# Patient Record
Sex: Female | Born: 1950 | Race: White | Hispanic: No | Smoking: Current every day smoker
Health system: Southern US, Community
[De-identification: ages and names within clinical notes are randomized; demographics above are authoritative.]

## PROBLEM LIST (undated history)

## (undated) DIAGNOSIS — D126 Benign neoplasm of colon, unspecified: Secondary | ICD-10-CM

## (undated) DIAGNOSIS — G8929 Other chronic pain: Secondary | ICD-10-CM

## (undated) DIAGNOSIS — F329 Major depressive disorder, single episode, unspecified: Secondary | ICD-10-CM

## (undated) DIAGNOSIS — R0600 Dyspnea, unspecified: Secondary | ICD-10-CM

## (undated) DIAGNOSIS — J449 Chronic obstructive pulmonary disease, unspecified: Secondary | ICD-10-CM

## (undated) DIAGNOSIS — F32A Depression, unspecified: Secondary | ICD-10-CM

## (undated) DIAGNOSIS — R51 Headache: Secondary | ICD-10-CM

## (undated) DIAGNOSIS — K222 Esophageal obstruction: Secondary | ICD-10-CM

## (undated) DIAGNOSIS — F419 Anxiety disorder, unspecified: Secondary | ICD-10-CM

## (undated) DIAGNOSIS — A809 Acute poliomyelitis, unspecified: Secondary | ICD-10-CM

## (undated) DIAGNOSIS — G14 Postpolio syndrome: Secondary | ICD-10-CM

## (undated) DIAGNOSIS — M1990 Unspecified osteoarthritis, unspecified site: Secondary | ICD-10-CM

## (undated) DIAGNOSIS — R011 Cardiac murmur, unspecified: Secondary | ICD-10-CM

## (undated) DIAGNOSIS — M797 Fibromyalgia: Secondary | ICD-10-CM

## (undated) DIAGNOSIS — I10 Essential (primary) hypertension: Secondary | ICD-10-CM

## (undated) DIAGNOSIS — K219 Gastro-esophageal reflux disease without esophagitis: Secondary | ICD-10-CM

## (undated) HISTORY — PX: ABDOMINAL HYSTERECTOMY: SHX81

## (undated) HISTORY — DX: Headache: R51

## (undated) HISTORY — DX: Chronic obstructive pulmonary disease, unspecified: J44.9

## (undated) HISTORY — DX: Acute poliomyelitis, unspecified: A80.9

## (undated) HISTORY — DX: Esophageal obstruction: K22.2

## (undated) HISTORY — PX: PARTIAL HYSTERECTOMY: SHX80

## (undated) HISTORY — DX: Anxiety disorder, unspecified: F41.9

## (undated) HISTORY — DX: Benign neoplasm of colon, unspecified: D12.6

## (undated) HISTORY — DX: Other chronic pain: G89.29

---

## 2001-09-25 ENCOUNTER — Emergency Department (HOSPITAL_COMMUNITY): Admission: EM | Admit: 2001-09-25 | Discharge: 2001-09-26 | Payer: Self-pay | Admitting: Emergency Medicine

## 2001-09-26 ENCOUNTER — Encounter: Payer: Self-pay | Admitting: Emergency Medicine

## 2002-11-26 ENCOUNTER — Encounter (HOSPITAL_COMMUNITY): Admission: RE | Admit: 2002-11-26 | Discharge: 2002-12-26 | Payer: Self-pay | Admitting: Family Medicine

## 2002-12-02 ENCOUNTER — Encounter: Payer: Self-pay | Admitting: Family Medicine

## 2002-12-12 ENCOUNTER — Encounter: Payer: Self-pay | Admitting: *Deleted

## 2002-12-12 ENCOUNTER — Ambulatory Visit (HOSPITAL_COMMUNITY): Admission: RE | Admit: 2002-12-12 | Discharge: 2002-12-12 | Payer: Self-pay | Admitting: *Deleted

## 2002-12-18 ENCOUNTER — Ambulatory Visit (HOSPITAL_COMMUNITY): Admission: RE | Admit: 2002-12-18 | Discharge: 2002-12-18 | Payer: Self-pay | Admitting: *Deleted

## 2003-11-17 ENCOUNTER — Ambulatory Visit (HOSPITAL_COMMUNITY): Admission: RE | Admit: 2003-11-17 | Discharge: 2003-11-17 | Payer: Self-pay | Admitting: Family Medicine

## 2004-07-03 ENCOUNTER — Emergency Department (HOSPITAL_COMMUNITY): Admission: EM | Admit: 2004-07-03 | Discharge: 2004-07-03 | Payer: Self-pay | Admitting: Emergency Medicine

## 2004-10-23 ENCOUNTER — Emergency Department (HOSPITAL_COMMUNITY): Admission: EM | Admit: 2004-10-23 | Discharge: 2004-10-23 | Payer: Self-pay | Admitting: Emergency Medicine

## 2005-02-15 ENCOUNTER — Ambulatory Visit (HOSPITAL_COMMUNITY): Admission: RE | Admit: 2005-02-15 | Discharge: 2005-02-15 | Payer: Self-pay | Admitting: Family Medicine

## 2005-02-17 ENCOUNTER — Ambulatory Visit (HOSPITAL_COMMUNITY): Admission: RE | Admit: 2005-02-17 | Discharge: 2005-02-17 | Payer: Self-pay | Admitting: Family Medicine

## 2005-10-02 DIAGNOSIS — D126 Benign neoplasm of colon, unspecified: Secondary | ICD-10-CM

## 2005-10-02 HISTORY — PX: COLONOSCOPY: SHX174

## 2005-10-02 HISTORY — DX: Benign neoplasm of colon, unspecified: D12.6

## 2006-05-29 ENCOUNTER — Ambulatory Visit (HOSPITAL_COMMUNITY): Admission: RE | Admit: 2006-05-29 | Discharge: 2006-05-29 | Payer: Self-pay | Admitting: Family Medicine

## 2006-08-03 ENCOUNTER — Encounter (INDEPENDENT_AMBULATORY_CARE_PROVIDER_SITE_OTHER): Payer: Self-pay | Admitting: Specialist

## 2006-08-03 ENCOUNTER — Ambulatory Visit (HOSPITAL_COMMUNITY): Admission: RE | Admit: 2006-08-03 | Discharge: 2006-08-03 | Payer: Self-pay | Admitting: Internal Medicine

## 2006-08-03 ENCOUNTER — Ambulatory Visit: Payer: Self-pay | Admitting: Internal Medicine

## 2009-07-02 DIAGNOSIS — K222 Esophageal obstruction: Secondary | ICD-10-CM

## 2009-07-02 HISTORY — DX: Esophageal obstruction: K22.2

## 2009-07-19 ENCOUNTER — Ambulatory Visit (HOSPITAL_COMMUNITY): Admission: RE | Admit: 2009-07-19 | Discharge: 2009-07-19 | Payer: Self-pay | Admitting: Family Medicine

## 2009-07-21 ENCOUNTER — Encounter: Payer: Self-pay | Admitting: Gastroenterology

## 2009-07-23 ENCOUNTER — Ambulatory Visit: Payer: Self-pay | Admitting: Gastroenterology

## 2009-07-23 ENCOUNTER — Encounter: Payer: Self-pay | Admitting: Gastroenterology

## 2009-07-23 ENCOUNTER — Ambulatory Visit (HOSPITAL_COMMUNITY): Admission: RE | Admit: 2009-07-23 | Discharge: 2009-07-23 | Payer: Self-pay | Admitting: Gastroenterology

## 2009-07-23 HISTORY — PX: ESOPHAGOGASTRODUODENOSCOPY: SHX1529

## 2009-07-26 ENCOUNTER — Ambulatory Visit (HOSPITAL_COMMUNITY): Admission: RE | Admit: 2009-07-26 | Discharge: 2009-07-26 | Payer: Self-pay | Admitting: Family Medicine

## 2009-07-27 ENCOUNTER — Encounter (INDEPENDENT_AMBULATORY_CARE_PROVIDER_SITE_OTHER): Payer: Self-pay

## 2009-07-28 ENCOUNTER — Ambulatory Visit (HOSPITAL_COMMUNITY): Admission: RE | Admit: 2009-07-28 | Discharge: 2009-07-28 | Payer: Self-pay | Admitting: Family Medicine

## 2009-08-09 ENCOUNTER — Ambulatory Visit (HOSPITAL_COMMUNITY): Admission: RE | Admit: 2009-08-09 | Discharge: 2009-08-09 | Payer: Self-pay | Admitting: Family Medicine

## 2009-08-11 ENCOUNTER — Ambulatory Visit (HOSPITAL_COMMUNITY): Admission: RE | Admit: 2009-08-11 | Discharge: 2009-08-11 | Payer: Self-pay | Admitting: Family Medicine

## 2009-08-11 ENCOUNTER — Encounter (INDEPENDENT_AMBULATORY_CARE_PROVIDER_SITE_OTHER): Payer: Self-pay | Admitting: Diagnostic Radiology

## 2009-08-18 DIAGNOSIS — J441 Chronic obstructive pulmonary disease with (acute) exacerbation: Secondary | ICD-10-CM | POA: Insufficient documentation

## 2009-08-18 DIAGNOSIS — F17200 Nicotine dependence, unspecified, uncomplicated: Secondary | ICD-10-CM

## 2009-08-18 DIAGNOSIS — R634 Abnormal weight loss: Secondary | ICD-10-CM

## 2009-08-18 DIAGNOSIS — R079 Chest pain, unspecified: Secondary | ICD-10-CM | POA: Insufficient documentation

## 2009-08-24 ENCOUNTER — Ambulatory Visit: Payer: Self-pay | Admitting: Gastroenterology

## 2009-08-24 DIAGNOSIS — Z860100 Personal history of colon polyps, unspecified: Secondary | ICD-10-CM | POA: Insufficient documentation

## 2009-08-24 DIAGNOSIS — K222 Esophageal obstruction: Secondary | ICD-10-CM

## 2010-03-18 ENCOUNTER — Encounter: Payer: Self-pay | Admitting: Gastroenterology

## 2010-04-12 ENCOUNTER — Emergency Department (HOSPITAL_COMMUNITY): Admission: EM | Admit: 2010-04-12 | Discharge: 2010-04-12 | Payer: Self-pay | Admitting: Emergency Medicine

## 2010-10-23 ENCOUNTER — Encounter: Payer: Self-pay | Admitting: Family Medicine

## 2010-11-01 NOTE — Medication Information (Signed)
Summary: RX Folder  RX Folder   Imported By: Peggyann Shoals 03/18/2010 11:49:37  _____________________________________________________________________  External Attachment:    Type:   Image     Comment:   External Document  Appended Document: RX Folder-omeprazole    Prescriptions: OMEPRAZOLE 20 MG CPDR (OMEPRAZOLE) one by mouth 30 mins before breakfast daily  #30 x 11   Entered and Authorized by:   Leanna Battles. Dixon Boos   Signed by:   Leanna Battles Arilyn Brierley PA-C on 03/18/2010   Method used:   Printed then faxed to ...       [facility] [address] (retail)       [address]       [place], [place]  [address]       Ph: [phone]       Fax:    RxID:   [id number]    NEEDS TO BE FAXED TO [facility], [place]  Appended Document: RX Folder Faxed Rx to [facility].

## 2011-02-17 NOTE — Procedures (Signed)
   Amy Rubio, Amy Rubio                           ACCOUNT NO.:  [id number]   MEDICAL RECORD NO.:  [id number]                   PATIENT TYPE:  OUT   LOCATION:  RAD                                  FACILITY:  [facility]   PHYSICIAN:  Vida Roller, M.D.                DATE OF BIRTH:  04-23-1951   DATE OF PROCEDURE:  12/12/2002  DATE OF DISCHARGE:                                  ECHOCARDIOGRAM   TAPE:  [id number], tape count [phone].   CARDIOLOGIST:  Vida Roller, M.D.   HISTORY:  This is a 60 year old woman born [birth year], with a murmur and a  history of hypertension who was evaluated.  The technical quality of the  study was good.   M-MODE MEASUREMENTS:  1. The aortic root measures 27 mm.  2. The left atrium measures 34 mm.  3. The septum is 11 mm.  4. The posterior wall is 11 mm.  5. The left ventricular diastolic dimension is 49 mm.  6. The left ventricular systolic dimension is 34 mm.   2D AND DOPPLER IMAGING:  1. The left ventricle is normal size with normal systolic function.  There     are no wall motion abnormalities seen.  Diastolic function was not     assessed.  2. The right ventricle is normal size with normal systolic function.  3. Both atria are normal size.  4. The aortic valve is mildly sclerotic with evidence of mild aortic     insufficiency.  No stenosis is seen.  5. The mitral valve is morphologically unremarkable with no stenosis or     regurgitation.  6. The tricuspid valve is morphologically unremarkable with trace tricuspid     regurgitation.  No stenosis is seen.  7. The pulmonic valve was not well seen.  8. The ascending aorta looks enlarged on two views, full assessment of the     aorta was not performed.  Further evaluation with CT scan or magnetic     resonance imaging is recommended.  9. The pericardial structures appear normal.                                               Vida Roller, M.D.    JH/MEDQ  D:  12/12/2002  T:  12/13/2002  Job:   [id number]

## 2011-02-17 NOTE — Cardiovascular Report (Signed)
NAMECRICKET, Amy Rubio NO.:  [id number]   MEDICAL RECORD NO.:  [id number]                   PATIENT TYPE:  OIB   LOCATION:                                       FACILITY:  [facility]   PHYSICIAN:  Vida Roller, M.D.                DATE OF BIRTH:  [birth year]   DATE OF PROCEDURE:  DATE OF DISCHARGE:                              CARDIAC CATHETERIZATION   REFERRING PHYSICIAN:  Dr. Lilyan Punt.   I am her primary cardiologist.   HISTORY OF PRESENT ILLNESS:  Amy Rubio is a 59 year old white female with a  significant tobacco history who had atypical chest discomfort, was evaluated  by her primary care physician, underwent a perfusion imaging study which  showed an anterior defect with normal LV function, questionable for ischemia  and she was referred for elective heart catheterization.   DESCRIPTION OF PROCEDURE:  After obtaining informed consent the patient was  brought to the cardiac catheterization laboratory in the fasting state.  There she was prepped and draped in the usual sterile manner and the right  groin appeared to have an ingrown hair with a small area of induration.  So  the decision was made to use the left groin which was also prepped and  draped in the usual sterile manner and anesthetized with 1% lidocaine  without epinephrine.  The left femoral artery was cannulated using the  modified Seldinger technique with a 6 French 10 cm sheath and left heart  catheterization was performed using a 6 French Judkins left #4, a 6 French  Judkins right #4 and a 6 French angled pigtail catheter which was advanced  under fluoroscopic guidance in the ascending aorta.  The __________ was  prolapsed across the aortic valve connected to a pressure tracing system  where pressure monitoring was performed.  It was then reconnected to the  power objector where a left ventriculogram was imaged in the 30 degree RAO  view.  Power ejection was performed at 12 mL a  second for a total of 36 mL  of dye at a 1/2 second rate of rise 600 PSI.  At the conclusion of the left  ventriculogram the catheter was removed.  The patient was moved to the  cardiac holding area where the left femoral artery sheath was removed.  Hemostasis was obtained using direct manual pressure.  At the conclusion of  the hold there was no evidence of ecchymosis or hematoma formation.  Her  distal pulses were intact.  Total fluoroscopic time was 2.7 minutes, total  iodinized contrast was 100 mL of material.   RESULTS:  The aortic pressure was measured at 151/61 with a mean arterial  pressure of 92 mm Hg.   The left ventricular pressure was measured at 152/5 with an end diastolic  pressure of 15 mm Hg.   Selective coronary angiography:  The left main  coronary artery is a normal  sized artery with minimal luminal irregularities.  The left anterior  descending coronary artery is a moderate caliber vessel with significant  calcium in its proximal portion.  There are two diagonals.  The first  diagonal is a moderate size vessel which bifurcates and the second diagonal  was small.  There is only minimal luminal irregularities along the course of  the left anterior descending.  The circumflex coronary artery is a moderate  caliber vessel which gives off one small obtuse marginal high and then  another small obtuse marginal in the mid portion and finally a moderate  caliber obtuse marginal at the distal portion of the circumflex.  There is  luminal irregularity in the circumflex distribution.   The right coronary artery is a dominant vessel.  It is a small caliber  vessel with a small caliber posterior descending coronary artery and several  branching posterior lateral branches.  There are luminal irregularities  along the entire course of the right coronary artery.   Left ventriculography reveals preserved left ventricular ejection fraction  at 55% with no significant wall motion  abnormalities.  There is no mitral  regurgitation seen.   ASSESSMENT:  This is a woman with nonobstructive coronary artery disease and  a false positive perfusion imaging study.  She does have enough  nonobstructive coronary artery disease to be concerned regarding secondary  prophylaxis.                                               Vida Roller, M.D.    JH/MEDQ  D:  12/18/2002  T:  12/19/2002  Job:  [id number]

## 2011-02-17 NOTE — Procedures (Signed)
NAMEDAYLANI, DEBLOIS                 ACCOUNT NO.:  [id number]   MEDICAL RECORD NO.:  [id number]          PATIENT TYPE:  OUT   LOCATION:  RESP                          FACILITY:  [facility]   PHYSICIAN:  Edward L. Juanetta Gosling, M.D.DATE OF BIRTH:  Dec 03, 1950   DATE OF PROCEDURE:  02/17/2005  DATE OF DISCHARGE:  02/17/2005                              PULMONARY FUNCTION TEST   1. Spirometry shows a mild ventilatory defect but with fairly significant      airflow obstruction.  2. Lung volumes showed normal total lung capacity but increased residual      volume suggesting air trapping on the basis of obstructive disease.  3. DLCO is normal.  4. Arterial blood gases were normal. With DLCO being normal at 80% which      is the lower limit of normal, this would be more suggestive at least of      a reversible airflow obstruction such as asthma.        ELH/MEDQ  D:  02/22/2005  T:  02/22/2005  Job:  [id number]   cc:   Lorin Picket A. Gerda Diss, MD  [address]  [place]  [place] [address]  Fax: [phone]

## 2011-02-17 NOTE — Op Note (Signed)
[name], Amy Rubio                 ACCOUNT NO.:  [id number]   MEDICAL RECORD NO.:  [id number]          PATIENT TYPE:  AMB   LOCATION:  DAY                           FACILITY:  [facility]   PHYSICIAN:  R. Roetta Sessions, M.D. DATE OF BIRTH:  [birth year]   DATE OF PROCEDURE:  08/03/2006  DATE OF DISCHARGE:                                 OPERATIVE REPORT   PROCEDURE:  Colonoscopy, ablation and snare polypectomy.   INDICATIONS FOR PROCEDURE:  The patient is a 60 year old lady sent over for  colorectal cancer screening at the courtesy of Dr. Lilyan Punt.  She has  hematochezia from time to time.  She is devoid of any lower GI tract  symptoms. Family history is most significant in that her father was  diagnosed colorectal cancer in his 34s.  Colonoscopy is now being done.  This approach has been discussed with the patient at length.  The potential  risks, benefits and alternatives have been reviewed, questions answered, and  she is agreeable.  Please see documentation in the medical record.   PROCEDURE:  O2 saturation, blood pressure, pulse rate were monitored  throughout the entire procedure.  Conscious sedation with IV Versed and  Demerol incrementally.   INSTRUMENT USED:  Olympus video chip pediatric colonoscope.   FINDINGS:  Digital rectal exam revealed no abnormalities.  The prep was  adequate.   Rectum:  Examination of the rectal mucosa via the retroflex view of the anal  verge revealed a 1.25-cm angry pedunculated polyp at 10 cm from the anal  verge. There were a couple of diminutive polyps just inside the anal verge,  as well.   Colon:  The colonic mucosa was surveyed from the rectosigmoid junction  through the left transverse right colon to the appendiceal orifice,  ileocecal valve, and cecum.  These structures were well seen and  photographed for the record.  From this level, the scope was withdrawn.  All  previous mucosal surfaces were again seen. The patient's colon was  elongated  and tortuous requiring a number of maneuvers including changing of the  patient's position and abdominal pressure to reach the cecum.  The patient  has scattered left-sided diverticula.  The remainder of the colonic mucosa  appeared normal.  The pedunculated polyp in the rectum was removed with hot  snare cautery and recovered through the scope.  The diminutive polyps were  ablated with the tip of the snare cautery unit.  The patient tolerated the  procedure and was reacted in endoscopy.   IMPRESSION:  1. Pedunculated rectal polyp removed as described above.  Multiple      diminutive polyps ablated with the tip of the snare cautery unit,      otherwise, normal rectum.  2. Scattered left sided diverticula, elongated and redundant but otherwise      normal appearing colon.   RECOMMENDATIONS:  1. No aspirin for the next ten days.  2. Follow-up on path.  3. Further recommendations to follow.      Jonathon Bellows, M.D.  Electronically Signed     RMR/MEDQ  D:  08/03/2006  T:  08/03/2006  Job:  [id number]

## 2011-02-17 NOTE — Procedures (Signed)
   Amy Rubio, BAUER NO.:  [id number]   MEDICAL RECORD NO.:  [id number]                  PATIENT TYPE:   LOCATION:                                       FACILITY:   PHYSICIAN:  Donna Bernard, M.D.             DATE OF BIRTH:   DATE OF PROCEDURE:  12/02/2002  DATE OF DISCHARGE:                                    STRESS TEST   INDICATION FOR TEST:  The patient is a 3-ish-year-old white female with  history of chronic smoking and hypertension with atypical chest pain.  Stress test was done of the purpose of risk stratification.  The patient is  on Ziac chronically for her blood pressure.  Of note, she does have some  bradycardia, at rest, with heart rate in the mid-50s having been off the  Ziac, now, for 4-5 days.   INTERPRETATION:  Resting EKG did reveal some atypical changes with  flattening of the ST segment with approximately 0.5 mm depression.  The  patient tolerated the first stage relatively well with minimal  electrocardiographic changes in her stress test.  During the course of the  second stage she developed some progressive fatigue and shortness of breath.  She had a normal hypertensive response to exercise as expected.  Into the  third stage there was a significant drop of her ST segment both in the  anterior leads and in the lateral chest leads with her fulfilling  electrocardiographic criteria for a potential ischemia.   IMPRESSION:  Positive adequate stress test.  Of note, with her baseline ST  changes she is more prone to have a false positive electrocardiographic  result.   PLAN:  Await Cardiolite test for delineation of true risk.                                               Donna Bernard, M.D.    Karie Chimera  D:  12/02/2002  T:  12/02/2002  Job:  [id number]

## 2011-06-29 ENCOUNTER — Encounter: Payer: Self-pay | Admitting: Internal Medicine

## 2011-08-15 ENCOUNTER — Other Ambulatory Visit: Payer: Self-pay | Admitting: Family Medicine

## 2011-08-15 ENCOUNTER — Ambulatory Visit (HOSPITAL_COMMUNITY)
Admission: RE | Admit: 2011-08-15 | Discharge: 2011-08-15 | Disposition: A | Discharge status: Home / Self Care | Payer: Medicare Other | Source: Ambulatory Visit | Attending: Family Medicine | Admitting: Family Medicine

## 2011-08-15 DIAGNOSIS — J449 Chronic obstructive pulmonary disease, unspecified: Secondary | ICD-10-CM

## 2011-08-15 DIAGNOSIS — R05 Cough: Secondary | ICD-10-CM | POA: Insufficient documentation

## 2011-08-15 DIAGNOSIS — R059 Cough, unspecified: Secondary | ICD-10-CM | POA: Insufficient documentation

## 2011-08-15 DIAGNOSIS — J4489 Other specified chronic obstructive pulmonary disease: Secondary | ICD-10-CM | POA: Insufficient documentation

## 2011-08-15 DIAGNOSIS — F17200 Nicotine dependence, unspecified, uncomplicated: Secondary | ICD-10-CM | POA: Insufficient documentation

## 2011-08-17 ENCOUNTER — Ambulatory Visit (HOSPITAL_COMMUNITY)
Admission: RE | Admit: 2011-08-17 | Discharge: 2011-08-17 | Disposition: A | Discharge status: Home / Self Care | Payer: Medicare Other | Source: Ambulatory Visit | Attending: Family Medicine | Admitting: Family Medicine

## 2011-08-17 ENCOUNTER — Other Ambulatory Visit: Payer: Self-pay | Admitting: Family Medicine

## 2011-08-17 DIAGNOSIS — J449 Chronic obstructive pulmonary disease, unspecified: Secondary | ICD-10-CM | POA: Insufficient documentation

## 2011-08-17 DIAGNOSIS — J984 Other disorders of lung: Secondary | ICD-10-CM | POA: Insufficient documentation

## 2011-08-17 DIAGNOSIS — F17200 Nicotine dependence, unspecified, uncomplicated: Secondary | ICD-10-CM | POA: Insufficient documentation

## 2011-08-17 DIAGNOSIS — J4489 Other specified chronic obstructive pulmonary disease: Secondary | ICD-10-CM | POA: Insufficient documentation

## 2011-09-11 ENCOUNTER — Other Ambulatory Visit: Payer: Self-pay | Admitting: Family Medicine

## 2011-09-11 DIAGNOSIS — R911 Solitary pulmonary nodule: Secondary | ICD-10-CM

## 2011-09-14 ENCOUNTER — Encounter (HOSPITAL_COMMUNITY): Payer: Self-pay

## 2011-09-14 ENCOUNTER — Ambulatory Visit (HOSPITAL_COMMUNITY)
Admission: RE | Admit: 2011-09-14 | Discharge: 2011-09-14 | Disposition: A | Discharge status: Home / Self Care | Payer: Medicare Other | Source: Ambulatory Visit | Attending: Family Medicine | Admitting: Family Medicine

## 2011-09-14 DIAGNOSIS — R911 Solitary pulmonary nodule: Secondary | ICD-10-CM

## 2011-09-14 DIAGNOSIS — R0602 Shortness of breath: Secondary | ICD-10-CM | POA: Insufficient documentation

## 2011-09-14 DIAGNOSIS — R933 Abnormal findings on diagnostic imaging of other parts of digestive tract: Secondary | ICD-10-CM | POA: Insufficient documentation

## 2011-09-14 DIAGNOSIS — J984 Other disorders of lung: Secondary | ICD-10-CM | POA: Insufficient documentation

## 2011-09-14 HISTORY — DX: Essential (primary) hypertension: I10

## 2011-09-21 ENCOUNTER — Telehealth: Payer: Self-pay | Admitting: Internal Medicine

## 2011-09-21 NOTE — Telephone Encounter (Signed)
Tried to reach pt [facility]

## 2011-09-21 NOTE — Telephone Encounter (Signed)
Dr. Lilyan Punt called me about this patient. Serial chest CT demonstrated worsening of abnormal  thickened distal esophagus. Prior EGD demonstrated esophagitis with stricture requiring dilation. This abnormality on CT has gotten progressively worse. No new GI symptoms per Dr. Gerda Diss; Radiologist recommended further evaluation. I have reviewed the chest CT images .The best way to get it this is to perform another  EGD. Dr. Gerda Diss asked me to see this patient.  Patient to be done in January 2013

## 2011-09-21 NOTE — Telephone Encounter (Signed)
Per RMR pt needs ov. Please schedule. Thanks.

## 2011-09-27 NOTE — Telephone Encounter (Signed)
Spoke to Patients daughter and told her to tell Amy Rubio to please call the office

## 2011-10-03 HISTORY — PX: ESOPHAGOGASTRODUODENOSCOPY: SHX1529

## 2011-10-06 ENCOUNTER — Encounter: Payer: Self-pay | Admitting: Internal Medicine

## 2011-10-06 NOTE — Telephone Encounter (Signed)
Pt is aware of OV on 1/14 at 0800 with [facility] and appt card was mailed

## 2011-10-16 ENCOUNTER — Ambulatory Visit: Payer: Medicare Other | Admitting: Gastroenterology

## 2011-10-26 ENCOUNTER — Encounter: Payer: Self-pay | Admitting: Gastroenterology

## 2011-10-30 ENCOUNTER — Encounter: Payer: Self-pay | Admitting: Gastroenterology

## 2011-10-30 ENCOUNTER — Ambulatory Visit (INDEPENDENT_AMBULATORY_CARE_PROVIDER_SITE_OTHER): Payer: Medicare Other | Admitting: Gastroenterology

## 2011-10-30 VITALS — BP 162/64 | HR 59 | Temp 98.1°F | Ht 65.0 in | Wt 129.4 lb

## 2011-10-30 DIAGNOSIS — Z86010 Personal history of colonic polyps: Secondary | ICD-10-CM | POA: Diagnosis not present

## 2011-10-30 DIAGNOSIS — K222 Esophageal obstruction: Secondary | ICD-10-CM

## 2011-10-30 DIAGNOSIS — Z800 Family history of malignant neoplasm of digestive organs: Secondary | ICD-10-CM

## 2011-10-30 DIAGNOSIS — R1314 Dysphagia, pharyngoesophageal phase: Secondary | ICD-10-CM

## 2011-10-30 DIAGNOSIS — R1310 Dysphagia, unspecified: Secondary | ICD-10-CM | POA: Insufficient documentation

## 2011-10-30 DIAGNOSIS — R933 Abnormal findings on diagnostic imaging of other parts of digestive tract: Secondary | ICD-10-CM

## 2011-10-30 NOTE — Progress Notes (Signed)
Faxed to PCP
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
## 2011-10-30 NOTE — Assessment & Plan Note (Signed)
Family history of colon cancer, father, and personal history of adenomatous colon polyps. Overdue for surveillance colonoscopy. She is not ready at this time to schedule colonoscopy. She will call as when she is ready but we will send another reminder letter in 6 months. Given personal and her family history I have encouraged her not to postpone colonoscopy much longer. She voiced understanding.

## 2011-10-30 NOTE — Assessment & Plan Note (Signed)
Abnormal esophagus on recent chest CT, worse from 2010 CT. Esophageal wall prominence with proximal dilatation. She had EGD with dilation in 2010. Patient describes esophageal dysphagia with intermittent food impactions. No typical heartburn. Recently started omeprazole 20 mg daily. Given CT abnormalities and symptoms, would recommend EGD with esophageal dilation in the near future. Will augment conscious sedation with Phenergan 12.5 mg IV 30 minutes prior to procedure.  I have discussed the risks, alternatives, benefits with regards to but not limited to the risk of reaction to medication, bleeding, infection, perforation and the patient is agreeable to proceed. Written consent to be obtained.

## 2011-10-30 NOTE — Patient Instructions (Signed)
Continue omeprazole daily. We have scheduled you for an upper endoscopy with Dr. Jena Gauss. See separate instructions.  Please call when you are ready for your colonoscopy. You were due last year.

## 2011-11-07 ENCOUNTER — Encounter (HOSPITAL_COMMUNITY): Payer: Self-pay | Admitting: Pharmacy Technician

## 2011-11-20 MED ORDER — SODIUM CHLORIDE 0.45 % IV SOLN
Freq: Once | INTRAVENOUS | Status: AC
  Administered 2011-11-21: 12:00:00 via INTRAVENOUS

## 2011-11-21 ENCOUNTER — Encounter (HOSPITAL_COMMUNITY)
Admission: RE | Disposition: A | Discharge status: Home Health | Payer: Self-pay | Source: Ambulatory Visit | Attending: Internal Medicine

## 2011-11-21 ENCOUNTER — Other Ambulatory Visit: Payer: Self-pay | Admitting: Internal Medicine

## 2011-11-21 ENCOUNTER — Encounter (HOSPITAL_COMMUNITY): Payer: Self-pay | Admitting: *Deleted

## 2011-11-21 ENCOUNTER — Ambulatory Visit (HOSPITAL_COMMUNITY)
Admission: RE | Admit: 2011-11-21 | Discharge: 2011-11-21 | Disposition: A | Discharge status: Home Health | Payer: Medicare Other | Source: Ambulatory Visit | Attending: Internal Medicine | Admitting: Internal Medicine

## 2011-11-21 DIAGNOSIS — R1310 Dysphagia, unspecified: Secondary | ICD-10-CM

## 2011-11-21 DIAGNOSIS — K219 Gastro-esophageal reflux disease without esophagitis: Secondary | ICD-10-CM | POA: Diagnosis not present

## 2011-11-21 DIAGNOSIS — K449 Diaphragmatic hernia without obstruction or gangrene: Secondary | ICD-10-CM | POA: Diagnosis not present

## 2011-11-21 DIAGNOSIS — R933 Abnormal findings on diagnostic imaging of other parts of digestive tract: Secondary | ICD-10-CM | POA: Diagnosis not present

## 2011-11-21 DIAGNOSIS — Q391 Atresia of esophagus with tracheo-esophageal fistula: Secondary | ICD-10-CM | POA: Insufficient documentation

## 2011-11-21 DIAGNOSIS — Q393 Congenital stenosis and stricture of esophagus: Secondary | ICD-10-CM | POA: Insufficient documentation

## 2011-11-21 DIAGNOSIS — K222 Esophageal obstruction: Secondary | ICD-10-CM

## 2011-11-21 SURGERY — ESOPHAGOGASTRODUODENOSCOPY (EGD) WITH ESOPHAGEAL DILATION
Anesthesia: Moderate Sedation

## 2011-11-21 MED ORDER — MIDAZOLAM HCL 5 MG/5ML IJ SOLN
INTRAMUSCULAR | Status: DC | PRN
  Administered 2011-11-21 (×2): 2 mg via INTRAVENOUS

## 2011-11-21 MED ORDER — MEPERIDINE HCL 100 MG/ML IJ SOLN
INTRAMUSCULAR | Status: AC
  Filled 2011-11-21: qty 2

## 2011-11-21 MED ORDER — PROMETHAZINE HCL 25 MG/ML IJ SOLN
12.5000 mg | Freq: Once | INTRAMUSCULAR | Status: AC
  Administered 2011-11-21: 12.5 mg via INTRAVENOUS

## 2011-11-21 MED ORDER — BUTAMBEN-TETRACAINE-BENZOCAINE 2-2-14 % EX AERO
INHALATION_SPRAY | CUTANEOUS | Status: DC | PRN
  Administered 2011-11-21: 2 via TOPICAL

## 2011-11-21 MED ORDER — SODIUM CHLORIDE 0.9 % IJ SOLN
INTRAMUSCULAR | Status: AC
  Filled 2011-11-21: qty 10

## 2011-11-21 MED ORDER — PROMETHAZINE HCL 25 MG/ML IJ SOLN
INTRAMUSCULAR | Status: AC
  Filled 2011-11-21: qty 1

## 2011-11-21 MED ORDER — MEPERIDINE HCL 100 MG/ML IJ SOLN
INTRAMUSCULAR | Status: DC | PRN
  Administered 2011-11-21: 50 mg via INTRAVENOUS
  Administered 2011-11-21: 25 mg via INTRAVENOUS

## 2011-11-21 MED ORDER — STERILE WATER FOR IRRIGATION IR SOLN
Status: DC | PRN
  Administered 2011-11-21: 14:00:00

## 2011-11-21 MED ORDER — MIDAZOLAM HCL 5 MG/5ML IJ SOLN
INTRAMUSCULAR | Status: AC
  Filled 2011-11-21: qty 10
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
## 2011-11-21 NOTE — Op Note (Signed)
[facility] [address] [place], [place]  [address]  ENDOSCOPY PROCEDURE REPORT  PATIENT:  Amy Rubio, Amy Rubio  MR#:  [id number] BIRTHDATE:  [birth year], 60 yrs. old  GENDER:  female  ENDOSCOPIST:  R. Roetta Sessions, MD Caleen Essex Referred by:  Lilyan Punt, M.D.  PROCEDURE DATE:  11/21/2011 PROCEDURE:  EGD with [name] dilation, esophageal biopsy  INDICATIONS:  esophageal dysphagia -- recurrent. History of a thickened esophagus seen on chest CT  INFORMED CONSENT:   The risks, benefits, limitations, alternatives and imponderables have been discussed.  The potential for biopsy, esophogeal dilation, etc. have also been reviewed.  Questions have been answered.  All parties agreeable.  Please see the history and physical in the medical record for more information.  MEDICATIONS:     Phenergan 12.5 mg IV, Demerol 75 mg IV and Versed 4 mg IV in divided doses. Cetacaine spray.  DESCRIPTION OF PROCEDURE:   The EG-[id number] ([id number]) endoscope was introduced through the mouth and advanced to the second portion of the duodenum without difficulty or limitations.  The mucosal surfaces were surveyed very carefully during advancement of the scope and upon withdrawal.  Retroflexion view of the proximal stomach and esophagogastric junction was performed.  <<PROCEDUREIMAGES>>  FINDINGS:   grossly normal-appearing tubular esophagus. Normal appearing esophageal mucosa EG junction easily traversed; stomach empty. Small hiatal hernia; otherwise, gastric mucosa appeared normal. Patent pylorus. Normal first and second portion of the duodenum.  THERAPEUTIC / DIAGNOSTIC MANEUVERS PERFORMED:   A 56 French Maloney dilator was passed fully with ease.  A look  back revealed a small inlet patch and a disruption of the mucosa throug the EUS suggestive of an occult cervical web.  Subsequently,  biopsies of the esophagus were taken.  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 Small hilar hernia.  RECOMMENDATIONS:  Continue omeprazole 20 mg daily. Follow up on pathology.  ______________________________ R. Roetta Sessions, MD Caleen Essex  CC:  n. eSIGNED:   R. Roetta Sessions at 11/21/2011 02:03 PM  Maryann Conners, [phone]

## 2011-11-21 NOTE — Discharge Instructions (Signed)
Esophageal Dilatation The esophagus is the long, narrow tube which carries food and liquid from the mouth to the stomach. Esophageal dilatation is the technique used to stretch a blocked or narrowed portion of the esophagus. This procedure is used when a part of the esophagus has become so narrow that it becomes difficult, painful or even impossible to swallow. This is generally an uncomplicated form of treatment. When this is not successful, chest surgery may be required. This is a much more extensive form of treatment with a longer recovery time. CAUSES  Some of the more common causes of blockage or strictures of the esophagus are:  Narrowing from longstanding inflammation (soreness and redness) of the lower esophagus. This comes from the constant exposure of the lower esophagus to the acid which bubbles up from the stomach. Over time this causes scarring and narrowing of the lower esophagus.   Hiatal hernia in which a small part of the stomach bulges (herniates) up through the diaphragm. This can cause a gradual narrowing of the end of the esophagus.   Schatzki's Ring is a narrow ring of benign (non-cancerous) fibrous tissue which constricts the lower esophagus. The reason for this is not known.   Scleroderma is a connective tissue disorder that affects the esophagus and makes swallowing difficult.   Achalasia is an absence of nerves to the lower esophagus and to the esophageal sphincter. This is the circular muscle between the stomach and esophagus that relaxes to allow food into the stomach. After swallowing, it contracts to keep food in the stomach. This absence of nerves may be congenital (present since birth). This can cause irregular spasms of the lower esophageal muscle. This spasm does not open up to allow food and fluid through. The result is a persistent blockage with subsequent slow trickling of the esophageal contents into the stomach.   Strictures may develop from swallowing materials  which damage the esophagus. Some examples are strong acids or alkalis such as lye.   Growths such as benign (non-cancerous) and malignant (cancerous) tumors can block the esophagus.   Heredity (present since birth) causes.  DIAGNOSIS  Your caregiver often suspects this problem by taking a medical history. They will also do a physical exam. They can then prove their suspicions using X-rays and endoscopy. Endoscopy is an exam in which a tube like a small flexible telescope is used to look at your esophagus.  TREATMENT There are different stretching (dilating) techniques which can be used. Simple bougie dilatation may be done in the office. This usually takes only a couple minutes. A numbing (anesthetic) spray of the throat is used. Endoscopy, when done, is done in an endoscopy suite, under mild sedation. When fluoroscopy is used, the procedure is performed in X-ray. Other techniques require a little longer time. Recovery is usually quick. There is no waiting time to begin eating and drinking to test success of the treatment. Following are some of the methods used. Narrowing of the esophagus is treated by making it bigger. Commonly this is a mechanical problem which can be treated with stretching. This can be done in different ways. Your caregiver will discuss these with you. Some of the means used are:  A series of graduated (increasing thickness) flexible dilators can be used. These are weighted tubes passed through the esophagus into the stomach. The tubes used become progressively larger until the desired stretched size is reached. Graduated dilators are a simple and quick way of opening the esophagus. No visualization is required.  Another method is the use of endoscopy to place a flexible wire across the stricture. The endoscope is removed and the wire left in place. A dilator with a hole through it from end to end is guided down the esophagus and across the stricture. One or more of these dilators  are passed over the wire. At the end of the exam, the wire is removed. This type of treatment may be performed in the X-ray department under fluoroscopy. An advantage of this procedure is the examiner is visualizing the end opening in the esophagus.   Stretching of the esophagus may be done using balloons. Deflated balloons are placed through the endoscope and across the stricture. This type of balloon dilatation is often done at the time of endoscopy or fluoroscopy. Flexible endoscopy allows the examiner to directly view the stricture. A balloon is inserted in the deflated form into the area of narrowing. It is then inflated with air to a certain pressure that is pre-set for a given circumference. When inflated, it becomes sausage shaped, stretched, and makes the stricture larger.   Achalasia requires a longer larger balloon-type dilator. This is frequently done under X-ray control. In this situation, the spastic muscle fibers in the lower esophagus are stretched.  All of the above procedures make the passage of food and water into the stomach easier. They also make it easier for stomach contents to reflux back into the esophagus. Special medications may be used following the procedure to help prevent further stricturing. Proton-pump inhibitor medications are good at decreasing the amount of acid in the stomach juice. When stomach juice refluxes into the esophagus, the juice is no longer as acidic and is less likely to burn or scar the esophagus. RISKS AND COMPLICATIONS Esophageal dilatation is usually performed effectively and without problems. Some complications that can occur are:  A small amount of bleeding almost always happens where the stretching takes place. If this is too excessive it may require more aggressive treatment.   An uncommon complication is perforation (making a hole) of the esophagus. The esophagus is thin. It is easy to make a hole in it. If this happens, an operation may be  necessary to repair this.   A small, undetected perforation could lead to an infection in the chest. This can be very serious.  HOME CARE INSTRUCTIONS   If you received sedation for your procedure, do not drive, make important decisions, or perform any activities requiring your full coordination. Do not drink alcohol, take sedatives, or use any mind altering chemicals unless instructed by your caregiver.   You may use throat lozenges or warm salt water gargles if you have throat discomfort   You can begin eating and drinking normally on return home unless instructed otherwise. Do not purposely try to force large chunks of food down to test the benefits of your procedure.   Mild discomfort can be eased with sips of ice water.   Medications for discomfort may or may not be needed.  SEEK IMMEDIATE MEDICAL CARE IF:   You begin vomiting up blood.   You develop black tarry stools   You develop chills or an unexplained temperature of over 101 F (38.3 C)   You develop chest or abdominal pain.   You develop shortness of breath or feel lightheaded or faint.   Your swallowing is becoming more painful, difficult, or you are unable to swallow.  MAKE SURE YOU:   Understand these instructions.   Will watch your condition.     Will get help right away if you are not doing well or get worse.  Document Released: 11/09/2005 Document Revised: 05/31/2011 Document Reviewed: 12/27/2005 [facility] Patient Information 2012 [place], [place].Esophagogastroduodenoscopy This is an endoscopic procedure (a procedure that uses a device like a flexible telescope) that allows your caregiver to view the upper stomach and small bowel. This test allows your caregiver to look at the esophagus. The esophagus carries food from your mouth to your stomach. They can also look at your duodenum. This is the first part of the small intestine that attaches to the stomach. This test is used to detect problems in the bowel such as  ulcers and inflammation. PREPARATION FOR TEST Nothing to eat after midnight the day before the test. NORMAL FINDINGS Normal esophagus, stomach, and duodenum. Ranges for normal findings may vary among different laboratories and hospitals. You should always check with your doctor after having lab work or other tests done to discuss the meaning of your test results and whether your values are considered within normal limits. MEANING OF TEST  Your caregiver will go over the test results with you and discuss the importance and meaning of your results, as well as treatment options and the need for additional tests if necessary. OBTAINING THE TEST RESULTS It is your responsibility to obtain your test results. Ask the lab or department performing the test when and how you will get your results. Document Released: 01/19/2005 Document Revised: 05/31/2011 Document Reviewed: 08/28/2008 [facility] Patient Information 2012 [place], [place].

## 2011-11-21 NOTE — Interval H&P Note (Signed)
History and Physical Interval Note:  11/21/2011 1:41 PM  Amy Rubio  has presented today for surgery, with the diagnosis of dysphagia  The various methods of treatment have been discussed with the patient and family. After consideration of risks, benefits and other options for treatment, the patient has consented to  Procedure(s) (LRB): ESOPHAGOGASTRODUODENOSCOPY (EGD) WITH ESOPHAGEAL DILATION (N/A) as a surgical intervention .  The patients' history has been reviewed, patient examined, no change in status, stable for surgery.  I have reviewed the patients' chart and labs.  Questions were answered to the patient's satisfaction.     Eula Listen

## 2011-11-26 ENCOUNTER — Encounter: Payer: Self-pay | Admitting: Internal Medicine

## 2012-02-27 DIAGNOSIS — J449 Chronic obstructive pulmonary disease, unspecified: Secondary | ICD-10-CM | POA: Diagnosis not present

## 2012-03-08 ENCOUNTER — Encounter: Payer: Self-pay | Admitting: Internal Medicine

## 2012-03-27 DIAGNOSIS — J449 Chronic obstructive pulmonary disease, unspecified: Secondary | ICD-10-CM | POA: Diagnosis not present

## 2012-04-17 DIAGNOSIS — M7020 Olecranon bursitis, unspecified elbow: Secondary | ICD-10-CM | POA: Diagnosis not present

## 2012-04-17 DIAGNOSIS — J449 Chronic obstructive pulmonary disease, unspecified: Secondary | ICD-10-CM | POA: Diagnosis not present

## 2012-07-26 DIAGNOSIS — G8929 Other chronic pain: Secondary | ICD-10-CM | POA: Diagnosis not present

## 2012-07-26 DIAGNOSIS — Z23 Encounter for immunization: Secondary | ICD-10-CM | POA: Diagnosis not present

## 2012-07-26 DIAGNOSIS — J449 Chronic obstructive pulmonary disease, unspecified: Secondary | ICD-10-CM | POA: Diagnosis not present

## 2012-10-03 DIAGNOSIS — Z79899 Other long term (current) drug therapy: Secondary | ICD-10-CM | POA: Diagnosis not present

## 2012-10-03 DIAGNOSIS — E782 Mixed hyperlipidemia: Secondary | ICD-10-CM | POA: Diagnosis not present

## 2012-11-19 ENCOUNTER — Other Ambulatory Visit (HOSPITAL_COMMUNITY): Payer: Self-pay | Admitting: Family Medicine

## 2012-11-19 DIAGNOSIS — Z139 Encounter for screening, unspecified: Secondary | ICD-10-CM

## 2012-11-19 DIAGNOSIS — J449 Chronic obstructive pulmonary disease, unspecified: Secondary | ICD-10-CM | POA: Diagnosis not present

## 2012-11-19 DIAGNOSIS — M2550 Pain in unspecified joint: Secondary | ICD-10-CM | POA: Diagnosis not present

## 2012-11-25 ENCOUNTER — Ambulatory Visit (HOSPITAL_COMMUNITY)
Admission: RE | Admit: 2012-11-25 | Discharge: 2012-11-25 | Disposition: A | Discharge status: Home / Self Care | Payer: Medicare Other | Source: Ambulatory Visit | Attending: Family Medicine | Admitting: Family Medicine

## 2012-11-25 DIAGNOSIS — Z139 Encounter for screening, unspecified: Secondary | ICD-10-CM

## 2012-11-25 DIAGNOSIS — Z1231 Encounter for screening mammogram for malignant neoplasm of breast: Secondary | ICD-10-CM | POA: Insufficient documentation

## 2013-01-13 ENCOUNTER — Telehealth: Payer: Self-pay | Admitting: Family Medicine

## 2013-01-13 NOTE — Telephone Encounter (Signed)
Needs refill on Perocet prescription.  Call patient when the script is ready.

## 2013-01-13 NOTE — Telephone Encounter (Signed)
Last visit [phone]

## 2013-01-13 NOTE — Telephone Encounter (Signed)
Rx signed by Dr. Lorin Picket and pt notified RX ready for pickup and needs ov before next script is due.

## 2013-01-13 NOTE — Telephone Encounter (Signed)
May refill Percocet. 10 mg/325 mg. #180 tablets. One every 4 hours as needed for pain. Followup office visit in 4 weeks before next prescription is needed.

## 2013-02-10 ENCOUNTER — Other Ambulatory Visit: Payer: Self-pay | Admitting: *Deleted

## 2013-02-10 ENCOUNTER — Telehealth: Payer: Self-pay | Admitting: Family Medicine

## 2013-02-10 MED ORDER — OXYCODONE-ACETAMINOPHEN 10-325 MG PO TABS: 1.0000 | ORAL_TABLET | ORAL | Status: DC | PRN

## 2013-02-10 NOTE — Telephone Encounter (Signed)
Patient needs a refill of her Percocet

## 2013-02-10 NOTE — Telephone Encounter (Signed)
Patient may have refill on Percocet. 10 mg/325 mg. 180 tablets. No refill. Mandatory office visit within the next few weeks.

## 2013-02-10 NOTE — Telephone Encounter (Signed)
Script for percocet 10/325mg  #180 one q 4 hours prn pain ready to be picked up pt notified

## 2013-02-10 NOTE — Telephone Encounter (Signed)
Patient needs a refill of her Percocet °

## 2013-02-12 ENCOUNTER — Encounter: Payer: Self-pay | Admitting: *Deleted

## 2013-02-13 ENCOUNTER — Encounter: Payer: Self-pay | Admitting: Family Medicine

## 2013-02-13 ENCOUNTER — Ambulatory Visit (INDEPENDENT_AMBULATORY_CARE_PROVIDER_SITE_OTHER): Payer: Medicare Other | Admitting: Family Medicine

## 2013-02-13 VITALS — BP 124/64 | Wt 128.8 lb

## 2013-02-13 DIAGNOSIS — J4489 Other specified chronic obstructive pulmonary disease: Secondary | ICD-10-CM

## 2013-02-13 DIAGNOSIS — J449 Chronic obstructive pulmonary disease, unspecified: Secondary | ICD-10-CM

## 2013-02-13 MED ORDER — OXYCODONE-ACETAMINOPHEN 10-325 MG PO TABS: 1.0000 | ORAL_TABLET | ORAL | Status: DC | PRN

## 2013-02-13 NOTE — Progress Notes (Signed)
  Subjective:    Patient ID: Amy Rubio, female    DOB: [birth year], 62 y.o.   MRN: [id number]  Shortness of Breath This is a chronic problem. The current episode started more than 1 year ago. The problem occurs constantly. The problem has been unchanged. Pertinent negatives include no chest pain, fever, headaches, hemoptysis, leg swelling, orthopnea or PND. The symptoms are aggravated by exercise and weather changes. Risk factors include smoking. She has tried beta agonist inhalers and cool air for the symptoms. The treatment provided mild relief. Her past medical history is significant for chronic lung disease and COPD. There is no history of asthma, DVT, PE or pneumonia.   Patient also has chronic pain she uses Percocet for this no more than 6 per day and she states this is done well controlling her pain and discomfort. She denies abusing it she states it does help her function.   Review of Systems  Constitutional: Negative for fever.  Respiratory: Positive for shortness of breath. Negative for hemoptysis.   Cardiovascular: Negative for chest pain, orthopnea, leg swelling and PND.  Neurological: Negative for headaches.       Objective:   Physical Exam  Lungs are clear hearts regular pulse normal abdomen soft extremities no edema skin warm dry      Assessment & Plan:  Chronic joint pain arthritis back pain may continue to use Percocet no greater than 6 per day additional prescriptions given enough to last her until early August office visit within. COPD she was given another prescription for Chantix encouraged to use it she was also encouraged to followup in approximately 3 months.

## 2013-02-25 ENCOUNTER — Other Ambulatory Visit: Payer: Self-pay | Admitting: Family Medicine

## 2013-03-06 ENCOUNTER — Other Ambulatory Visit: Payer: Self-pay | Admitting: Family Medicine

## 2013-03-06 NOTE — Telephone Encounter (Signed)
Refill times 2 

## 2013-04-24 ENCOUNTER — Encounter: Payer: Self-pay | Admitting: Family Medicine

## 2013-04-24 ENCOUNTER — Ambulatory Visit (INDEPENDENT_AMBULATORY_CARE_PROVIDER_SITE_OTHER): Payer: Medicare Other | Admitting: Family Medicine

## 2013-04-24 VITALS — BP 130/86 | Temp 98.0°F | Wt 126.2 lb

## 2013-04-24 DIAGNOSIS — J209 Acute bronchitis, unspecified: Secondary | ICD-10-CM

## 2013-04-24 DIAGNOSIS — G894 Chronic pain syndrome: Secondary | ICD-10-CM | POA: Diagnosis not present

## 2013-04-24 MED ORDER — AZITHROMYCIN 250 MG PO TABS: ORAL_TABLET | ORAL | Status: DC

## 2013-04-24 MED ORDER — OXYCODONE-ACETAMINOPHEN 10-325 MG PO TABS: 1.0000 | ORAL_TABLET | ORAL | Status: DC | PRN
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
## 2013-05-01 ENCOUNTER — Other Ambulatory Visit: Payer: Self-pay | Admitting: Family Medicine

## 2013-05-02 NOTE — Telephone Encounter (Signed)
Ok times 4 on Xanax, ok times 6 on Lisinopril

## 2013-05-07 ENCOUNTER — Encounter: Payer: Self-pay | Admitting: Family Medicine

## 2013-05-07 ENCOUNTER — Ambulatory Visit (INDEPENDENT_AMBULATORY_CARE_PROVIDER_SITE_OTHER): Payer: Medicare Other | Admitting: Family Medicine

## 2013-05-07 VITALS — BP 112/68 | Ht 65.0 in | Wt 125.1 lb

## 2013-05-07 DIAGNOSIS — J449 Chronic obstructive pulmonary disease, unspecified: Secondary | ICD-10-CM | POA: Diagnosis not present

## 2013-05-07 DIAGNOSIS — G894 Chronic pain syndrome: Secondary | ICD-10-CM | POA: Diagnosis not present

## 2013-05-07 DIAGNOSIS — J4489 Other specified chronic obstructive pulmonary disease: Secondary | ICD-10-CM

## 2013-05-07 MED ORDER — OXYCODONE-ACETAMINOPHEN 10-325 MG PO TABS: 1.0000 | ORAL_TABLET | ORAL | Status: DC | PRN
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
## 2013-05-23 ENCOUNTER — Other Ambulatory Visit: Payer: Self-pay | Admitting: Family Medicine

## 2013-06-03 ENCOUNTER — Telehealth: Payer: Self-pay | Admitting: Family Medicine

## 2013-06-03 MED ORDER — OXYCODONE-ACETAMINOPHEN 10-325 MG PO TABS: 1.0000 | ORAL_TABLET | ORAL | Status: DC | PRN

## 2013-06-03 NOTE — Telephone Encounter (Signed)
Rx printed and left up front for patient pick up. Patient notified. 

## 2013-06-03 NOTE — Telephone Encounter (Signed)
May give refill of medication, #180. 1 every 4 hours as needed.

## 2013-06-03 NOTE — Telephone Encounter (Signed)
oxyCODONE-acetaminophen (PERCOCET) 10-325 MG per tablet Pt needs refill please

## 2013-06-03 NOTE — Telephone Encounter (Signed)
Last got #180 percocet on Aug 6th

## 2013-06-27 ENCOUNTER — Telehealth: Payer: Self-pay | Admitting: Family Medicine

## 2013-06-27 NOTE — Telephone Encounter (Signed)
Next script due on Oct 4th . Patient stated she would call next week to get the rx.

## 2013-06-27 NOTE — Telephone Encounter (Signed)
Patient needs Rx for oxycodone °

## 2013-06-30 ENCOUNTER — Telehealth: Payer: Self-pay | Admitting: Family Medicine

## 2013-06-30 MED ORDER — OXYCODONE-ACETAMINOPHEN 10-325 MG PO TABS: 1.0000 | ORAL_TABLET | ORAL | Status: DC | PRN

## 2013-06-30 NOTE — Telephone Encounter (Signed)
RX due October 4th

## 2013-06-30 NOTE — Telephone Encounter (Signed)
Each prescription is to last 30 days. She may have the prescription prepared with the date written on it when she can get it filled.(Not sure when she got it filled at the pharmacy.)

## 2013-06-30 NOTE — Telephone Encounter (Signed)
Rx printed and left up front for patient pick up. May fill rx 07/04/13. Patient notified.

## 2013-06-30 NOTE — Telephone Encounter (Signed)
oxyCODONE-acetaminophen (PERCOCET) 10-325 MG per tablet   Refill please

## 2013-07-29 ENCOUNTER — Telehealth: Payer: Self-pay | Admitting: Family Medicine

## 2013-07-29 NOTE — Telephone Encounter (Signed)
Last seen 05/07/13

## 2013-07-29 NOTE — Telephone Encounter (Signed)
May give 2 week then pt NTBS

## 2013-07-29 NOTE — Telephone Encounter (Signed)
Rx due 08/03/13-Needs office visit for refills. Patient scheduled office visit.

## 2013-07-29 NOTE — Telephone Encounter (Signed)
Patient needs Rx for percocet-patient is hoping to pick this up on 07/31/2013

## 2013-07-31 ENCOUNTER — Ambulatory Visit (INDEPENDENT_AMBULATORY_CARE_PROVIDER_SITE_OTHER): Payer: Medicare Other | Admitting: Family Medicine

## 2013-07-31 ENCOUNTER — Encounter: Payer: Self-pay | Admitting: Family Medicine

## 2013-07-31 ENCOUNTER — Ambulatory Visit: Payer: Medicare Other

## 2013-07-31 VITALS — BP 128/70 | Ht 65.0 in | Wt 124.0 lb

## 2013-07-31 DIAGNOSIS — J449 Chronic obstructive pulmonary disease, unspecified: Secondary | ICD-10-CM

## 2013-07-31 DIAGNOSIS — M1990 Unspecified osteoarthritis, unspecified site: Secondary | ICD-10-CM

## 2013-07-31 DIAGNOSIS — Z23 Encounter for immunization: Secondary | ICD-10-CM

## 2013-07-31 DIAGNOSIS — J4489 Other specified chronic obstructive pulmonary disease: Secondary | ICD-10-CM

## 2013-07-31 DIAGNOSIS — G894 Chronic pain syndrome: Secondary | ICD-10-CM

## 2013-07-31 DIAGNOSIS — I10 Essential (primary) hypertension: Secondary | ICD-10-CM

## 2013-07-31 DIAGNOSIS — M810 Age-related osteoporosis without current pathological fracture: Secondary | ICD-10-CM | POA: Diagnosis not present

## 2013-07-31 MED ORDER — OXYCODONE-ACETAMINOPHEN 10-325 MG PO TABS: 1.0000 | ORAL_TABLET | ORAL | Status: DC | PRN

## 2013-07-31 MED ORDER — OMEPRAZOLE 20 MG PO CPDR: 20.0000 mg | DELAYED_RELEASE_CAPSULE | Freq: Every day | ORAL | Status: DC

## 2013-07-31 NOTE — Progress Notes (Signed)
  Subjective:    Patient ID: Amy Rubio, female    DOB: 28-May-1951, 62 y.o.   MRN: [id number]  Hypertension This is a chronic problem. The current episode started more than 1 year ago. The problem has been gradually improving since onset. The problem is controlled. There are no associated agents to hypertension. There are no known risk factors for coronary artery disease. Treatments tried: lisinopril. The current treatment provides significant improvement. There are no compliance problems.   Patient states she has no concerns at this time.  This patient was seen today for chronic pain  The medication list was reviewed and updated.  Discussion was held with the patient regarding compliance with pain medication. The patient was advised the importance of maintaining medication and not using illegal substances with these. The patient was educated that we can provide 3 monthly scripts for their medication, it is their responsibility to follow the instructions. Discussion was held with the patient to make sure they're not having significant side effects. Patient is aware that pain medications are meant to minimize the severity of the pain to allow their pain levels to improve to allow for better function. They are aware of that pain medications cannot totally remove their pain.   Past medical history family history reviewed patient still smokes she was encouraged to quit in [place] to quit  Review of Systems Patient denies any chest tightness shortness breath rectal bleeding vomiting diarrhea sweats chills.    Objective:   Physical Exam  Vitals reviewed. Constitutional: She appears well-developed and well-nourished.  HENT:  Head: Normocephalic.  Right Ear: External ear normal.  Left Ear: External ear normal.  Neck: Normal range of motion. Neck supple.  Cardiovascular: Normal rate, regular rhythm and normal heart sounds.   No murmur heard. Pulmonary/Chest: Effort normal and breath sounds normal.  No respiratory distress. She has no wheezes.  Abdominal: Soft. Bowel sounds are normal.  Lymphadenopathy:    She has no cervical adenopathy.          Assessment & Plan:  #1 chronic pain-refills on medications given importance of process and taking medicines was discussed #2 COPD continue current medication encourage patient to avoid all smoking Osteoporosis discussed in detail calcium vitamin D weight-bearing exercise she was also encouraged to take her Boniva once a month. Anxiety issues Xanax. Followup 3 months.

## 2013-08-03 ENCOUNTER — Other Ambulatory Visit: Payer: Self-pay | Admitting: Family Medicine

## 2013-10-06 ENCOUNTER — Other Ambulatory Visit: Payer: Self-pay | Admitting: Family Medicine

## 2013-10-06 NOTE — Telephone Encounter (Signed)
Last seen 10/14.

## 2013-10-06 NOTE — Telephone Encounter (Signed)
OK time 2

## 2013-10-30 ENCOUNTER — Encounter: Payer: Self-pay | Admitting: Family Medicine

## 2013-10-30 ENCOUNTER — Ambulatory Visit (INDEPENDENT_AMBULATORY_CARE_PROVIDER_SITE_OTHER): Payer: Medicare Other | Admitting: Family Medicine

## 2013-10-30 VITALS — BP 140/80 | Ht 65.0 in | Wt 124.8 lb

## 2013-10-30 DIAGNOSIS — E785 Hyperlipidemia, unspecified: Secondary | ICD-10-CM | POA: Insufficient documentation

## 2013-10-30 DIAGNOSIS — F17200 Nicotine dependence, unspecified, uncomplicated: Secondary | ICD-10-CM

## 2013-10-30 DIAGNOSIS — J449 Chronic obstructive pulmonary disease, unspecified: Secondary | ICD-10-CM | POA: Diagnosis not present

## 2013-10-30 DIAGNOSIS — I10 Essential (primary) hypertension: Secondary | ICD-10-CM

## 2013-10-30 DIAGNOSIS — G894 Chronic pain syndrome: Secondary | ICD-10-CM | POA: Diagnosis not present

## 2013-10-30 MED ORDER — LISINOPRIL-HYDROCHLOROTHIAZIDE 20-25 MG PO TABS: ORAL_TABLET | ORAL | Status: DC

## 2013-10-30 MED ORDER — IBANDRONATE SODIUM 150 MG PO TABS: 150.0000 mg | ORAL_TABLET | ORAL | Status: DC

## 2013-10-30 MED ORDER — OXYCODONE-ACETAMINOPHEN 10-325 MG PO TABS: 1.0000 | ORAL_TABLET | ORAL | Status: DC | PRN

## 2013-10-30 MED ORDER — FLUOXETINE HCL 20 MG PO CAPS: 20.0000 mg | ORAL_CAPSULE | Freq: Every day | ORAL | Status: DC

## 2013-10-30 MED ORDER — TRAZODONE HCL 50 MG PO TABS: ORAL_TABLET | ORAL | Status: DC

## 2013-10-30 MED ORDER — OMEPRAZOLE 20 MG PO CPDR: 20.0000 mg | DELAYED_RELEASE_CAPSULE | Freq: Every day | ORAL | Status: DC

## 2013-10-30 MED ORDER — TIOTROPIUM BROMIDE MONOHYDRATE 18 MCG IN CAPS: ORAL_CAPSULE | RESPIRATORY_TRACT | Status: DC

## 2013-10-30 NOTE — Progress Notes (Signed)
   Subjective:    Patient ID: Amy Rubio, female    DOB: 08-07-1951, 63 y.o.   MRN: [id number]  HPI Patient arrives for a follow up on chronic pain management. This patient was seen today for chronic pain This patient has some multitude of history including smoking abuse COPD hypertension osteoporosis reflux chronic pain The medication list was reviewed and updated.  Discussion was held with the patient regarding compliance with pain medication. The patient was advised the importance of maintaining medication and not using illegal substances with these. The patient was educated that we can provide 3 monthly scripts for their medication, it is their responsibility to follow the instructions. Discussion was held with the patient to make sure they're not having significant side effects. Patient is aware that pain medications are meant to minimize the severity of the pain to allow their pain levels to improve to allow for better function. They are aware of that pain medications cannot totally remove their pain.     Review of Systems  Constitutional: Negative for activity change, appetite change and fatigue.  HENT: Negative for congestion, ear discharge and rhinorrhea.   Eyes: Negative for discharge.  Respiratory: Positive for cough (occasional) and shortness of breath (with activity). Negative for chest tightness and wheezing.   Cardiovascular: Negative for chest pain.  Gastrointestinal: Negative for vomiting and abdominal pain.  Genitourinary: Negative for frequency and difficulty urinating.  Musculoskeletal: Positive for arthralgias, back pain and myalgias. Negative for neck pain.  Allergic/Immunologic: Negative for environmental allergies and food allergies.  Neurological: Negative for weakness and headaches.  Psychiatric/Behavioral: Negative for behavioral problems and agitation.       Objective:   Physical Exam  Constitutional: She is oriented to person, place, and time. She  appears well-developed and well-nourished.  HENT:  Head: Normocephalic.  Neck: No thyromegaly present.  Cardiovascular: Normal rate, regular rhythm, normal heart sounds and intact distal pulses.   No murmur heard. Pulmonary/Chest: Effort normal and breath sounds normal. No respiratory distress. She has no wheezes.  Abdominal: Soft. She exhibits no distension and no mass. There is no tenderness.  Musculoskeletal: Normal range of motion. She exhibits no edema and no tenderness.  Lymphadenopathy:    She has no cervical adenopathy.  Neurological: She is alert and oriented to person, place, and time. She exhibits normal muscle tone.  Skin: Skin is warm and dry.  Psychiatric: She has a normal mood and affect. Her behavior is normal.          Assessment & Plan:  Chronic pain syndrome  CHRONIC OBSTRUCTIVE PULMONARY DISEASE, SEVERE  Hyperlipidemia - Plan: Lipid panel  Essential hypertension, benign - Plan: Basic metabolic panel  CIGARETTE SMO[employer]ER  Patient was counseled to quit smoking. She states she might try Chantix. She has some at home. She has history hyperlipidemia she tries to eat right she states she will check a lipid profile She does have some insomnia trazodone 50 mg helps continue this Osteoporosis does not need a bone scan currently but she will continue her Boniva as directed Hypertension under good control continue current measures Reflux intermittent continue omeprazole Anxiety issues may use Xanax do not abuse. Depression under decent control with Prozac Chronic pain and discomfort under good control functions well under current measures. Stick with current measures. Followup 3 months.

## 2013-12-24 ENCOUNTER — Other Ambulatory Visit: Payer: Self-pay | Admitting: Family Medicine

## 2013-12-25 NOTE — Telephone Encounter (Signed)
May refill this +4 additional refills 

## 2014-01-28 ENCOUNTER — Encounter: Payer: Self-pay | Admitting: Family Medicine

## 2014-01-28 ENCOUNTER — Ambulatory Visit (INDEPENDENT_AMBULATORY_CARE_PROVIDER_SITE_OTHER): Payer: Medicare Other | Admitting: Family Medicine

## 2014-01-28 VITALS — BP 138/68 | Ht 65.0 in | Wt 119.2 lb

## 2014-01-28 DIAGNOSIS — R634 Abnormal weight loss: Secondary | ICD-10-CM | POA: Diagnosis not present

## 2014-01-28 DIAGNOSIS — J4489 Other specified chronic obstructive pulmonary disease: Secondary | ICD-10-CM

## 2014-01-28 DIAGNOSIS — J449 Chronic obstructive pulmonary disease, unspecified: Secondary | ICD-10-CM

## 2014-01-28 DIAGNOSIS — E785 Hyperlipidemia, unspecified: Secondary | ICD-10-CM

## 2014-01-28 DIAGNOSIS — I10 Essential (primary) hypertension: Secondary | ICD-10-CM

## 2014-01-28 DIAGNOSIS — G894 Chronic pain syndrome: Secondary | ICD-10-CM

## 2014-01-28 DIAGNOSIS — M810 Age-related osteoporosis without current pathological fracture: Secondary | ICD-10-CM

## 2014-01-28 MED ORDER — OXYCODONE-ACETAMINOPHEN 10-325 MG PO TABS: 1.0000 | ORAL_TABLET | ORAL | Status: DC | PRN

## 2014-01-28 MED ORDER — FLUOXETINE HCL 20 MG PO CAPS: ORAL_CAPSULE | ORAL | Status: DC

## 2014-01-28 NOTE — Progress Notes (Signed)
   Subjective:    Patient ID: Amy Rubio, female    DOB: September 28, 1951, 63 y.o.   MRN: [id number]  HPIMed check up.   Stress.   Discuss boniva. Haven't taken it in at least 3 months or more because of the cost. $40 for one pill.   Was taking prozac 20mg  2 a day for years. Last refill was only for #30. This was corrected today This patient was seen today for chronic pain  The medication list was reviewed and updated.  Discussion was held with the patient regarding compliance with pain medication. The patient was advised the importance of maintaining medication and not using illegal substances with these. The patient was educated that we can provide 3 monthly scripts for their medication, it is their responsibility to follow the instructions. Discussion was held with the patient to make sure they're not having significant side effects. Patient is aware that pain medications are meant to minimize the severity of the pain to allow their pain levels to improve to allow for better function. They are aware of that pain medications cannot totally remove their pain.      Review of Systems Patient denies hemoptysis she does relate some coughing and some shortness breath denies chest tightness pressure pain relates a lot of nervousness states she's been stressed not eating well.    Objective:   Physical Exam  Her lungs have bilateral expiratory wheezes moves air fair heart is regular pulses normal patient is thin abdomen soft extremities no edema skin warm dry neurologic grossly normal      Assessment & Plan:  1. Essential hypertension, benign Blood pressure overall doing well check metabolic 7 continue current medications - Basic metabolic panel  2. CHRONIC OBSTRUCTIVE PULMONARY DISEASE, SEVERE Patient needs to quit smoking she was counseled strongly to do so continue current measures. Long-term outlook is not good if she doesn't quit smoking  3. Osteoporosis She does have osteoporosis  switch back to Fosamax use once weekly. In place of the knee but. Check bone density as well - DG Bone Density  4. WEIGHT LOSS, RECENT She's been stressed lately not eating well we need to check CBC TSH I doubt cancer I think it's stress related - CBC with Differential - TSH  5. Chronic pain syndrome She has chronic pain in her back hips legs medication she uses on regular basis she denies abusing it 3 prescriptions were given  6. Hyperlipidemia Has history hyperlipidemia check lipid profile - Lipid panel  Followup 3 months  Greater than 25 minutes spent with discussing the patient all these issues greater than half in discussion and questions

## 2014-02-10 ENCOUNTER — Telehealth: Payer: Self-pay | Admitting: Family Medicine

## 2014-02-10 NOTE — Telephone Encounter (Signed)
Rx for #60 was sent to pharmacy 01/28/14-confirmed with pharmacy-Patient notified.

## 2014-02-10 NOTE — Telephone Encounter (Signed)
Patient says that pharmacy says that the Prozac med she is on has not been fixed to where she gets 60 pills, only 30. She would like [place] to send in a refill of the 60 quantity.   [employer]

## 2014-02-13 ENCOUNTER — Ambulatory Visit (HOSPITAL_COMMUNITY)
Admission: RE | Admit: 2014-02-13 | Discharge: 2014-02-13 | Disposition: A | Discharge status: Home / Self Care | Payer: Medicare Other | Source: Ambulatory Visit | Attending: Family Medicine | Admitting: Family Medicine

## 2014-02-13 DIAGNOSIS — Z780 Asymptomatic menopausal state: Secondary | ICD-10-CM | POA: Diagnosis not present

## 2014-02-13 DIAGNOSIS — M810 Age-related osteoporosis without current pathological fracture: Secondary | ICD-10-CM | POA: Diagnosis not present

## 2014-02-19 MED ORDER — ALENDRONATE SODIUM 70 MG PO TABS
70.0000 mg | ORAL_TABLET | ORAL | Status: DC
Start: 2014-02-19 — End: 2015-02-18

## 2014-02-19 NOTE — Addendum Note (Signed)
Addended by: Carmelina Noun on: 02/19/2014 01:19 PM   Modules accepted: Orders, Medications

## 2014-04-10 ENCOUNTER — Telehealth: Payer: Self-pay | Admitting: *Deleted

## 2014-04-10 NOTE — Telephone Encounter (Signed)
Amy Rubio from front stated someone called and stated her mother had taken a brunch of oxycodone then hung up. Amy Rubio traced the number back to [employer]. I called the number back and asked for [name]. I asked Tania about the phone call. She stated she was ok and she only takes the oxycodone as prescribed that her daughter was lying and she had already talked with Dr. Nicki Reaper about her and that she would probably call and do this. She stated her daughter was threatening her and I asked her if she wanted me to call police and she said no that she would call herself if she felt she needed to.

## 2014-04-23 ENCOUNTER — Telehealth: Payer: Self-pay | Admitting: Family Medicine

## 2014-04-23 NOTE — Telephone Encounter (Signed)
Patient needs refill on percocet 10/325mg  has appointment 05/04/14 for medcheck. The first available appointment we had.

## 2014-04-27 ENCOUNTER — Telehealth: Payer: Self-pay | Admitting: Family Medicine

## 2014-04-27 MED ORDER — OXYCODONE-ACETAMINOPHEN 10-325 MG PO TABS: 1.0000 | ORAL_TABLET | ORAL | Status: DC | PRN

## 2014-04-27 NOTE — Telephone Encounter (Signed)
Patient notified

## 2014-04-27 NOTE — Telephone Encounter (Signed)
Patient needs refill on percocet 10/325 message was taken on 7/23 but wasn't routed . Patient needs this as soon as possible please.

## 2014-04-27 NOTE — Addendum Note (Signed)
Addended byCharolotte Capuchin D on: 04/27/2014 11:59 AM   Modules accepted: Orders

## 2014-04-27 NOTE — Telephone Encounter (Signed)
Dr. Bary Leriche pt. Seen 01/28/14

## 2014-04-27 NOTE — Telephone Encounter (Signed)
Ok times one mo, f u as sched

## 2014-05-04 ENCOUNTER — Ambulatory Visit (INDEPENDENT_AMBULATORY_CARE_PROVIDER_SITE_OTHER): Payer: Medicare Other | Admitting: Family Medicine

## 2014-05-04 ENCOUNTER — Encounter: Payer: Self-pay | Admitting: Family Medicine

## 2014-05-04 VITALS — BP 144/72 | Ht 65.0 in | Wt 116.0 lb

## 2014-05-04 DIAGNOSIS — E785 Hyperlipidemia, unspecified: Secondary | ICD-10-CM | POA: Diagnosis not present

## 2014-05-04 DIAGNOSIS — J449 Chronic obstructive pulmonary disease, unspecified: Secondary | ICD-10-CM

## 2014-05-04 DIAGNOSIS — I10 Essential (primary) hypertension: Secondary | ICD-10-CM | POA: Diagnosis not present

## 2014-05-04 DIAGNOSIS — R634 Abnormal weight loss: Secondary | ICD-10-CM

## 2014-05-04 MED ORDER — OXYCODONE-ACETAMINOPHEN 10-325 MG PO TABS: 1.0000 | ORAL_TABLET | ORAL | Status: DC | PRN

## 2014-05-04 MED ORDER — ALPRAZOLAM 1 MG PO TABS: ORAL_TABLET | ORAL | Status: DC

## 2014-05-04 NOTE — Progress Notes (Signed)
   Subjective:    Patient ID: Amy Rubio, female    DOB: [birth year], 63 y.o.   MRN: [id number]  HPI Patient is here today for a 3 month check up.  She needs a refill on her Xanax.  No new concerns/questions.  This patient was seen today for chronic pain  The medication list was reviewed and updated.   -Compliance with pain medication: good  The patient was advised the importance of maintaining medication and not using illegal substances with these.  Refills needed: yes  The patient was educated that we can provide 3 monthly scripts for their medication, it is their responsibility to follow the instructions.  Side effects or complications from medications: none  Patient is aware that pain medications are meant to minimize the severity of the pain to allow their pain levels to improve to allow for better function. They are aware of that pain medications cannot totally remove their pain.  Due for UDT ( at least once per year) : this fall  She suffers with severe COPD she still smokes she knows she needs to quit she has cut back she denies coughing up blood.  She relates she's been very stressed recently and because of that she's found herself to be very anxious her rundown nervous has difficult time focusing and sleeping. She has a daughter who has addiction problems who has been staying with her this has stressed her out as well. She denies being depressed. She denies abusing her own medicines. She states because of stress she has not been eating well.      Review of Systems  Constitutional: Positive for appetite change (decreased/stressed). Negative for activity change and fatigue.  HENT: Negative for congestion.   Respiratory: Positive for cough and shortness of breath (has COPD, stable,smoker).   Cardiovascular: Negative for chest pain, palpitations and leg swelling.  Gastrointestinal: Negative for abdominal pain.  Endocrine: Negative for polydipsia and polyphagia.    Genitourinary: Negative for frequency.  Musculoskeletal: Positive for arthralgias and back pain.  Neurological: Negative for weakness.  Psychiatric/Behavioral: Negative for confusion.       Objective:   Physical Exam  Vitals reviewed. Constitutional: She appears well-nourished. No distress.  Cardiovascular: Normal rate, regular rhythm and normal heart sounds.   No murmur heard. Pulmonary/Chest: Effort normal and breath sounds normal. No respiratory distress.  Musculoskeletal: She exhibits no edema.  Lymphadenopathy:    She has no cervical adenopathy.  Neurological: She is alert. She exhibits normal muscle tone.  Psychiatric: Her behavior is normal.          Assessment & Plan:  Weight loss-I. feel this probably related to stress and he the recent weather if she continues to lose weight we will need to do additional testing She has lab work which is been ordered in the past that she never got completed I encouraged her to go ahead and get that completed. COPD stable encourage patient to quit smoking Chronic pain refills on medicines given 3 scripts given followup 3 months Anxiety issues patient to use anxiety medicines only when necessary cautioned drowsiness Patient has been counseled in the past not to combine multiple medicines at one time to lessen the risk of accidental overdose

## 2014-05-04 NOTE — Patient Instructions (Addendum)
As part of your visit today we have covered chronic pain. You have been given prescription for pain medicines. Certain pain medications such as hydrocodone  allow for refills. Other pain medications such as oxycodone require separate prescriptions. Nonetheless, your expected to come in for a office visit before further pain medications are issued.   We will not refill medications or early nor will we give an extended month supply at the end of these prescriptions.It is your responsibility to keep up with medications. They will not be replaced.  It is your responsibility to schedule an office visit in 3-4 months to be seen before you are out of your medication. Do not call our office to request early refills or additional refills.   We are required by law to adhere to regulations. Failure on our part to follow these regulations could jeopardize our prescription license which in turn would cause [place] not to be able to care for you.   DASH Eating Plan DASH stands for "Dietary Approaches to Stop Hypertension." The DASH eating plan is a healthy eating plan that has been shown to reduce high blood pressure (hypertension). Additional health benefits may include reducing the risk of type 2 diabetes mellitus, heart disease, and stroke. The DASH eating plan may also help with weight loss. WHAT DO I NEED TO KNOW ABOUT THE DASH EATING PLAN? For the DASH eating plan, you will follow these general guidelines:  Choose foods with a percent daily value for sodium of less than 5% (as listed on the food label).  Use salt-free seasonings or herbs instead of table salt or sea salt.  Check with your health care provider or pharmacist before using salt substitutes.  Eat lower-sodium products, often labeled as "lower sodium" or "no salt added."  Eat fresh foods.  Eat more vegetables, fruits, and low-fat dairy products.  Choose whole grains. Look for the word "whole" as the first word in the ingredient list.  Choose  fish and skinless chicken or [place] more often than red meat. Limit fish, poultry, and meat to 6 oz (170 g) each day.  Limit sweets, desserts, sugars, and sugary drinks.  Choose heart-healthy fats.  Limit cheese to 1 oz (28 g) per day.  Eat more home-cooked food and less restaurant, buffet, and fast food.  Limit fried foods.  Cook foods using methods other than frying.  Limit canned vegetables. If you do use them, rinse them well to decrease the sodium.  When eating at a restaurant, ask that your food be prepared with less salt, or no salt if possible. WHAT FOODS CAN I EAT? Seek help from a dietitian for individual calorie needs. Grains Whole grain or whole wheat bread. Brown rice. Whole grain or whole wheat pasta. Quinoa, bulgur, and whole grain cereals. Low-sodium cereals. Corn or whole wheat flour tortillas. Whole grain cornbread. Whole grain crackers. Low-sodium crackers. Vegetables Fresh or frozen vegetables (raw, steamed, roasted, or grilled). Low-sodium or reduced-sodium tomato and vegetable juices. Low-sodium or reduced-sodium tomato sauce and paste. Low-sodium or reduced-sodium canned vegetables.  Fruits All fresh, canned (in natural juice), or frozen fruits. Meat and Other Protein Products Ground beef (85% or leaner), grass-fed beef, or beef trimmed of fat. Skinless chicken or [place]. Ground chicken or [place]. Pork trimmed of fat. All fish and seafood. Eggs. Dried beans, peas, or lentils. Unsalted nuts and seeds. Unsalted canned beans. Dairy Low-fat dairy products, such as skim or 1% milk, 2% or reduced-fat cheeses, low-fat ricotta or cottage cheese, or plain low-fat  yogurt. Low-sodium or reduced-sodium cheeses. Fats and Oils Tub margarines without trans fats. Light or reduced-fat mayonnaise and salad dressings (reduced sodium). Avocado. Safflower, olive, or canola oils. Natural peanut or almond butter. Other Unsalted popcorn and pretzels. The items listed above may not be  a complete list of recommended foods or beverages. Contact your dietitian for more options. WHAT FOODS ARE NOT RECOMMENDED? Grains White bread. White pasta. White rice. Refined cornbread. Bagels and croissants. Crackers that contain trans fat. Vegetables Creamed or fried vegetables. Vegetables in a cheese sauce. Regular canned vegetables. Regular canned tomato sauce and paste. Regular tomato and vegetable juices. Fruits Dried fruits. Canned fruit in light or heavy syrup. Fruit juice. Meat and Other Protein Products Fatty cuts of meat. Ribs, chicken wings, bacon, sausage, bologna, salami, chitterlings, fatback, hot dogs, bratwurst, and packaged luncheon meats. Salted nuts and seeds. Canned beans with salt. Dairy Whole or 2% milk, cream, half-and-half, and cream cheese. Whole-fat or sweetened yogurt. Full-fat cheeses or blue cheese. Nondairy creamers and whipped toppings. Processed cheese, cheese spreads, or cheese curds. Condiments Onion and garlic salt, seasoned salt, table salt, and sea salt. Canned and packaged gravies. Worcestershire sauce. Tartar sauce. Barbecue sauce. Teriyaki sauce. Soy sauce, including reduced sodium. Steak sauce. Fish sauce. Oyster sauce. Cocktail sauce. Horseradish. Ketchup and mustard. Meat flavorings and tenderizers. Bouillon cubes. Hot sauce. Tabasco sauce. Marinades. Taco seasonings. Relishes. Fats and Oils Butter, stick margarine, lard, shortening, ghee, and bacon fat. Coconut, palm kernel, or palm oils. Regular salad dressings. Other Pickles and olives. Salted popcorn and pretzels. The items listed above may not be a complete list of foods and beverages to avoid. Contact your dietitian for more information. WHERE CAN I FIND MORE INFORMATION? National Heart, Lung, and Blood Institute: travelstabloid.com Document Released: 09/07/2011 Document Revised: 02/02/2014 Document Reviewed: 07/23/2013 [facility] Patient Information 2015  [place], [place]. This information is not intended to replace advice given to you by your health care provider. Make sure you discuss any questions you have with your health care provider.

## 2014-07-01 ENCOUNTER — Other Ambulatory Visit: Payer: Self-pay | Admitting: Family Medicine

## 2014-07-01 NOTE — Telephone Encounter (Signed)
Last seen 05/04/14

## 2014-07-09 DIAGNOSIS — R634 Abnormal weight loss: Secondary | ICD-10-CM | POA: Diagnosis not present

## 2014-07-09 DIAGNOSIS — I10 Essential (primary) hypertension: Secondary | ICD-10-CM | POA: Diagnosis not present

## 2014-07-09 DIAGNOSIS — E785 Hyperlipidemia, unspecified: Secondary | ICD-10-CM | POA: Diagnosis not present

## 2014-07-09 LAB — CBC WITH DIFFERENTIAL/PLATELET
BASOS PCT: 1 % (ref 0–1)
Basophils Absolute: 0 10*3/uL (ref 0.0–0.1)
EOS PCT: 4 % (ref 0–5)
Eosinophils Absolute: 0.2 10*3/uL (ref 0.0–0.7)
HEMATOCRIT: 36.9 % (ref 36.0–46.0)
Hemoglobin: 12.3 g/dL (ref 12.0–15.0)
LYMPHS PCT: 40 % (ref 12–46)
Lymphs Abs: 1.9 10*3/uL (ref 0.7–4.0)
MCH: 32.3 pg (ref 26.0–34.0)
MCHC: 33.3 g/dL (ref 30.0–36.0)
MCV: 96.9 fL (ref 78.0–100.0)
Monocytes Absolute: 0.4 10*3/uL (ref 0.1–1.0)
Monocytes Relative: 8 % (ref 3–12)
Neutro Abs: 2.3 10*3/uL (ref 1.7–7.7)
Neutrophils Relative %: 47 % (ref 43–77)
Platelets: 384 10*3/uL (ref 150–400)
RBC: 3.81 MIL/uL — ABNORMAL LOW (ref 3.87–5.11)
RDW: 15.2 % (ref 11.5–15.5)
WBC: 4.8 10*3/uL (ref 4.0–10.5)

## 2014-07-10 LAB — BASIC METABOLIC PANEL
BUN: 8 mg/dL (ref 6–23)
CALCIUM: 9 mg/dL (ref 8.4–10.5)
CO2: 29 meq/L (ref 19–32)
Chloride: 97 mEq/L (ref 96–112)
Creat: 0.77 mg/dL (ref 0.50–1.10)
Glucose, Bld: 87 mg/dL (ref 70–99)
Potassium: 4.2 mEq/L (ref 3.5–5.3)
Sodium: 132 mEq/L — ABNORMAL LOW (ref 135–145)

## 2014-07-10 LAB — LIPID PANEL
Cholesterol: 146 mg/dL (ref 0–200)
HDL: 35 mg/dL — AB (ref >39)
LDL CALC: 81 mg/dL (ref 0–99)
Total CHOL/HDL Ratio: 4.2 Ratio
Triglycerides: 152 mg/dL — ABNORMAL HIGH (ref <150)
VLDL: 30 mg/dL (ref 0–40)

## 2014-07-10 LAB — TSH: TSH: 2.043 u[IU]/mL (ref 0.350–4.500)

## 2014-07-27 ENCOUNTER — Telehealth: Payer: Self-pay | Admitting: Family Medicine

## 2014-07-27 NOTE — Telephone Encounter (Signed)
Pts daughter calling to say that you need to be aware of the medicine intake she is  Actually doing that apparently she says the patient isn't telling you.  She states that mom is taking 180 all of her pills within a few weeks, then she is getting other pills from  Friends an her daddy to get her through till she gets her refill from you at her 30 days. She is stating she is  Also taking a methadone in this mix as well. She recently has told her daughter that she is getting from a  Friend some adderall or some sort of a add med that is making her feel energized an on top of the world. States she is  Going to ask you for some when she sees you next time.   Daughter says this is the last time she is going to call. She does not live with her momma anymore, she just wants her to be  Safe and wants you to be aware of her condition.

## 2014-07-27 NOTE — Telephone Encounter (Signed)
All of this note is noted. There is interesting dynamics between the daughter and the mom.

## 2014-07-29 ENCOUNTER — Ambulatory Visit (INDEPENDENT_AMBULATORY_CARE_PROVIDER_SITE_OTHER): Payer: Medicare Other | Admitting: Family Medicine

## 2014-07-29 ENCOUNTER — Encounter: Payer: Self-pay | Admitting: Family Medicine

## 2014-07-29 VITALS — BP 122/70 | Temp 97.9°F | Ht 65.0 in | Wt 119.0 lb

## 2014-07-29 DIAGNOSIS — G894 Chronic pain syndrome: Secondary | ICD-10-CM | POA: Diagnosis not present

## 2014-07-29 DIAGNOSIS — E785 Hyperlipidemia, unspecified: Secondary | ICD-10-CM | POA: Diagnosis not present

## 2014-07-29 DIAGNOSIS — M150 Primary generalized (osteo)arthritis: Secondary | ICD-10-CM

## 2014-07-29 DIAGNOSIS — Z23 Encounter for immunization: Secondary | ICD-10-CM

## 2014-07-29 DIAGNOSIS — R5383 Other fatigue: Secondary | ICD-10-CM

## 2014-07-29 DIAGNOSIS — I10 Essential (primary) hypertension: Secondary | ICD-10-CM

## 2014-07-29 DIAGNOSIS — M159 Polyosteoarthritis, unspecified: Secondary | ICD-10-CM

## 2014-07-29 MED ORDER — OXYCODONE-ACETAMINOPHEN 10-325 MG PO TABS: 1.0000 | ORAL_TABLET | ORAL | Status: DC | PRN

## 2014-07-29 NOTE — Progress Notes (Signed)
   Subjective:    Patient ID: Amy Rubio, female    DOB: Mar 06, 1951, 63 y.o.   MRN: [id number]  Hypertension This is a chronic problem. The current episode started more than 1 year ago. The problem has been gradually improving since onset. The problem is controlled. Associated symptoms include shortness of breath. Pertinent negatives include no chest pain. There are no associated agents to hypertension. There are no known risk factors for coronary artery disease. Treatments tried: lisinorpil-HCTZ. The current treatment provides significant improvement. There are no compliance problems.   This patient was seen today for chronic pain  The medication list was reviewed and updated.   -Compliance with pain medication: good  The patient was advised the importance of maintaining medication and not using illegal substances with these.  Refills needed: yes  The patient was educated that we can provide 3 monthly scripts for their medication, it is their responsibility to follow the instructions.  Side effects or complications from medications: none  Patient is aware that pain medications are meant to minimize the severity of the pain to allow their pain levels to improve to allow for better function. They are aware of that pain medications cannot totally remove their pain.  Due for UDT ( at least once per year) : next visit       Patient wants to discuss a new medication with the doctor.  The patient was given a tablet of Provigil by a friend and she states it really helped her energy and helped her stay more focused and she states she would like to have some of this prescribed to her because she constantly feels fatigued and tired and worn down. Review of Systems  Constitutional: Negative for fever, activity change, appetite change and fatigue.  HENT: Negative for congestion.   Respiratory: Positive for cough and shortness of breath. Negative for chest tightness.   Cardiovascular: Negative  for chest pain and leg swelling.  Endocrine: Negative for polydipsia and polyphagia.  Genitourinary: Negative for frequency.  Neurological: Negative for weakness.  Psychiatric/Behavioral: Negative for confusion.       Objective:   Physical Exam  Lungs are clear heart regular pulse normal extremities no edema skin warm dry neurologic grossly normal Subjective discomfort in her lower back.     Assessment & Plan:  #1 chronic pain 3 prescriptions of her pain medicine were given. We will initiate urine drug testing on next visit. Patient was instructed to follow-up in approximately 3 months  #2 anxiety stable not to exceed III nerve pills per day  #3 chronic fatigue and tiredness I believe this is related to her COPD as well as anxiety depression issues as well as stress at home and chronic pain. I do not feel that this person should be on Provigil. She pressed very hard to be placed on Provigil. Patient would like to see a specialist to see if they would be willing to prescribe this. I told her we could have her see psychiatry to see if there is anything they can offer for her fatigue . Possibly Abilify. I don't recommend Provigil but possibly a specialist is comfortable with that.     25 minutes spent with patient

## 2014-08-03 ENCOUNTER — Telehealth: Payer: Self-pay | Admitting: Family Medicine

## 2014-08-03 NOTE — Telephone Encounter (Signed)
Calling to check on referral to psychology.

## 2014-08-06 NOTE — Telephone Encounter (Signed)
LMOM to notify pt that referral has been sent & [facility] [phone]) will contact her to schedule appt

## 2014-08-12 ENCOUNTER — Other Ambulatory Visit: Payer: Self-pay | Admitting: Family Medicine

## 2014-08-20 ENCOUNTER — Other Ambulatory Visit: Payer: Self-pay | Admitting: *Deleted

## 2014-08-20 MED ORDER — ALBUTEROL SULFATE HFA 108 (90 BASE) MCG/ACT IN AERS: 2.0000 | INHALATION_SPRAY | RESPIRATORY_TRACT | Status: DC | PRN

## 2014-08-21 ENCOUNTER — Telehealth: Payer: Self-pay | Admitting: Family Medicine

## 2014-08-21 ENCOUNTER — Ambulatory Visit (HOSPITAL_COMMUNITY)
Admission: RE | Admit: 2014-08-21 | Discharge: 2014-08-21 | Disposition: A | Discharge status: Home / Self Care | Payer: Medicare Other | Source: Ambulatory Visit | Attending: Family Medicine | Admitting: Family Medicine

## 2014-08-21 DIAGNOSIS — J449 Chronic obstructive pulmonary disease, unspecified: Secondary | ICD-10-CM | POA: Insufficient documentation

## 2014-08-21 DIAGNOSIS — R0602 Shortness of breath: Secondary | ICD-10-CM

## 2014-08-21 DIAGNOSIS — J439 Emphysema, unspecified: Secondary | ICD-10-CM | POA: Diagnosis not present

## 2014-08-21 MED ORDER — DOXYCYCLINE HYCLATE 100 MG PO TABS: 100.0000 mg | ORAL_TABLET | Freq: Two times a day (BID) | ORAL | Status: DC

## 2014-08-21 NOTE — Telephone Encounter (Signed)
Seen 10/28 for chronic visit

## 2014-08-21 NOTE — Telephone Encounter (Signed)
Patient said that her emphysema may be worse.  Her chest has been hurting a different kind of hurt the past 2 weeks and wants to know if we can order a chest xray?

## 2014-08-21 NOTE — Telephone Encounter (Signed)
Patient states that she is not having true pain but her chest has been been aching for 2 weeks-no cough but is having SOB and wheezing but patient states that is normal for her COPD(patient smoker)

## 2014-08-21 NOTE — Telephone Encounter (Signed)
Left message to return call 

## 2014-08-21 NOTE — Telephone Encounter (Signed)
Notified patient medication has been sent to pharmacy. Patient was notified to go to hospital to have chest xray done.

## 2014-08-21 NOTE — Telephone Encounter (Signed)
This age plus htn plus copd call and get careful description of pt's "different type of chest pain"

## 2014-08-21 NOTE — Telephone Encounter (Signed)
Doxy 100 bid for ten d for exacerb of copd plus chest xray

## 2014-09-18 ENCOUNTER — Other Ambulatory Visit: Payer: Self-pay | Admitting: Family Medicine

## 2014-09-18 NOTE — Telephone Encounter (Signed)
May refill this +3 additional refills 

## 2014-10-08 ENCOUNTER — Ambulatory Visit (HOSPITAL_COMMUNITY): Payer: Medicare Other | Admitting: Psychiatry

## 2014-10-22 ENCOUNTER — Telehealth: Payer: Self-pay | Admitting: Family Medicine

## 2014-10-22 NOTE — Telephone Encounter (Signed)
Pt is requesting that the dr call and give [facility] the ok To fill her percocet today instead of sat. Pt is afraid she wont be able to  Get there on sat due to weather.

## 2014-10-22 NOTE — Telephone Encounter (Signed)
Ok per Dr. Nicki Reaper but still has to last 30 days from Saturday. Pt notified on voicemail and [facility] notified.

## 2014-11-19 ENCOUNTER — Ambulatory Visit (INDEPENDENT_AMBULATORY_CARE_PROVIDER_SITE_OTHER): Payer: Medicare Other | Admitting: Family Medicine

## 2014-11-19 ENCOUNTER — Encounter: Payer: Self-pay | Admitting: Family Medicine

## 2014-11-19 VITALS — BP 140/88 | Ht 65.0 in | Wt 117.1 lb

## 2014-11-19 DIAGNOSIS — I10 Essential (primary) hypertension: Secondary | ICD-10-CM

## 2014-11-19 DIAGNOSIS — J441 Chronic obstructive pulmonary disease with (acute) exacerbation: Secondary | ICD-10-CM | POA: Diagnosis not present

## 2014-11-19 DIAGNOSIS — M810 Age-related osteoporosis without current pathological fracture: Secondary | ICD-10-CM | POA: Diagnosis not present

## 2014-11-19 DIAGNOSIS — G894 Chronic pain syndrome: Secondary | ICD-10-CM

## 2014-11-19 MED ORDER — OXYCODONE-ACETAMINOPHEN 10-325 MG PO TABS: 1.0000 | ORAL_TABLET | ORAL | Status: DC | PRN

## 2014-11-19 MED ORDER — AMOXICILLIN 500 MG PO TABS: 500.0000 mg | ORAL_TABLET | Freq: Three times a day (TID) | ORAL | Status: DC

## 2014-11-19 NOTE — Progress Notes (Signed)
   Subjective:    Patient ID: Amy Rubio, female    DOB: [birth year], 64 y.o.   MRN: [id number]  HPI This patient was seen today for chronic pain  The medication list was reviewed and updated.   -Compliance with pain medication:yes  The patient was advised the importance of maintaining medication and not using illegal substances with these.  Refills needed: yes  The patient was educated that we can provide 3 monthly scripts for their medication, it is their responsibility to follow the instructions.  Side effects or complications from medications: none  Patient is aware that pain medications are meant to minimize the severity of the pain to allow their pain levels to improve to allow for better function. They are aware of that pain medications cannot totally remove their pain.  Due for UDT ( at least once per year) : next visit  Patient states that she is having issues with depression and she hurts all over all the time.  Registry checked    Review of Systems Patient denies being suicidal she relates a lot of joint pains back pain she also relates tooth pain she does state coughing she still smokes she does get a little short of breath if she pushes herself but otherwise does okay    Objective:   Physical Exam Subjective discomfort in her back mild kyphosis lungs are clear hearts regular extremities no edema skin warm dry infected tooth is noted in the upper gum      25-30 minutes spent with patient Assessment & Plan:  Chronic pain prescriptions given she was allow to get it refilled now but I told her explicitly that in the future it must last 30 days. 3 prescriptions were given.  Dental infection this is why she's been taking the pain medicine more often she needs to stick with 6 per day amoxicillin 10 days as directed  COPD under good control but she needs to quit smoking I talked with her at length about the this.  Osteoporosis she has not been taking a medicine  because she says she forgot I told her she needs to start taking it on a regular basis once a week properly  Patient under a lot of social stress at home because of having to care for family members who have your responsibility and drug habits hopefully things will get better she denies being suicidal she feels things will get better  HTN under decent control. Continue current measures

## 2014-11-19 NOTE — Patient Instructions (Signed)

## 2014-12-15 ENCOUNTER — Telehealth: Payer: Self-pay | Admitting: Family Medicine

## 2014-12-15 NOTE — Telephone Encounter (Signed)
Pt called stating that her arm has been aching for going on 5 days. Pt wants to know If she should go to the hospital for an xray or if she should be seen in the office tomorrow.

## 2014-12-15 NOTE — Telephone Encounter (Signed)
Discussed with pt that she will need ov. Transferred her to front to schedule for tomorrow.

## 2014-12-16 ENCOUNTER — Ambulatory Visit (INDEPENDENT_AMBULATORY_CARE_PROVIDER_SITE_OTHER): Payer: Medicare Other | Admitting: Family Medicine

## 2014-12-16 ENCOUNTER — Ambulatory Visit (HOSPITAL_COMMUNITY)
Admission: RE | Admit: 2014-12-16 | Discharge: 2014-12-16 | Disposition: A | Discharge status: Home / Self Care | Payer: Medicare Other | Source: Ambulatory Visit | Attending: Family Medicine | Admitting: Family Medicine

## 2014-12-16 ENCOUNTER — Encounter: Payer: Self-pay | Admitting: Family Medicine

## 2014-12-16 VITALS — BP 148/86 | Ht 65.0 in | Wt 117.4 lb

## 2014-12-16 DIAGNOSIS — M542 Cervicalgia: Secondary | ICD-10-CM | POA: Diagnosis not present

## 2014-12-16 DIAGNOSIS — M5412 Radiculopathy, cervical region: Secondary | ICD-10-CM | POA: Diagnosis not present

## 2014-12-16 DIAGNOSIS — M25512 Pain in left shoulder: Secondary | ICD-10-CM | POA: Insufficient documentation

## 2014-12-16 DIAGNOSIS — M19012 Primary osteoarthritis, left shoulder: Secondary | ICD-10-CM | POA: Diagnosis not present

## 2014-12-16 DIAGNOSIS — M47812 Spondylosis without myelopathy or radiculopathy, cervical region: Secondary | ICD-10-CM | POA: Diagnosis not present

## 2014-12-16 MED ORDER — GABAPENTIN 100 MG PO CAPS: 100.0000 mg | ORAL_CAPSULE | Freq: Three times a day (TID) | ORAL | Status: DC

## 2014-12-16 MED ORDER — PREDNISONE 20 MG PO TABS: ORAL_TABLET | ORAL | Status: DC

## 2014-12-16 NOTE — Progress Notes (Signed)
   Subjective:    Patient ID: Amy Rubio, female    DOB: 1950-11-10, 64 y.o.   MRN: [id number]  HPI  Patient arrives with left arm pain for 6 days. Pain is from shoulder to wrist. This been going on for about 6 days started off as a ache in the shoulder then started coming down the arm up into the neck hurts with movement worse if she tries to do anything denies any chest pressure tightness no change in her COPD. No fevers recently. Review of Systems    see above. Objective:   Physical Exam  Although her range of motion is fair a causes intense pain to lift her arm she describes the majority of the pain in the shoulder region but she also states pain radiates from her neck to her shoulder all the way to the elbow and the hand there is no weakness in the hand difficult time judging strength of the upper arm because of the pain Lungs heart normal      Assessment & Plan:  Cervical impingement probable Possible shoulder tendinitis Prednisone taper X-rays ordered May need MRI of the neck if ongoing trouble Neurontin gradually titrate up to 100 mg 3 times a day to help with the discomfort Await the results of x-ray

## 2014-12-23 IMAGING — CR DG CHEST 2V
2 series · 2 of 2 positions shown · non-contrast
Comparison: 08/17/2011 and chest CT dated 09/14/2011

CLINICAL DATA: Shortness of breath.  COPD.

EXAM:
CHEST  2 VIEW

[view not recorded (1 of 2)]
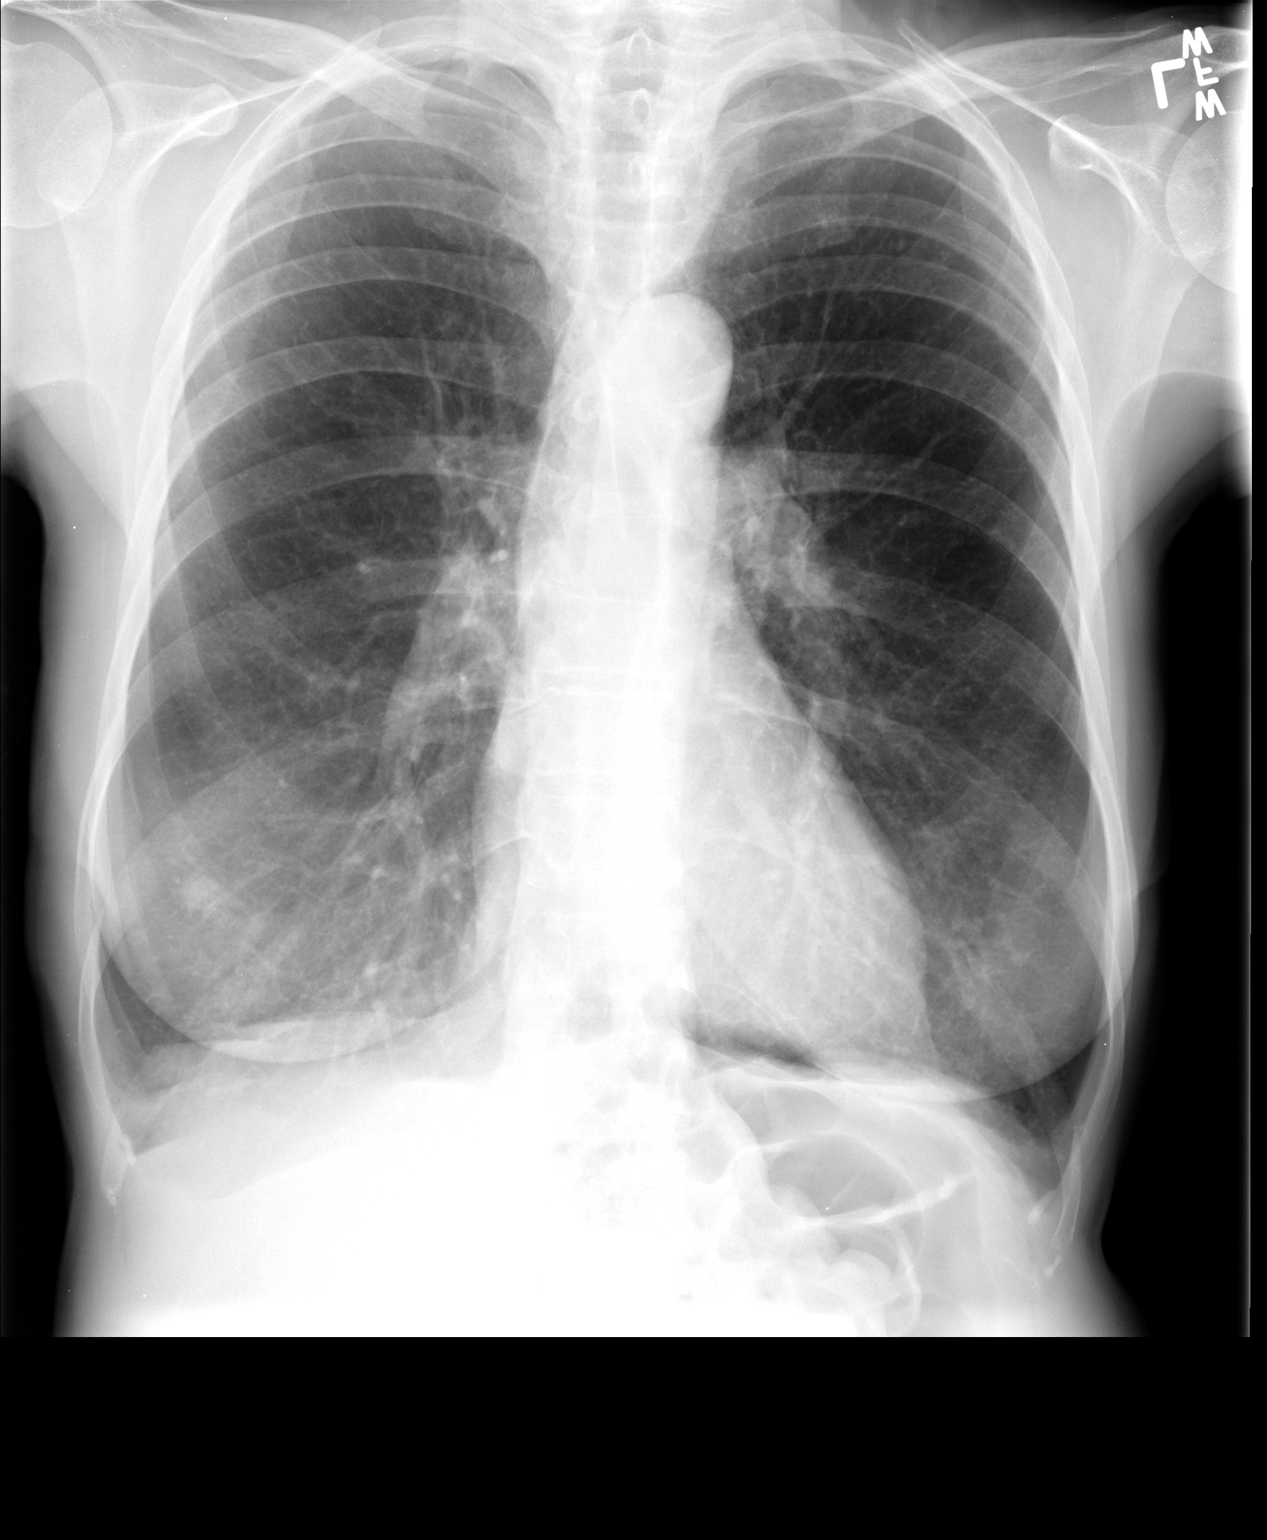

[view not recorded (2 of 2)]
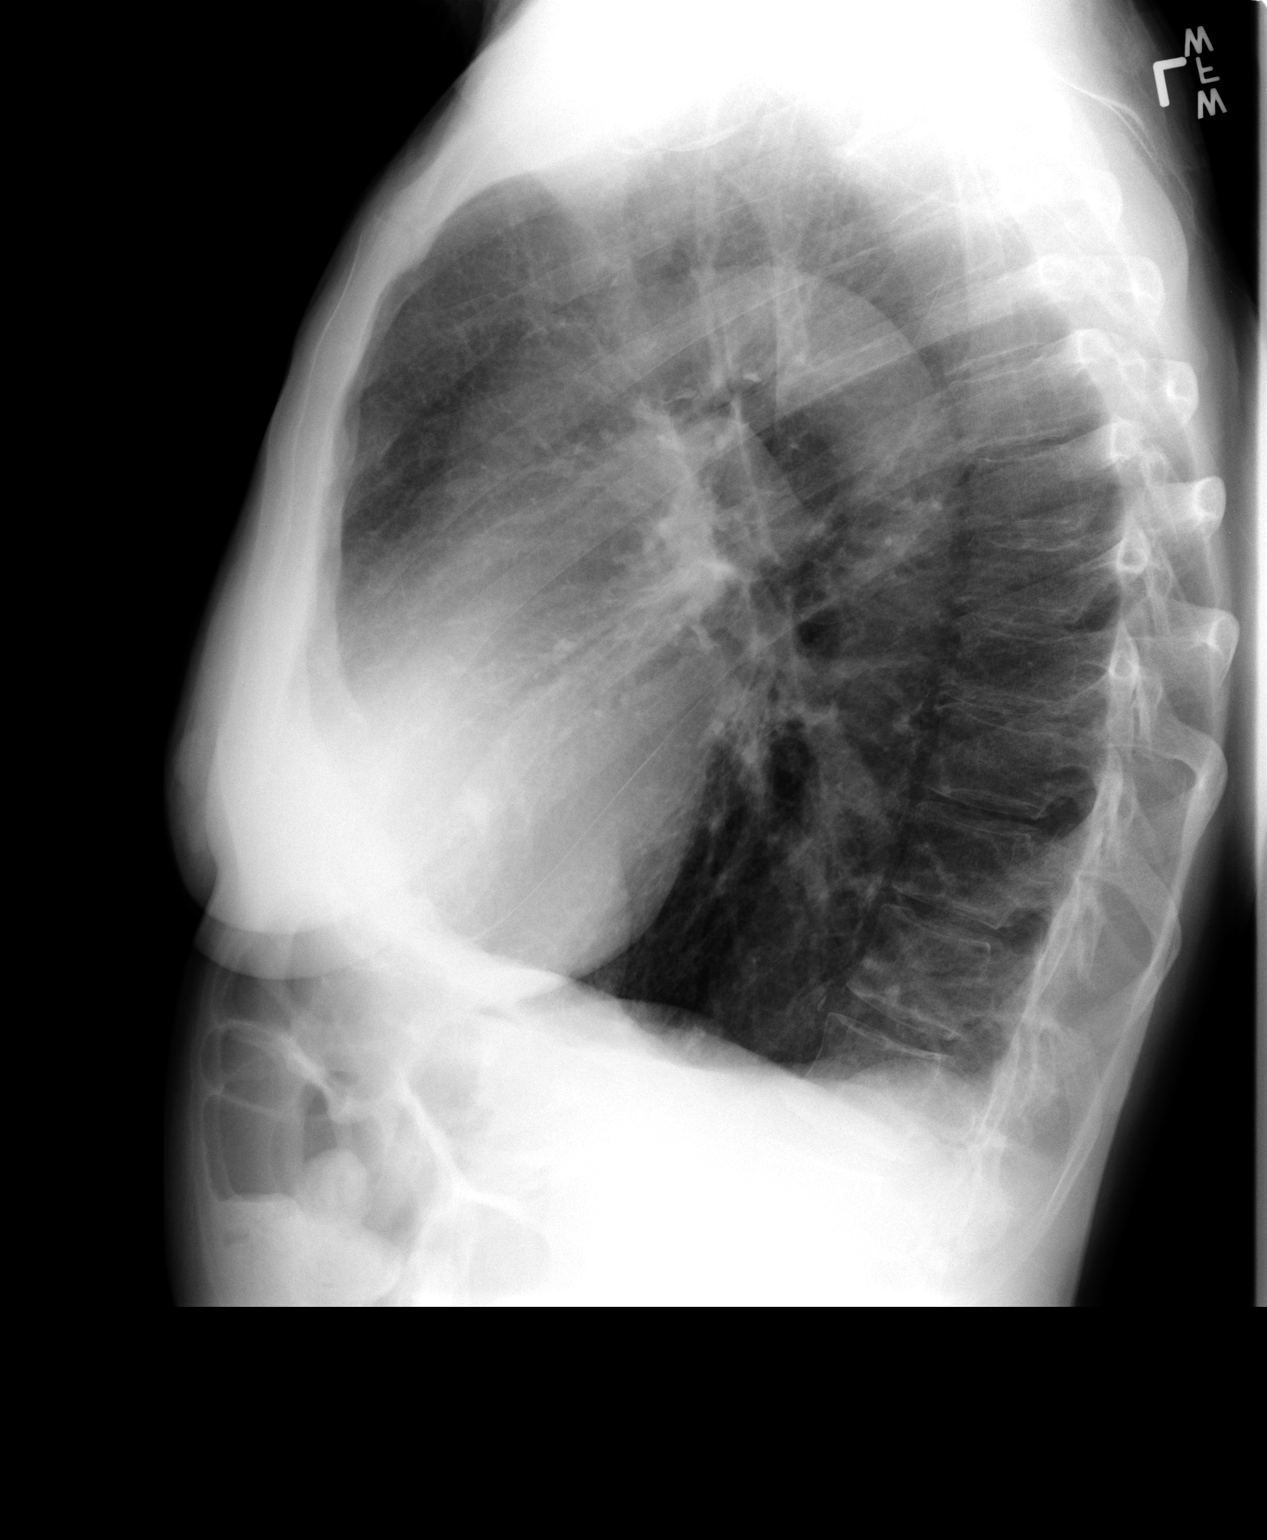

[2 of 2 positions shown; findings below may reference images not displayed]

FINDINGS: Heart size and pulmonary vascularity are normal. The lungs are
hyperinflated with flattening of the diaphragm consistent with
emphysema. No infiltrates or effusions. Dense calcification in
costal cartilage of the anterior aspect of the right seventh rib
simulates a nodule.

No acute osseous abnormality.
IMPRESSION: Severe emphysema.

## 2014-12-26 ENCOUNTER — Other Ambulatory Visit: Payer: Self-pay | Admitting: Family Medicine

## 2015-01-05 DIAGNOSIS — H2512 Age-related nuclear cataract, left eye: Secondary | ICD-10-CM | POA: Diagnosis not present

## 2015-01-05 DIAGNOSIS — H35363 Drusen (degenerative) of macula, bilateral: Secondary | ICD-10-CM | POA: Diagnosis not present

## 2015-01-05 DIAGNOSIS — H2513 Age-related nuclear cataract, bilateral: Secondary | ICD-10-CM | POA: Diagnosis not present

## 2015-01-06 ENCOUNTER — Telehealth: Payer: Self-pay | Admitting: Family Medicine

## 2015-01-06 NOTE — Telephone Encounter (Signed)
Error

## 2015-01-07 ENCOUNTER — Encounter: Payer: Self-pay | Admitting: Family Medicine

## 2015-01-07 ENCOUNTER — Ambulatory Visit (INDEPENDENT_AMBULATORY_CARE_PROVIDER_SITE_OTHER): Payer: Medicare Other | Admitting: Family Medicine

## 2015-01-07 VITALS — BP 130/70 | Temp 98.3°F | Ht 65.0 in | Wt 116.0 lb

## 2015-01-07 DIAGNOSIS — J028 Acute pharyngitis due to other specified organisms: Secondary | ICD-10-CM

## 2015-01-07 MED ORDER — ALPRAZOLAM 1 MG PO TABS: ORAL_TABLET | ORAL | Status: DC

## 2015-01-07 MED ORDER — AMOXICILLIN 500 MG PO TABS: 500.0000 mg | ORAL_TABLET | Freq: Three times a day (TID) | ORAL | Status: DC

## 2015-01-07 NOTE — Patient Instructions (Addendum)
Strep Throat Strep throat is an infection of the throat caused by a bacteria named Streptococcus pyogenes. Your health care provider may call the infection streptococcal "tonsillitis" or "pharyngitis" depending on whether there are signs of inflammation in the tonsils or back of the throat. Strep throat is most common in children aged 65-15 years during the cold months of the year, but it can occur in people of any age during any season. This infection is spread from person to person (contagious) through coughing, sneezing, or other close contact. SIGNS AND SYMPTOMS   Fever or chills.  Painful, swollen, red tonsils or throat.  Pain or difficulty when swallowing.  White or yellow spots on the tonsils or throat.  Swollen, tender lymph nodes or "glands" of the neck or under the jaw.  Red rash all over the body (rare). DIAGNOSIS  Many different infections can cause the same symptoms. A test must be done to confirm the diagnosis so the right treatment can be given. A "rapid strep test" can help your health care provider make the diagnosis in a few minutes. If this test is not available, a light swab of the infected area can be used for a throat culture test. If a throat culture test is done, results are usually available in a day or two. TREATMENT  Strep throat is treated with antibiotic medicine. HOME CARE INSTRUCTIONS   Gargle with 1 tsp of salt in 1 cup of warm water, 3-4 times per day or as needed for comfort.  Family members who also have a sore throat or fever should be tested for strep throat and treated with antibiotics if they have the strep infection.  Make sure everyone in your household washes their hands well.  Do not share food, drinking cups, or personal items that could cause the infection to spread to others.  You may need to eat a soft food diet until your sore throat gets better.  Drink enough water and fluids to keep your urine clear or pale yellow. This will help prevent  dehydration.  Get plenty of rest.  Stay home from school, day care, or work until you have been on antibiotics for 24 hours.  Take medicines only as directed by your health care provider.  Take your antibiotic medicine as directed by your health care provider. Finish it even if you start to feel better. SEEK MEDICAL CARE IF:   The glands in your neck continue to enlarge.  You develop a rash, cough, or earache.  You cough up green, yellow-brown, or bloody sputum.  You have pain or discomfort not controlled by medicines.  Your problems seem to be getting worse rather than better.  You have a fever. SEEK IMMEDIATE MEDICAL CARE IF:   You develop any new symptoms such as vomiting, severe headache, stiff or painful neck, chest pain, shortness of breath, or trouble swallowing.  You develop severe throat pain, drooling, or changes in your voice.  You develop swelling of the neck, or the skin on the neck becomes red and tender.  You develop signs of dehydration, such as fatigue, dry mouth, and decreased urination.  You become increasingly sleepy, or you cannot wake up completely. MAKE SURE YOU:  Understand these instructions.  Will watch your condition.  Will get help right away if you are not doing well or get worse. Document Released: 09/15/2000 Document Revised: 02/02/2014 Document Reviewed: 11/17/2010 [facility] Patient Information 2015 [place], [place]. This information is not intended to replace advice given to you by  your health care provider. Make sure you discuss any questions you have with your health care provider.   OTC Loratadine 10 mg one daily for allergies as well

## 2015-01-07 NOTE — Progress Notes (Signed)
   Subjective:    Patient ID: Amy Rubio, female    DOB: 03-20-1951, 64 y.o.   MRN: [id number]  Cough This is a new problem. Episode onset: 2 weeks ago. Associated symptoms include a fever, headaches, a sore throat and wheezing. Treatments tried: salt water, heating pad, throat drops.  Patient has no other concerns at this time.  PMH benign, COPD, smoker   Review of Systems  Constitutional: Positive for fever.  HENT: Positive for sore throat.   Respiratory: Positive for cough and wheezing.   Neurological: Positive for headaches.       Objective:   Physical Exam Throat erythematous neck supple eardrums normal lungs clear heart regular       Assessment & Plan:  Pharyngitis probable strep antibiotics prescribed History COPD encourage patient to quit smoking Needed refill on her Xanax Follow-up for regular office visit regarding chronic pain.  Warning signs discussed with patient regarding progression of illness and the need to go to ER if worse

## 2015-01-22 ENCOUNTER — Encounter (HOSPITAL_COMMUNITY)
Admission: RE | Admit: 2015-01-22 | Discharge: 2015-01-22 | Disposition: A | Discharge status: Home / Self Care | Payer: Medicare Other | Source: Ambulatory Visit | Attending: Ophthalmology | Admitting: Ophthalmology

## 2015-01-22 ENCOUNTER — Encounter (HOSPITAL_COMMUNITY): Payer: Self-pay

## 2015-01-22 DIAGNOSIS — H2512 Age-related nuclear cataract, left eye: Secondary | ICD-10-CM | POA: Insufficient documentation

## 2015-01-22 DIAGNOSIS — Z01818 Encounter for other preprocedural examination: Secondary | ICD-10-CM | POA: Diagnosis not present

## 2015-01-22 LAB — BASIC METABOLIC PANEL
Anion gap: 7 (ref 5–15)
BUN: 7 mg/dL (ref 6–23)
CO2: 29 mmol/L (ref 19–32)
Calcium: 8.7 mg/dL (ref 8.4–10.5)
Chloride: 98 mmol/L (ref 96–112)
Creatinine, Ser: 0.7 mg/dL (ref 0.50–1.10)
GFR calc Af Amer: >90 mL/min (ref >90)
GFR, EST NON AFRICAN AMERICAN: 90 mL/min — AB (ref >90)
Glucose, Bld: 104 mg/dL — ABNORMAL HIGH (ref 70–99)
Potassium: 3.7 mmol/L (ref 3.5–5.1)
SODIUM: 134 mmol/L — AB (ref 135–145)

## 2015-01-22 LAB — CBC
HCT: 35.5 % — ABNORMAL LOW (ref 36.0–46.0)
Hemoglobin: 12.1 g/dL (ref 12.0–15.0)
MCH: 34.3 pg — AB (ref 26.0–34.0)
MCHC: 34.1 g/dL (ref 30.0–36.0)
MCV: 100.6 fL — AB (ref 78.0–100.0)
Platelets: 303 10*3/uL (ref 150–400)
RBC: 3.53 MIL/uL — ABNORMAL LOW (ref 3.87–5.11)
RDW: 14.4 % (ref 11.5–15.5)
WBC: 4.7 10*3/uL (ref 4.0–10.5)

## 2015-01-22 NOTE — Patient Instructions (Signed)
Amy Rubio  01/22/2015   Your procedure is scheduled on:  01/26/2015  Report to [facility] at 7:45 AM.  Call this number if you have problems the morning of surgery: [phone]   Remember:   Do not eat food or drink liquids after midnight.   Take these medicines the morning of surgery with A SIP OF WATER: ALBUTEROL & SPIRIVA INHALERS (ALSO BRING WITH YOU),  FOSAMAX, PROZAC, GABAPENTIN, LISINOPRIL, PRILOSEC, PREDNISONE, OXYCODONE   Do not wear jewelry, make-up or nail polish.  Do not wear lotions, powders, or perfumes. You may wear deodorant.  Do not shave 48 hours prior to surgery. Men may shave face and neck.  Do not bring valuables to the hospital.  [facility] [place] [facility] is not responsible  for any belongings or valuables.               Contacts, dentures or bridgework may not be worn into surgery.  Leave suitcase in the car. After surgery it may be brought to your room.  For patients admitted to the hospital, discharge time is determined by your treatment team.               Patients discharged the day of surgery will not be allowed to drive home.  Name and phone number of your driver:   Special Instructions: N/A   Please read over the following fact sheets that you were given: Anesthesia Post-op Instructions   PATIENT INSTRUCTIONS POST-ANESTHESIA  IMMEDIATELY FOLLOWING SURGERY:  Do not drive or operate machinery for the first twenty four hours after surgery.  Do not make any important decisions for twenty four hours after surgery or while taking narcotic pain medications or sedatives.  If you develop intractable nausea and vomiting or a severe headache please notify your doctor immediately.  FOLLOW-UP:  Please make an appointment with your surgeon as instructed. You do not need to follow up with anesthesia unless specifically instructed to do so.  WOUND CARE INSTRUCTIONS (if applicable):  Keep a dry clean dressing on the anesthesia/puncture wound site if there is drainage.  Once the  wound has quit draining you may leave it open to air.  Generally you should leave the bandage intact for twenty four hours unless there is drainage.  If the epidural site drains for more than 36-48 hours please call the anesthesia department.  QUESTIONS?:  Please feel free to call your physician or the hospital operator if you have any questions, and they will be happy to assist you.      Cataract Surgery  A cataract is a clouding of the lens of the eye. When a lens becomes cloudy, vision is reduced based on the degree and nature of the clouding. Surgery may be needed to improve vision. Surgery removes the cloudy lens and usually replaces it with a substitute lens (intraocular lens, IOL). LET YOUR EYE DOCTOR KNOW ABOUT:  Allergies to food or medicine.  Medicines taken including herbs, eye drops, over-the-counter medicines, and creams.  Use of steroids (by mouth or creams).  Previous problems with anesthetics or numbing medicine.  History of bleeding problems or blood clots.  Previous surgery.  Other health problems, including diabetes and kidney problems.  Possibility of pregnancy, if this applies. RISKS AND COMPLICATIONS  Infection.  Inflammation of the eyeball (endophthalmitis) that can spread to both eyes (sympathetic ophthalmia).  Poor wound healing.  If an IOL is inserted, it can later fall out of proper position. This is very uncommon.  Clouding of the  part of your eye that holds an IOL in place. This is called an "after-cataract." These are uncommon but easily treated. BEFORE THE PROCEDURE  Do not eat or drink anything except small amounts of water for 8 to 12 before your surgery, or as directed by your caregiver.  Unless you are told otherwise, continue any eye drops you have been prescribed.  Talk to your primary caregiver about all other medicines that you take (both prescription and nonprescription). In some cases, you may need to stop or change medicines near the  time of your surgery. This is most important if you are taking blood-thinning medicine.Do not stop medicines unless you are told to do so.  Arrange for someone to drive you to and from the procedure.  Do not put contact lenses in either eye on the day of your surgery. PROCEDURE There is more than one method for safely removing a cataract. Your doctor can explain the differences and help determine which is best for you. Phacoemulsification surgery is the most common form of cataract surgery.  An injection is given behind the eye or eye drops are given to make this a painless procedure.  A small cut (incision) is made on the edge of the clear, dome-shaped surface that covers the front of the eye (cornea).  A tiny probe is painlessly inserted into the eye. This device gives off ultrasound waves that soften and break up the cloudy center of the lens. This makes it easier for the cloudy lens to be removed by suction.  An IOL may be implanted.  The normal lens of the eye is covered by a clear capsule. Part of that capsule is intentionally left in the eye to support the IOL.  Your surgeon may or may not use stitches to close the incision. There are other forms of cataract surgery that require a larger incision and stitches to close the eye. This approach is taken in cases where the doctor feels that the cataract cannot be easily removed using phacoemulsification. AFTER THE PROCEDURE  When an IOL is implanted, it does not need care. It becomes a permanent part of your eye and cannot be seen or felt.  Your doctor will schedule follow-up exams to check on your progress.  Review your other medicines with your doctor to see which can be resumed after surgery.  Use eye drops or take medicine as prescribed by your doctor. Document Released: 09/07/2011 Document Revised: 02/02/2014 Document Reviewed: 09/07/2011 [facility] Patient Information 2015 [place], [place]. This information is not intended to  replace advice given to you by your health care provider. Make sure you discuss any questions you have with your health care provider.

## 2015-01-25 MED ORDER — KETOROLAC TROMETHAMINE 0.5 % OP SOLN
OPHTHALMIC | Status: AC
  Filled 2015-01-25: qty 5

## 2015-01-25 MED ORDER — PHENYLEPHRINE HCL 2.5 % OP SOLN
OPHTHALMIC | Status: AC
  Filled 2015-01-25: qty 15

## 2015-01-25 MED ORDER — TETRACAINE HCL 0.5 % OP SOLN
OPHTHALMIC | Status: AC
  Filled 2015-01-25: qty 2

## 2015-01-25 MED ORDER — CYCLOPENTOLATE-PHENYLEPHRINE OP SOLN OPTIME - NO CHARGE
OPHTHALMIC | Status: AC
  Filled 2015-01-25: qty 2

## 2015-01-26 ENCOUNTER — Encounter (HOSPITAL_COMMUNITY)
Admission: RE | Disposition: A | Discharge status: Home / Self Care | Payer: Self-pay | Source: Ambulatory Visit | Attending: Ophthalmology

## 2015-01-26 ENCOUNTER — Ambulatory Visit (HOSPITAL_COMMUNITY): Payer: Medicare Other | Admitting: Anesthesiology

## 2015-01-26 ENCOUNTER — Ambulatory Visit (HOSPITAL_COMMUNITY)
Admission: RE | Admit: 2015-01-26 | Discharge: 2015-01-26 | Disposition: A | Discharge status: Home / Self Care | Payer: Medicare Other | Source: Ambulatory Visit | Attending: Ophthalmology | Admitting: Ophthalmology

## 2015-01-26 ENCOUNTER — Encounter (HOSPITAL_COMMUNITY): Payer: Self-pay | Admitting: *Deleted

## 2015-01-26 DIAGNOSIS — Z79899 Other long term (current) drug therapy: Secondary | ICD-10-CM | POA: Diagnosis not present

## 2015-01-26 DIAGNOSIS — I10 Essential (primary) hypertension: Secondary | ICD-10-CM | POA: Insufficient documentation

## 2015-01-26 DIAGNOSIS — J449 Chronic obstructive pulmonary disease, unspecified: Secondary | ICD-10-CM | POA: Diagnosis not present

## 2015-01-26 DIAGNOSIS — M1990 Unspecified osteoarthritis, unspecified site: Secondary | ICD-10-CM | POA: Insufficient documentation

## 2015-01-26 DIAGNOSIS — F17200 Nicotine dependence, unspecified, uncomplicated: Secondary | ICD-10-CM | POA: Insufficient documentation

## 2015-01-26 DIAGNOSIS — H269 Unspecified cataract: Secondary | ICD-10-CM | POA: Diagnosis not present

## 2015-01-26 DIAGNOSIS — H2512 Age-related nuclear cataract, left eye: Secondary | ICD-10-CM | POA: Diagnosis not present

## 2015-01-26 HISTORY — PX: CATARACT EXTRACTION W/PHACO: SHX586

## 2015-01-26 SURGERY — PHACOEMULSIFICATION, CATARACT, WITH IOL INSERTION
Anesthesia: Monitor Anesthesia Care | Site: Eye | Laterality: Left

## 2015-01-26 MED ORDER — TETRACAINE 0.5 % OP SOLN OPTIME - NO CHARGE
OPHTHALMIC | Status: DC | PRN
Start: 2015-01-26 — End: 2015-01-26
  Administered 2015-01-26: 1 [drp] via OPHTHALMIC

## 2015-01-26 MED ORDER — PROVISC 10 MG/ML IO SOLN
INTRAOCULAR | Status: DC | PRN
  Administered 2015-01-26: 0.85 mL via INTRAOCULAR

## 2015-01-26 MED ORDER — ONDANSETRON HCL 4 MG/2ML IJ SOLN: 4.0000 mg | Freq: Once | INTRAMUSCULAR | Status: DC | PRN

## 2015-01-26 MED ORDER — MIDAZOLAM HCL 2 MG/2ML IJ SOLN
INTRAMUSCULAR | Status: AC
  Filled 2015-01-26: qty 2

## 2015-01-26 MED ORDER — BSS IO SOLN
INTRAOCULAR | Status: DC | PRN
  Administered 2015-01-26: 15 mL via INTRAOCULAR

## 2015-01-26 MED ORDER — MIDAZOLAM HCL 2 MG/2ML IJ SOLN
INTRAMUSCULAR | Status: DC | PRN
  Administered 2015-01-26: 2 mg via INTRAVENOUS

## 2015-01-26 MED ORDER — MIDAZOLAM HCL 2 MG/2ML IJ SOLN
1.0000 mg | INTRAMUSCULAR | Status: DC | PRN
  Administered 2015-01-26: 2 mg via INTRAVENOUS

## 2015-01-26 MED ORDER — MEPERIDINE HCL 50 MG/ML IJ SOLN: 6.2500 mg | Freq: Once | INTRAMUSCULAR | Status: DC

## 2015-01-26 MED ORDER — EPINEPHRINE HCL 1 MG/ML IJ SOLN
INTRAMUSCULAR | Status: AC
  Filled 2015-01-26: qty 1

## 2015-01-26 MED ORDER — FENTANYL CITRATE (PF) 100 MCG/2ML IJ SOLN
INTRAMUSCULAR | Status: AC
  Filled 2015-01-26: qty 2

## 2015-01-26 MED ORDER — EPINEPHRINE HCL 1 MG/ML IJ SOLN
INTRAOCULAR | Status: DC | PRN
  Administered 2015-01-26: 09:00:00

## 2015-01-26 MED ORDER — TETRACAINE HCL 0.5 % OP SOLN
1.0000 [drp] | OPHTHALMIC | Status: AC
  Administered 2015-01-26 (×3): 1 [drp] via OPHTHALMIC

## 2015-01-26 MED ORDER — CYCLOPENTOLATE-PHENYLEPHRINE 0.2-1 % OP SOLN
1.0000 [drp] | OPHTHALMIC | Status: AC
  Administered 2015-01-26 (×3): 1 [drp] via OPHTHALMIC

## 2015-01-26 MED ORDER — KETOROLAC TROMETHAMINE 0.5 % OP SOLN
1.0000 [drp] | OPHTHALMIC | Status: AC
  Administered 2015-01-26 (×3): 1 [drp] via OPHTHALMIC

## 2015-01-26 MED ORDER — PHENYLEPHRINE HCL 2.5 % OP SOLN
1.0000 [drp] | OPHTHALMIC | Status: AC
  Administered 2015-01-26 (×3): 1 [drp] via OPHTHALMIC

## 2015-01-26 MED ORDER — LACTATED RINGERS IV SOLN
INTRAVENOUS | Status: DC
  Administered 2015-01-26: 1000 mL via INTRAVENOUS

## 2015-01-26 MED ORDER — FENTANYL CITRATE (PF) 100 MCG/2ML IJ SOLN: 25.0000 ug | INTRAMUSCULAR | Status: DC | PRN

## 2015-01-26 MED ORDER — FENTANYL CITRATE (PF) 100 MCG/2ML IJ SOLN
25.0000 ug | INTRAMUSCULAR | Status: AC
  Administered 2015-01-26 (×2): 25 ug via INTRAVENOUS

## 2015-01-26 SURGICAL SUPPLY — 10 items
CLOTH BEACON ORANGE TIMEOUT ST (SAFETY) ×3 IMPLANT
EYE SHIELD UNIVERSAL CLEAR (GAUZE/BANDAGES/DRESSINGS) ×3 IMPLANT
GLOVE BIO SURGEON STRL SZ 6.5 (GLOVE) ×2 IMPLANT
GLOVE BIO SURGEONS STRL SZ 6.5 (GLOVE) ×1
GLOVE EXAM NITRILE MD LF STRL (GLOVE) ×3 IMPLANT
LENS ALC ACRYL/TECN (Ophthalmic Related) ×3 IMPLANT
PAD ARMBOARD 7.5X6 YLW CONV (MISCELLANEOUS) ×3 IMPLANT
TAPE SURG TRANSPARENT 2IN (GAUZE/BANDAGES/DRESSINGS) ×1 IMPLANT
TAPE TRANSPARENT 2IN (GAUZE/BANDAGES/DRESSINGS) ×2
WATER STERILE IRR 250ML POUR (IV SOLUTION) ×3 IMPLANT

## 2015-01-26 NOTE — Discharge Instructions (Signed)
Amy Rubio  01/26/2015           [facility] Instructions [place] [address]      1. Avoid closing eyes tightly. One often closes the eye tightly when laughing, talking, sneezing, coughing or if they feel irritated. At these times, you should be careful not to close your eyes tightly.  2. Instill eye drops as instructed. To instill drops in your eye, open it, look up and have someone gently pull the lower lid down and instill a couple of drops inside the lower lid.  3. Do not touch upper lid.  4. Take Advil or Tylenol for pain.  5. You may use either eye for near work, such as reading or sewing and you may watch television.  6. You may have your hair done at the beauty parlor at any time.  7. Wear dark glasses with or without your own glasses if you are in bright light.  8. Call our office at [phone] or [phone] if you have sharp pain in your eye or unusual symptoms.  9. Do not be concerned because vision in the operative eye is not good. It will not be good, no matter how successful the operation, until you get a special lens for it. Your old glasses will not be suited to the new eye that was operated on and you will not be ready for a new lens for about a month.  10. Follow up at the [facility] office. Today between 2 and 3:00.    I have received a copy of the above instructions and will follow them.

## 2015-01-26 NOTE — Transfer of Care (Signed)
Immediate Anesthesia Transfer of Care Note  Patient: Amy Rubio  Procedure(s) Performed: Procedure(s) (LRB): CATARACT EXTRACTION PHACO AND INTRAOCULAR LENS PLACEMENT (IOC) (Left)  Patient Location: Shortstay  Anesthesia Type: MAC  Level of Consciousness: awake  Airway & Oxygen Therapy: Patient Spontanous Breathing   Post-op Assessment: Report given to PACU RN, Post -op Vital signs reviewed and stable and Patient moving all extremities  Post vital signs: Reviewed and stable  Complications: No apparent anesthesia complications

## 2015-01-26 NOTE — Anesthesia Procedure Notes (Signed)
Procedure Name: MAC Date/Time: 01/26/2015 8:43 AM Performed by: Vista Deck Pre-anesthesia Checklist: Patient identified, Emergency Drugs available, Suction available, Timeout performed and Patient being monitored Patient Re-evaluated:Patient Re-evaluated prior to inductionOxygen Delivery Method: Nasal Cannula

## 2015-01-26 NOTE — Anesthesia Postprocedure Evaluation (Signed)
  Anesthesia Post-op Note  Patient: Amy Rubio  Procedure(s) Performed: Procedure(s) (LRB): CATARACT EXTRACTION PHACO AND INTRAOCULAR LENS PLACEMENT (IOC) (Left)  Patient Location:  Short Stay  Anesthesia Type: MAC  Level of Consciousness: awake  Airway and Oxygen Therapy: Patient Spontanous Breathing  Post-op Pain: none  Post-op Assessment: Post-op Vital signs reviewed, Patient's Cardiovascular Status Stable, Respiratory Function Stable, Patent Airway, No signs of Nausea or vomiting and Pain level controlled  Post-op Vital Signs: Reviewed and stable  Complications: No apparent anesthesia complications

## 2015-01-26 NOTE — Anesthesia Preprocedure Evaluation (Signed)
Anesthesia Evaluation  Patient identified by MRN, date of birth, ID band Patient awake    Reviewed: Allergy & Precautions, NPO status , Patient's Chart, lab work & pertinent test results  Airway Mallampati: III  TM Distance: >3 FB Neck ROM: Full    Dental  (+) Teeth Intact   Pulmonary COPDCurrent Smoker,  breath sounds clear to auscultation        Cardiovascular hypertension, Pt. on medications Rhythm:Regular Rate:Normal     Neuro/Psych  Headaches, PSYCHIATRIC DISORDERS Anxiety    GI/Hepatic   Endo/Other    Renal/GU      Musculoskeletal  (+) Arthritis -,   Abdominal   Peds  Hematology   Anesthesia Other Findings   Reproductive/Obstetrics                             Anesthesia Physical Anesthesia Plan  ASA: III  Anesthesia Plan: MAC   Post-op Pain Management:    Induction: Intravenous  Airway Management Planned: Nasal Cannula  Additional Equipment:   Intra-op Plan:   Post-operative Plan:   Informed Consent: I have reviewed the patients History and Physical, chart, labs and discussed the procedure including the risks, benefits and alternatives for the proposed anesthesia with the patient or authorized representative who has indicated his/her understanding and acceptance.     Plan Discussed with:   Anesthesia Plan Comments:         Anesthesia Quick Evaluation

## 2015-01-26 NOTE — H&P (Signed)
The patient was re examined and there is no change in the patients condition since the original H and P. 

## 2015-01-26 NOTE — Op Note (Signed)
Patient brought to the operating room and prepped and draped in the usual manner.  Lid speculum inserted in left eye.  Stab incision made at the twelve o'clock position.  Provisc instilled in the anterior chamber.   A 2.4 mm. Stab incision was made temporally.  An anterior capsulotomy was done with a bent 25 gauge needle.  The nucleus was hydrodissected.  The Phaco tip was inserted in the anterior chamber and the nucleus was emulsified.  CDE was 7.72.  The cortical material was then removed with the I and A tip.  Posterior capsule was the polished.  The anterior chamber was deepened with Provisc.  A 19.0 Diopter Alcon SN60WF IOL was then inserted in the capsular bag.  Provisc was then removed with the I and A tip.  The wound was then hydrated.  Patient sent to the Recovery Room in good condition with follow up in my office.  Preoperative Diagnosis:  Nuclear Cataract OS Postoperative Diagnosis:  Same Procedure name: Kelman Phacoemulsification OS with IOL

## 2015-01-27 ENCOUNTER — Encounter (HOSPITAL_COMMUNITY): Payer: Self-pay | Admitting: Ophthalmology

## 2015-02-15 ENCOUNTER — Telehealth: Payer: Self-pay | Admitting: Family Medicine

## 2015-02-15 NOTE — Telephone Encounter (Signed)
oxyCODONE-acetaminophen (PERCOCET) 10-325 MG per tablet  Pt states she has an appt on 5/19 but will be out of meds on the 18th  She wants to know if you will go ahead an fill them on the 18th before  Her appt on the 19th please

## 2015-02-16 DIAGNOSIS — H2511 Age-related nuclear cataract, right eye: Secondary | ICD-10-CM | POA: Diagnosis not present

## 2015-02-16 MED ORDER — OXYCODONE-ACETAMINOPHEN 10-325 MG PO TABS: 1.0000 | ORAL_TABLET | ORAL | Status: DC | PRN

## 2015-02-16 NOTE — Telephone Encounter (Addendum)
Rx up front for patient pick up. Patient notified. 

## 2015-02-16 NOTE — Telephone Encounter (Signed)
May have script as requested,keep ov

## 2015-02-18 ENCOUNTER — Ambulatory Visit (INDEPENDENT_AMBULATORY_CARE_PROVIDER_SITE_OTHER): Payer: Medicare Other | Admitting: Family Medicine

## 2015-02-18 ENCOUNTER — Encounter (HOSPITAL_COMMUNITY)
Admission: RE | Admit: 2015-02-18 | Discharge: 2015-02-18 | Disposition: A | Discharge status: Home / Self Care | Payer: Medicare Other | Source: Ambulatory Visit | Attending: Ophthalmology | Admitting: Ophthalmology

## 2015-02-18 ENCOUNTER — Encounter: Payer: Self-pay | Admitting: Family Medicine

## 2015-02-18 VITALS — BP 122/70 | Ht 65.0 in | Wt 121.5 lb

## 2015-02-18 DIAGNOSIS — M545 Low back pain: Secondary | ICD-10-CM | POA: Diagnosis not present

## 2015-02-18 DIAGNOSIS — Z7189 Other specified counseling: Secondary | ICD-10-CM | POA: Diagnosis not present

## 2015-02-18 DIAGNOSIS — G8929 Other chronic pain: Secondary | ICD-10-CM | POA: Diagnosis not present

## 2015-02-18 MED ORDER — TIOTROPIUM BROMIDE MONOHYDRATE 18 MCG IN CAPS: ORAL_CAPSULE | RESPIRATORY_TRACT | Status: DC

## 2015-02-18 MED ORDER — OXYCODONE-ACETAMINOPHEN 10-325 MG PO TABS: 1.0000 | ORAL_TABLET | ORAL | Status: DC | PRN

## 2015-02-18 MED ORDER — LISINOPRIL-HYDROCHLOROTHIAZIDE 20-25 MG PO TABS: ORAL_TABLET | ORAL | Status: DC

## 2015-02-18 MED ORDER — ALENDRONATE SODIUM 70 MG PO TABS: 70.0000 mg | ORAL_TABLET | ORAL | Status: DC

## 2015-02-18 MED ORDER — OXYCODONE-ACETAMINOPHEN 10-325 MG PO TABS
1.0000 | ORAL_TABLET | ORAL | Status: DC | PRN
Start: 2015-02-18 — End: 2015-02-18

## 2015-02-18 NOTE — Patient Instructions (Signed)

## 2015-02-18 NOTE — Progress Notes (Signed)
   Subjective:    Patient ID: Amy Rubio, female    DOB: 1950/10/18, 64 y.o.   MRN: [id number]  HPI This patient was seen today for chronic pain  The medication list was reviewed and updated  -Compliance with pain medication: yes  The patient was advised the importance of maintaining medication and not using illegal substances with these.  Refills needed: yes  The patient was educated that we can provide 3 monthly scripts for their medication, it is their responsibility to follow the instructions.  Side effects or complications from medications: none  Patient is aware that pain medications are meant to minimize the severity of the pain to allow their pain levels to improve to allow for better function. They are aware of that pain medications cannot totally remove their pain.  Due for UDT ( at least once per year) : today   Patient states that she has no concerns at this time.         Review of Systems Patient relates some shortness of breath denies chest tightness pressure pain relates a lot of pain in her lower back and in her hands    Objective:   Physical Exam  Lungs clear hearts regular arthritic changes noted in the hands of osteoarthritis subjected discomfort in her lower back negative straight leg raise      Assessment & Plan:  COPD patient was encouraged quit smoking to lessen the risk of cancer  Chronic pain UDT completed today await results 3 prescriptions written patient may have these upon was a normal result  To follow-up in approximately 3-4 months

## 2015-02-22 MED ORDER — KETOROLAC TROMETHAMINE 0.5 % OP SOLN
OPHTHALMIC | Status: AC
  Filled 2015-02-22: qty 5

## 2015-02-22 MED ORDER — CYCLOPENTOLATE-PHENYLEPHRINE OP SOLN OPTIME - NO CHARGE
OPHTHALMIC | Status: AC
  Filled 2015-02-22: qty 2

## 2015-02-22 MED ORDER — PHENYLEPHRINE HCL 2.5 % OP SOLN
OPHTHALMIC | Status: AC
  Filled 2015-02-22: qty 15

## 2015-02-22 MED ORDER — TETRACAINE HCL 0.5 % OP SOLN
OPHTHALMIC | Status: AC
  Filled 2015-02-22: qty 2

## 2015-02-23 ENCOUNTER — Ambulatory Visit (HOSPITAL_COMMUNITY): Payer: Medicare Other | Admitting: Anesthesiology

## 2015-02-23 ENCOUNTER — Ambulatory Visit (HOSPITAL_COMMUNITY)
Admission: RE | Admit: 2015-02-23 | Discharge: 2015-02-23 | Disposition: A | Discharge status: Home / Self Care | Payer: Medicare Other | Source: Ambulatory Visit | Attending: Ophthalmology | Admitting: Ophthalmology

## 2015-02-23 ENCOUNTER — Encounter (HOSPITAL_COMMUNITY)
Admission: RE | Disposition: A | Discharge status: Home / Self Care | Payer: Self-pay | Source: Ambulatory Visit | Attending: Ophthalmology

## 2015-02-23 ENCOUNTER — Encounter (HOSPITAL_COMMUNITY): Payer: Self-pay | Admitting: Anesthesiology

## 2015-02-23 DIAGNOSIS — F17200 Nicotine dependence, unspecified, uncomplicated: Secondary | ICD-10-CM | POA: Diagnosis not present

## 2015-02-23 DIAGNOSIS — Z79899 Other long term (current) drug therapy: Secondary | ICD-10-CM | POA: Diagnosis not present

## 2015-02-23 DIAGNOSIS — M1990 Unspecified osteoarthritis, unspecified site: Secondary | ICD-10-CM | POA: Insufficient documentation

## 2015-02-23 DIAGNOSIS — I10 Essential (primary) hypertension: Secondary | ICD-10-CM | POA: Insufficient documentation

## 2015-02-23 DIAGNOSIS — J449 Chronic obstructive pulmonary disease, unspecified: Secondary | ICD-10-CM | POA: Insufficient documentation

## 2015-02-23 DIAGNOSIS — H2511 Age-related nuclear cataract, right eye: Secondary | ICD-10-CM | POA: Diagnosis not present

## 2015-02-23 DIAGNOSIS — H269 Unspecified cataract: Secondary | ICD-10-CM | POA: Diagnosis not present

## 2015-02-23 HISTORY — PX: CATARACT EXTRACTION W/PHACO: SHX586

## 2015-02-23 SURGERY — PHACOEMULSIFICATION, CATARACT, WITH IOL INSERTION
Anesthesia: Monitor Anesthesia Care | Site: Eye | Laterality: Right

## 2015-02-23 MED ORDER — FENTANYL CITRATE (PF) 100 MCG/2ML IJ SOLN
25.0000 ug | INTRAMUSCULAR | Status: AC
  Administered 2015-02-23 (×2): 25 ug via INTRAVENOUS

## 2015-02-23 MED ORDER — FENTANYL CITRATE (PF) 100 MCG/2ML IJ SOLN
INTRAMUSCULAR | Status: AC
  Filled 2015-02-23: qty 2

## 2015-02-23 MED ORDER — MIDAZOLAM HCL 2 MG/2ML IJ SOLN
1.0000 mg | INTRAMUSCULAR | Status: DC | PRN
  Administered 2015-02-23 (×2): 2 mg via INTRAVENOUS
  Filled 2015-02-23: qty 2

## 2015-02-23 MED ORDER — LACTATED RINGERS IV SOLN
INTRAVENOUS | Status: DC
  Administered 2015-02-23: 1000 mL via INTRAVENOUS

## 2015-02-23 MED ORDER — TETRACAINE 0.5 % OP SOLN OPTIME - NO CHARGE
OPHTHALMIC | Status: DC | PRN
  Administered 2015-02-23: 1 [drp] via OPHTHALMIC

## 2015-02-23 MED ORDER — PHENYLEPHRINE HCL 2.5 % OP SOLN
1.0000 [drp] | OPHTHALMIC | Status: AC | PRN
  Administered 2015-02-23 (×3): 1 [drp] via OPHTHALMIC

## 2015-02-23 MED ORDER — PROVISC 10 MG/ML IO SOLN
INTRAOCULAR | Status: DC | PRN
  Administered 2015-02-23: 0.85 mL via INTRAOCULAR

## 2015-02-23 MED ORDER — KETOROLAC TROMETHAMINE 0.5 % OP SOLN
1.0000 [drp] | OPHTHALMIC | Status: AC | PRN
  Administered 2015-02-23 (×3): 1 [drp] via OPHTHALMIC

## 2015-02-23 MED ORDER — EPINEPHRINE HCL 1 MG/ML IJ SOLN
INTRAOCULAR | Status: DC | PRN
  Administered 2015-02-23: 500 mL

## 2015-02-23 MED ORDER — BSS IO SOLN
INTRAOCULAR | Status: DC | PRN
  Administered 2015-02-23: 15 mL

## 2015-02-23 MED ORDER — EPINEPHRINE HCL 1 MG/ML IJ SOLN
INTRAMUSCULAR | Status: AC
  Filled 2015-02-23: qty 1

## 2015-02-23 MED ORDER — TETRACAINE HCL 0.5 % OP SOLN
1.0000 [drp] | OPHTHALMIC | Status: AC | PRN
  Administered 2015-02-23 (×3): 1 [drp] via OPHTHALMIC

## 2015-02-23 MED ORDER — CYCLOPENTOLATE-PHENYLEPHRINE 0.2-1 % OP SOLN
1.0000 [drp] | OPHTHALMIC | Status: AC | PRN
  Administered 2015-02-23 (×3): 1 [drp] via OPHTHALMIC

## 2015-02-23 MED ORDER — MIDAZOLAM HCL 2 MG/2ML IJ SOLN
INTRAMUSCULAR | Status: AC
  Filled 2015-02-23: qty 2

## 2015-02-23 SURGICAL SUPPLY — 10 items

## 2015-02-23 NOTE — Anesthesia Preprocedure Evaluation (Signed)
Anesthesia Evaluation  Patient identified by MRN, date of birth, ID band Patient awake    Reviewed: Allergy & Precautions, NPO status , Patient's Chart, lab work & pertinent test results  Airway Mallampati: III  TM Distance: >3 FB Neck ROM: Full    Dental  (+) Teeth Intact   Pulmonary COPDCurrent Smoker,  breath sounds clear to auscultation        Cardiovascular hypertension, Pt. on medications Rhythm:Regular Rate:Normal     Neuro/Psych  Headaches, PSYCHIATRIC DISORDERS Anxiety    GI/Hepatic   Endo/Other    Renal/GU      Musculoskeletal  (+) Arthritis -,   Abdominal   Peds  Hematology   Anesthesia Other Findings   Reproductive/Obstetrics                             Anesthesia Physical Anesthesia Plan  ASA: III  Anesthesia Plan: MAC   Post-op Pain Management:    Induction: Intravenous  Airway Management Planned: Nasal Cannula  Additional Equipment:   Intra-op Plan:   Post-operative Plan:   Informed Consent: I have reviewed the patients History and Physical, chart, labs and discussed the procedure including the risks, benefits and alternatives for the proposed anesthesia with the patient or authorized representative who has indicated his/her understanding and acceptance.     Plan Discussed with:   Anesthesia Plan Comments:         Anesthesia Quick Evaluation

## 2015-02-23 NOTE — Transfer of Care (Signed)
Immediate Anesthesia Transfer of Care Note  Patient: Amy Rubio  Procedure(s) Performed: Procedure(s) with comments: CATARACT EXTRACTION PHACO AND INTRAOCULAR LENS PLACEMENT (IOC) (Right) - CDE:10.68  Patient Location: Short Stay  Anesthesia Type:MAC  Level of Consciousness: awake  Airway & Oxygen Therapy: Patient Spontanous Breathing  Post-op Assessment: Report given to RN  Post vital signs: Reviewed  Last Vitals:  Filed Vitals:   02/23/15 1205  BP: 142/53  Pulse:   Temp:   Resp: 17    Complications: No apparent anesthesia complications

## 2015-02-23 NOTE — Op Note (Signed)
Patient brought to the operating room and prepped and draped in the usual manner.  Lid speculum inserted in right eye.  Stab incision made at the twelve o'clock position.  Provisc instilled in the anterior chamber.   A 2.4 mm. Stab incision was made temporally.  An anterior capsulotomy was done with a bent 25 gauge needle.  The nucleus was hydrodissected.  The Phaco tip was inserted in the anterior chamber and the nucleus was emulsified.  CDE was 10.68.  The cortical material was then removed with the I and A tip.  Posterior capsule was the polished.  The anterior chamber was deepened with Provisc.  A 19.5 Diopter Alcon SN60WF IOL was then inserted in the capsular bag.  Provisc was then removed with the I and A tip.  The wound was then hydrated.  Patient sent to the Recovery Room in good condition with follow up in my office.  Preoperative Diagnosis:  Nuclear Cataract OD Postoperative Diagnosis:  Same Procedure name: Kelman Phacoemulsification OD with IOL

## 2015-02-23 NOTE — Discharge Instructions (Signed)
° ° ° °   Amy Rubio  02/23/2015           [facility] Instructions [place] [address]      1. Avoid closing eyes tightly. One often closes the eye tightly when laughing, talking, sneezing, coughing or if they feel irritated. At these times, you should be careful not to close your eyes tightly.  2. Instill eye drops as instructed. To instill drops in your eye, open it, look up and have someone gently pull the lower lid down and instill a couple of drops inside the lower lid.  3. Do not touch upper lid.  4. Take Advil or Tylenol for pain.  5. You may use either eye for near work, such as reading or sewing and you may watch television.  6. You may have your hair done at the beauty parlor at any time.  7. Wear dark glasses with or without your own glasses if you are in bright light.  8. Call our office at [phone] or [phone] if you have sharp pain in your eye or unusual symptoms.  9. Do not be concerned because vision in the operative eye is not good. It will not be good, no matter how successful the operation, until you get a special lens for it. Your old glasses will not be suited to the new eye that was operated on and you will not be ready for a new lens for about a month.  10. Follow up at the [facility] office.    I have received a copy of the above instructions and will follow them.      PATIENT INSTRUCTIONS POST-ANESTHESIA  IMMEDIATELY FOLLOWING SURGERY:  Do not drive or operate machinery for the first twenty four hours after surgery.  Do not make any important decisions for twenty four hours after surgery or while taking narcotic pain medications or sedatives.  If you develop intractable nausea and vomiting or a severe headache please notify your doctor immediately.  FOLLOW-UP:  Please make an appointment with your surgeon as instructed. You do not need to follow up with anesthesia unless specifically instructed to  do so.  WOUND CARE INSTRUCTIONS (if applicable):  Keep a dry clean dressing on the anesthesia/puncture wound site if there is drainage.  Once the wound has quit draining you may leave it open to air.  Generally you should leave the bandage intact for twenty four hours unless there is drainage.  If the epidural site drains for more than 36-48 hours please call the anesthesia department.  QUESTIONS?:  Please feel free to call your physician or the hospital operator if you have any questions, and they will be happy to assist you.

## 2015-02-23 NOTE — H&P (Signed)
The patient was re examined and there is no change in the patients condition since the original H and P. 

## 2015-02-23 NOTE — Anesthesia Postprocedure Evaluation (Signed)
  Anesthesia Post-op Note  Patient: Amy Rubio  Procedure(s) Performed: Procedure(s) with comments: CATARACT EXTRACTION PHACO AND INTRAOCULAR LENS PLACEMENT (IOC) (Right) - CDE:10.68  Patient Location: Short Stay  Anesthesia Type:MAC  Level of Consciousness: awake, alert  and oriented  Airway and Oxygen Therapy: Patient Spontanous Breathing  Post-op Pain: none  Post-op Assessment: Post-op Vital signs reviewed, Patient's Cardiovascular Status Stable, Respiratory Function Stable, Patent Airway and No signs of Nausea or vomiting  Post-op Vital Signs: Reviewed  Last Vitals:  Filed Vitals:   02/23/15 1205  BP: 142/53  Pulse:   Temp:   Resp: 17    Complications: No apparent anesthesia complications

## 2015-02-24 ENCOUNTER — Encounter (HOSPITAL_COMMUNITY): Payer: Self-pay | Admitting: Ophthalmology

## 2015-02-24 LAB — TOXASSURE SELECT 13 (MW), URINE: PDF: 0

## 2015-02-24 LAB — PLEASE NOTE

## 2015-02-24 NOTE — Addendum Note (Signed)
Addendum  created 02/24/15 1412 by Mickel Baas, CRNA   Modules edited: Anesthesia Responsible Staff

## 2015-03-03 ENCOUNTER — Other Ambulatory Visit: Payer: Self-pay | Admitting: Family Medicine

## 2015-03-08 ENCOUNTER — Telehealth: Payer: Self-pay | Admitting: Family Medicine

## 2015-03-08 NOTE — Telephone Encounter (Signed)
Unexpected urine result, pt needs to follow up ov and bring pill bottles of pain meds and nerve pills, ov must be this week and must bring pill bottles

## 2015-03-08 NOTE — Telephone Encounter (Signed)
[facility]

## 2015-03-10 NOTE — Telephone Encounter (Signed)
Discussed with pt. Pt advised to bring her nerve pills and pain meds to office visit. Pt transferred to front to schedule for office visit this week

## 2015-03-12 ENCOUNTER — Encounter: Payer: Self-pay | Admitting: Family Medicine

## 2015-03-12 ENCOUNTER — Ambulatory Visit (INDEPENDENT_AMBULATORY_CARE_PROVIDER_SITE_OTHER): Payer: Medicare Other | Admitting: Family Medicine

## 2015-03-12 ENCOUNTER — Telehealth: Payer: Self-pay | Admitting: Family Medicine

## 2015-03-12 VITALS — BP 140/70 | Ht 65.0 in | Wt 118.2 lb

## 2015-03-12 DIAGNOSIS — Z7189 Other specified counseling: Secondary | ICD-10-CM | POA: Diagnosis not present

## 2015-03-12 DIAGNOSIS — G8929 Other chronic pain: Secondary | ICD-10-CM

## 2015-03-12 NOTE — Telephone Encounter (Signed)
Pt's daughter called stating that the pt asked her to "borrow" 41 pills today for her pill count today at the office. Pt's daughter states that the pt picked up her oxycodone prescription from [place] today and kept 36 pills out of the daughters prescription after giving 5 of them back to the daughter after her appointment.

## 2015-03-12 NOTE — Progress Notes (Signed)
   Subjective:    Patient ID: Amy Rubio, female    DOB: 1951/01/04, 64 y.o.   MRN: [id number]  HPI Patient is here to discuss recent urine drug test results. Patient has no other concerns at this time.  Pt states she accidentally took daughters Lorazepam. She staes she corrected that now. She denies drug abuse or addiction Uses nerve meds for anxiety and panic attacks  Review of Systems     Objective:   Physical Exam   pts pain meds were verified and legit Nerve pills verified and legit     Assessment & Plan:  15 min spent with pt Pt understasnds to only use her meds Not to use nerve pills at bedtime due to risk of death She was given 2 rx for pain meds (3rd one kept on file)  We will call her back in July for random UDT and pill count

## 2015-03-12 NOTE — Telephone Encounter (Signed)
This patient has had a difficult relationship with her daughter. It is hard to believe either party. We can only do his best as we can with monitoring. It should be noted that the patient will be having a urine drug screen in July bringing in her medicines or pill count. Sure of using a lie detector tests and a in the home security guard to monitor every patient it is impossible to prevent all issues. So therefore I have chosen not to pursue this above issue further. We will be doing urine drug screen on this patient in July

## 2015-03-29 ENCOUNTER — Other Ambulatory Visit: Payer: Self-pay | Admitting: Family Medicine

## 2015-03-29 NOTE — Telephone Encounter (Signed)
Current study shows that this medication along with the level of pain medicine she is on his dangerous I recommend reducing the number of pills per month to 60 tablets, Xanax 1 mg take a half a tablet every 6 hours when necessary, let the patient know that ultimately we will taper her down to 60 tablets of 0.5 mg. This is in her best interests

## 2015-03-30 ENCOUNTER — Telehealth: Payer: Self-pay | Admitting: Family Medicine

## 2015-03-30 NOTE — Telephone Encounter (Signed)
[facility] to patient about changes Dr.Scott made to Xanax prescription due to the dangers of taking with pain medications on such a high dose. Dr.Scott changed prescription to 1MG  xanax and the directions changed for patient to take 1/2 a tablet every 6 hours as needed. Dr. Nicki Reaper also stated that he wanted to eventually reduce her to 0.5mg  eventually.

## 2015-04-14 ENCOUNTER — Encounter: Payer: Self-pay | Admitting: Family Medicine

## 2015-04-14 ENCOUNTER — Ambulatory Visit (INDEPENDENT_AMBULATORY_CARE_PROVIDER_SITE_OTHER): Payer: Medicare Other | Admitting: Family Medicine

## 2015-04-14 VITALS — BP 118/76 | Ht 65.0 in | Wt 112.1 lb

## 2015-04-14 DIAGNOSIS — Z7189 Other specified counseling: Secondary | ICD-10-CM | POA: Diagnosis not present

## 2015-04-14 DIAGNOSIS — Z79891 Long term (current) use of opiate analgesic: Secondary | ICD-10-CM | POA: Diagnosis not present

## 2015-04-14 DIAGNOSIS — G8929 Other chronic pain: Secondary | ICD-10-CM

## 2015-04-14 MED ORDER — OXYCODONE-ACETAMINOPHEN 10-325 MG PO TABS: 1.0000 | ORAL_TABLET | ORAL | Status: DC | PRN

## 2015-04-14 NOTE — Progress Notes (Signed)
   Subjective:    Patient ID: Amy Rubio, female    DOB: 1951-03-08, 64 y.o.   MRN: [id number]  HPI  Patient in today to repeat urine drug screen. Patient states no other concerns this visit.the reason we are repeating this her last urine drug screen showed lorazepam and she admits that she took one of her daughters medicine by accident she states she is no longer doing that  Review of Systems  Constitutional: Negative for activity change and appetite change.  Gastrointestinal: Negative for vomiting and abdominal pain.  Neurological: Negative for weakness.  Psychiatric/Behavioral: Negative for confusion.       Objective:   Physical Exam  Constitutional: She appears well-nourished. No distress.  HENT:  Head: Normocephalic.  Cardiovascular: Normal rate, regular rhythm and normal heart sounds.   No murmur heard. Pulmonary/Chest: Effort normal and breath sounds normal.  Musculoskeletal: She exhibits no edema.  Lymphadenopathy:    She has no cervical adenopathy.  Neurological: She is alert.  Psychiatric: Her behavior is normal.  Vitals reviewed.         Assessment & Plan:  I did a pill count her pill count for both pain medicine and her medicine does show compliance. She was given a prescription today for her pain medicine she can get her other prescription once urine drug screen comes back looking good. She denies using anybody else's medicine and states she is compliant

## 2015-04-16 ENCOUNTER — Other Ambulatory Visit: Payer: Self-pay | Admitting: Family Medicine

## 2015-04-17 LAB — TOXASSURE SELECT 13 (MW), URINE: PDF: 0

## 2015-04-26 ENCOUNTER — Other Ambulatory Visit: Payer: Self-pay | Admitting: *Deleted

## 2015-04-26 ENCOUNTER — Encounter: Payer: Self-pay | Admitting: *Deleted

## 2015-05-25 ENCOUNTER — Other Ambulatory Visit: Payer: Self-pay | Admitting: Family Medicine

## 2015-05-26 ENCOUNTER — Other Ambulatory Visit: Payer: Self-pay

## 2015-05-26 MED ORDER — ALPRAZOLAM 0.5 MG PO TABS: ORAL_TABLET | ORAL | Status: DC

## 2015-05-26 NOTE — Telephone Encounter (Signed)
May refill gabapentin 6 refills, as for the Xanax please change medication, and new dosage 0.5 mg, 1 TID times a day when necessary, #90 with one refill

## 2015-06-02 ENCOUNTER — Telehealth: Payer: Self-pay | Admitting: Family Medicine

## 2015-06-02 NOTE — Telephone Encounter (Signed)
Pt's neighbor called stating that the pt is constantly coming over her house bugging her for her pain meds. Neighbor stated that the pt just got her meds refilled two weeks ago and is out.

## 2015-06-02 NOTE — Telephone Encounter (Signed)
If this is true (out of meds) concerning, partic with high amnt of meds pt is on, also request for other meds concerning in light of recent interaction here at office re screen results See prior note)  Very difficult to act on "neighbor calls" alone, Scott to see this when he returns

## 2015-06-08 ENCOUNTER — Other Ambulatory Visit: Payer: Self-pay | Admitting: *Deleted

## 2015-06-08 MED ORDER — LISINOPRIL-HYDROCHLOROTHIAZIDE 20-25 MG PO TABS: ORAL_TABLET | ORAL | Status: DC

## 2015-06-09 NOTE — Telephone Encounter (Signed)
Unfortunately we cannot police what goes on outside of our office. This patient has been prescribed her medication by the book. There are has been more than one incidence where her daughter who does not get along with her has called to state various complaints regarding her medications. This patient has gone through additional urine drug screens and pill counts and has been legitimate. We will be repeating another urine drug screen when the patient follows up

## 2015-06-14 ENCOUNTER — Encounter: Payer: Self-pay | Admitting: Family Medicine

## 2015-06-14 ENCOUNTER — Ambulatory Visit (INDEPENDENT_AMBULATORY_CARE_PROVIDER_SITE_OTHER): Payer: Medicare Other | Admitting: Family Medicine

## 2015-06-14 VITALS — BP 122/66 | Ht 65.0 in | Wt 110.0 lb

## 2015-06-14 DIAGNOSIS — D692 Other nonthrombocytopenic purpura: Secondary | ICD-10-CM

## 2015-06-14 DIAGNOSIS — Z7189 Other specified counseling: Secondary | ICD-10-CM | POA: Diagnosis not present

## 2015-06-14 DIAGNOSIS — G8929 Other chronic pain: Secondary | ICD-10-CM

## 2015-06-14 MED ORDER — OXYCODONE-ACETAMINOPHEN 10-325 MG PO TABS: 1.0000 | ORAL_TABLET | ORAL | Status: DC | PRN

## 2015-06-14 MED ORDER — PREDNISONE 20 MG PO TABS: ORAL_TABLET | ORAL | Status: DC
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
## 2015-06-14 NOTE — Patient Instructions (Signed)

## 2015-07-06 DIAGNOSIS — Z961 Presence of intraocular lens: Secondary | ICD-10-CM | POA: Diagnosis not present

## 2015-07-19 ENCOUNTER — Other Ambulatory Visit: Payer: Self-pay | Admitting: Family Medicine

## 2015-07-20 NOTE — Telephone Encounter (Signed)
May have this and 4 refills 

## 2015-07-22 ENCOUNTER — Ambulatory Visit (INDEPENDENT_AMBULATORY_CARE_PROVIDER_SITE_OTHER): Payer: Medicare Other

## 2015-07-22 DIAGNOSIS — Z23 Encounter for immunization: Secondary | ICD-10-CM | POA: Diagnosis not present

## 2015-08-04 ENCOUNTER — Other Ambulatory Visit: Payer: Self-pay | Admitting: Family Medicine

## 2015-08-05 ENCOUNTER — Telehealth: Payer: Self-pay | Admitting: Family Medicine

## 2015-08-05 ENCOUNTER — Other Ambulatory Visit: Payer: Self-pay | Admitting: *Deleted

## 2015-08-05 MED ORDER — PREDNISONE 20 MG PO TABS: ORAL_TABLET | ORAL | Status: DC

## 2015-08-05 NOTE — Telephone Encounter (Signed)
Pt is still having issues with her lungs, was issued prednisone  Back on 9/12 for the same issue. She wants to know if she can  Get another round of it to help her knock this out again. Says her  Daughter came over to see her sick, coughing everywhere an used  Her inhaler without asking. Amy Rubio states she cleaned it with  Alcohol but she still got the cough back. She states she can't afford  To come in nor does she feel like she can get in here.    [place]

## 2015-08-05 NOTE — Telephone Encounter (Signed)
Ok adult pred taper 

## 2015-08-05 NOTE — Telephone Encounter (Signed)
Wheezing, low grade fever off and on, inhaler not helping. Requesting prednisone. States dr Nicki Reaper usually calls it in for her. Cannot come in today. Coughing but not getting anything up. Using mucinex.

## 2015-08-05 NOTE — Telephone Encounter (Signed)
Med sent to pharm. Pt notified.  

## 2015-08-06 ENCOUNTER — Other Ambulatory Visit: Payer: Self-pay | Admitting: *Deleted

## 2015-08-06 ENCOUNTER — Telehealth: Payer: Self-pay | Admitting: Family Medicine

## 2015-08-06 MED ORDER — GABAPENTIN 300 MG PO CAPS: 300.0000 mg | ORAL_CAPSULE | Freq: Three times a day (TID) | ORAL | Status: DC

## 2015-08-06 NOTE — Telephone Encounter (Signed)
Pt called stating the gabapentin is not working and that she is having to take two three times a day for it to work. Pt is wanting a stronger dose called in. Pt got them filled on the 17th and has three left due to taking more than prescribed.    [employer]

## 2015-08-06 NOTE — Telephone Encounter (Signed)
Discussed with pt. Med sent to pharm.  

## 2015-08-06 NOTE — Telephone Encounter (Signed)
Change gabapentin. 300 mg 1, 3 times a day, #93 refills keep standard follow-up follow-up sooner if need be

## 2015-09-07 ENCOUNTER — Other Ambulatory Visit: Payer: Self-pay | Admitting: Family Medicine

## 2015-09-13 ENCOUNTER — Ambulatory Visit (INDEPENDENT_AMBULATORY_CARE_PROVIDER_SITE_OTHER): Payer: Medicare Other | Admitting: Family Medicine

## 2015-09-13 ENCOUNTER — Encounter: Payer: Self-pay | Admitting: Family Medicine

## 2015-09-13 ENCOUNTER — Telehealth: Payer: Self-pay | Admitting: Family Medicine

## 2015-09-13 VITALS — BP 120/70 | Temp 98.2°F | Ht 65.0 in | Wt 112.0 lb

## 2015-09-13 DIAGNOSIS — G894 Chronic pain syndrome: Secondary | ICD-10-CM

## 2015-09-13 DIAGNOSIS — J441 Chronic obstructive pulmonary disease with (acute) exacerbation: Secondary | ICD-10-CM

## 2015-09-13 MED ORDER — OXYCODONE-ACETAMINOPHEN 10-325 MG PO TABS: 1.0000 | ORAL_TABLET | ORAL | Status: DC | PRN

## 2015-09-13 MED ORDER — PREDNISONE 20 MG PO TABS: ORAL_TABLET | ORAL | Status: DC

## 2015-09-13 MED ORDER — DOXYCYCLINE HYCLATE 100 MG PO CAPS: 100.0000 mg | ORAL_CAPSULE | Freq: Two times a day (BID) | ORAL | Status: DC

## 2015-09-13 NOTE — Patient Instructions (Signed)

## 2015-09-13 NOTE — Progress Notes (Signed)
   Subjective:    Patient ID: Amy Rubio, female    DOB: 02-Jun-1951, 64 y.o.   MRN: [id number]  HPI This patient was seen today for chronic pain  The medication list was reviewed and updated.   -Compliance with medication: yes  - Number patient states they take daily: 6 daily   -when was the last dose patient took: this morning  The patient was advised the importance of maintaining medication and not using illegal substances with these.  Refills needed: yes  The patient was educated that we can provide 3 monthly scripts for their medication, it is their responsibility to follow the instructions.  Side effects or complications from medications: none  Patient is aware that pain medications are meant to minimize the severity of the pain to allow their pain levels to improve to allow for better function. They are aware of that pain medications cannot totally remove their pain.  Due for UDT ( at least once per year) : up to date     Patient states that she has cough and congestion. This has been present for several days now. Patient denies high fever chills relate some wheezing cough congestion she does smoke.       Review of Systems  Constitutional: Negative for fever and activity change.  HENT: Positive for congestion and rhinorrhea. Negative for ear pain.   Eyes: Negative for discharge.  Respiratory: Positive for cough. Negative for shortness of breath and wheezing.   Cardiovascular: Negative for chest pain.       Objective:   Physical Exam  Constitutional: She appears well-developed.  HENT:  Head: Normocephalic.  Nose: Nose normal.  Mouth/Throat: Oropharynx is clear and moist. No oropharyngeal exudate.  Neck: Neck supple.  Cardiovascular: Normal rate and normal heart sounds.   No murmur heard. Pulmonary/Chest: Effort normal and breath sounds normal. She has no wheezes.  Lymphadenopathy:    She has no cervical adenopathy.  Skin: Skin is warm and dry.  Nursing  note and vitals reviewed.    Patient will get chest x-ray.     Assessment & Plan:  Chronic COPD COPD exacerbation antibiotics and prednisone prescribed albuterol Chronic pain prescriptions given The patient was seen today as part of a comprehensive visit regarding pain control. Patient's compliance with the medication as well as discussion regarding effectiveness was completed. Prescriptions were written. Patient was advised to follow-up in 3 months. The patient was assessed for any signs of severe side effects. The patient was advised to take the medicine as directed and to report to [place] if any side effect issues.

## 2015-09-13 NOTE — Telephone Encounter (Signed)
error 

## 2015-09-14 ENCOUNTER — Ambulatory Visit (HOSPITAL_COMMUNITY)
Admission: RE | Admit: 2015-09-14 | Discharge: 2015-09-14 | Disposition: A | Discharge status: Home / Self Care | Payer: Medicare Other | Source: Ambulatory Visit | Attending: Family Medicine | Admitting: Family Medicine

## 2015-09-14 DIAGNOSIS — J441 Chronic obstructive pulmonary disease with (acute) exacerbation: Secondary | ICD-10-CM | POA: Insufficient documentation

## 2015-09-14 DIAGNOSIS — Z720 Tobacco use: Secondary | ICD-10-CM | POA: Diagnosis not present

## 2015-09-14 DIAGNOSIS — R05 Cough: Secondary | ICD-10-CM | POA: Diagnosis not present

## 2015-10-06 ENCOUNTER — Other Ambulatory Visit: Payer: Self-pay | Admitting: Family Medicine

## 2015-10-06 ENCOUNTER — Telehealth: Payer: Self-pay | Admitting: Family Medicine

## 2015-10-06 MED ORDER — PREDNISONE 20 MG PO TABS: ORAL_TABLET | ORAL | Status: DC

## 2015-10-06 MED ORDER — AMOXICILLIN-POT CLAVULANATE 875-125 MG PO TABS: 1.0000 | ORAL_TABLET | Freq: Two times a day (BID) | ORAL | Status: AC

## 2015-10-06 NOTE — Telephone Encounter (Signed)
Patient called to check on this.  I explained to her that the message was answered at lunch time and the nurse has not had a chance to call her yet.  She wants to know if we can call her soon if/when we call in anything for her.

## 2015-10-06 NOTE — Telephone Encounter (Signed)
I would recommend Augmentin 875 one twice a day 10 days with food, prednisone taper, #18, 20 mg,-if fevers difficulty breathing or worse follow-up immediately3qd for 3d then 2qd for 3d then 1qd for 3d

## 2015-10-06 NOTE — Telephone Encounter (Signed)
Meds sent to pharmacy. Patient was notified.  

## 2015-10-06 NOTE — Telephone Encounter (Signed)
Pt is requesting prednisone to be called in. Pt states that she is still not better.   [employer]

## 2015-10-06 NOTE — Telephone Encounter (Signed)
Patient was seen on 09/13/15 for COPD exacerbation. Patient states that she is still having cough, congestion, sob and wheezing. No fever noted. Patient has completed antibiotics.

## 2015-10-28 ENCOUNTER — Other Ambulatory Visit: Payer: Self-pay | Admitting: Family Medicine

## 2015-11-08 ENCOUNTER — Other Ambulatory Visit: Payer: Self-pay | Admitting: Family Medicine

## 2015-11-08 NOTE — Telephone Encounter (Signed)
May have this and 3 refills 

## 2015-11-24 ENCOUNTER — Other Ambulatory Visit: Payer: Self-pay | Admitting: Family Medicine

## 2015-12-13 ENCOUNTER — Encounter: Payer: Self-pay | Admitting: Family Medicine

## 2015-12-13 ENCOUNTER — Telehealth: Payer: Self-pay | Admitting: Family Medicine

## 2015-12-13 ENCOUNTER — Ambulatory Visit (INDEPENDENT_AMBULATORY_CARE_PROVIDER_SITE_OTHER): Payer: Medicare Other | Admitting: Family Medicine

## 2015-12-13 VITALS — BP 126/58 | Ht 65.0 in | Wt 108.2 lb

## 2015-12-13 DIAGNOSIS — H5340 Unspecified visual field defects: Secondary | ICD-10-CM | POA: Insufficient documentation

## 2015-12-13 DIAGNOSIS — Z1211 Encounter for screening for malignant neoplasm of colon: Secondary | ICD-10-CM | POA: Diagnosis not present

## 2015-12-13 DIAGNOSIS — E785 Hyperlipidemia, unspecified: Secondary | ICD-10-CM | POA: Diagnosis not present

## 2015-12-13 DIAGNOSIS — J438 Other emphysema: Secondary | ICD-10-CM

## 2015-12-13 DIAGNOSIS — F17200 Nicotine dependence, unspecified, uncomplicated: Secondary | ICD-10-CM | POA: Diagnosis not present

## 2015-12-13 DIAGNOSIS — I10 Essential (primary) hypertension: Secondary | ICD-10-CM

## 2015-12-13 DIAGNOSIS — J441 Chronic obstructive pulmonary disease with (acute) exacerbation: Secondary | ICD-10-CM | POA: Insufficient documentation

## 2015-12-13 DIAGNOSIS — G894 Chronic pain syndrome: Secondary | ICD-10-CM | POA: Diagnosis not present

## 2015-12-13 DIAGNOSIS — Z23 Encounter for immunization: Secondary | ICD-10-CM | POA: Diagnosis not present

## 2015-12-13 DIAGNOSIS — M810 Age-related osteoporosis without current pathological fracture: Secondary | ICD-10-CM

## 2015-12-13 DIAGNOSIS — Z139 Encounter for screening, unspecified: Secondary | ICD-10-CM | POA: Diagnosis not present

## 2015-12-13 MED ORDER — OXYCODONE-ACETAMINOPHEN 10-325 MG PO TABS: 1.0000 | ORAL_TABLET | ORAL | Status: DC | PRN

## 2015-12-13 MED ORDER — LISINOPRIL-HYDROCHLOROTHIAZIDE 20-12.5 MG PO TABS: 1.0000 | ORAL_TABLET | Freq: Every day | ORAL | Status: DC

## 2015-12-13 NOTE — Telephone Encounter (Signed)
Patient has an appointment at 1:00 today to see Dr. Nicki Reaper for her regular OV.  She wants to know if we can go ahead and get her an Rx for the oxycodone ready to pick up this morning ASAP because she is completely out and has nothing until she gets to her appointment today.

## 2015-12-13 NOTE — Patient Instructions (Signed)
Do not use Xanax at bedtime

## 2015-12-13 NOTE — Progress Notes (Signed)
   Subjective:    Patient ID: Amy Rubio, female    DOB: 06-19-1951, 65 y.o.   MRN: [id number]  HPI This patient was seen today for chronic pain  The medication list was reviewed and updated.   -Compliance with medication: Takes, as prescribed  - Number patient states they take daily: Patient states takes 6 tablets.  -when was the last dose patient took? Last dose taken at 0500 The patient was advised the importance of maintaining medication and not using illegal substances with these.  Refills needed: Yes  The patient was educated that we can provide 3 monthly scripts for their medication, it is their responsibility to follow the instructions.  Side effects or complications from medications: None  Patient is aware that pain medications are meant to minimize the severity of the pain to allow their pain levels to improve to allow for better function. They are aware of that pain medications cannot totally remove their pain.  Due for UDT ( at least once per year) : 04/14/2015  Patient in today with c/o black webs in visual field. Onset yesterday evening.   patient relates blood  Pressure medication seems to be going okay but at times she gets lightheaded when she first stands up sometimes feels weak She also relates history hyperlipidemia she does need to get her lipid profile rechecked She does have the chronic pain which medication seems to help she denies abusing it She has not had a colonoscopy she understands the need to have this done She also relates that she has intermittent black lines in her visual fields she is under the care of Dr. Michel Santee we will refer to them Patient does have chronic anxiety but denies being depressed.     Review of Systems     Denies high fever chills sweats nausea vomiting diarrhea denies chest pressure tightness pain rectal bleeding. Objective:   Physical Exam  weight loss 4 pounds, doesn't eat as well as she should. Lungs clear heart regular  abdomen soft extremities no edema subjective low back pain and hand arthralgias.  The patient was seen after hours to prevent an emergency department visit      Assessment & Plan:   HTN-reduce blood pressure medication new dose lisinopril 20/12.5 follow-up again in 3 months Chronic pain low back and in the hands. Pain prescriptions refilled 3 scripts given Chronic anxiety Xanax maybe use during the day not for evening use patient denies causing drowsiness or abuse  COPD stable patient encouraged quit smoking  low-dose CT scan recommended-shared decision making was counseled to the patient. She was counseled to quit smoking. She was also informed that CT scans can pick up early cancers. She is aware of the possibility of a false positive. She is also aware that she may end up having to have biopsy other procedures or potentially surgery she is okay with that. She is also aware that it could show cancer. She is aware that doing low-dose CT scan yearly can reduce her risk of dying from lung cancer by 20%.

## 2015-12-13 NOTE — Telephone Encounter (Signed)
Patient will need to wait till her office visit

## 2015-12-15 ENCOUNTER — Telehealth: Payer: Self-pay | Admitting: Family Medicine

## 2015-12-15 NOTE — Telephone Encounter (Signed)
Patient wants to know if she can be prescribed Lyrica.  She said that people she knows has tried it and liked it and she just wants to try it?   [employer]

## 2015-12-15 NOTE — Telephone Encounter (Signed)
Lyrica is a metabolite of Neurontin. The patient is already on Neurontin-gabapentin. I'm not sure that Lyrica would really offer any additional benefit. Also Lyrica tends to be much more expensive and many insurance plans do not cover it. If patient is insistent with trying Lyrica we would have to stop the gabapentin then once the prescription is sent in then we will find out if her insurance covers it or not. No guarantees

## 2015-12-16 MED ORDER — PREGABALIN 50 MG PO CAPS: 50.0000 mg | ORAL_CAPSULE | Freq: Two times a day (BID) | ORAL | Status: DC

## 2015-12-16 NOTE — Telephone Encounter (Signed)
Sent to pharmacy 

## 2015-12-16 NOTE — Telephone Encounter (Signed)
Lyrica 50 mg, 1 twice a day, #60, 3 refills, stop gabapentin once starting Lyrica

## 2015-12-16 NOTE — Telephone Encounter (Signed)
Notified patient Lyrica is a metabolite of Neurontin. The patient is already on Neurontin-gabapentin. Dr. Nicki Reaper is not sure that Lyrica would really offer any additional benefit. Also Lyrica tends to be much more expensive and many insurance plans do not cover it. If patient is insistent with trying Lyrica we would have to stop the gabapentin then once the prescription is sent in then we will find out if her insurance covers it or not. No guarantees. Patient states that she still wants to try the Lyrica. Please send to [facility]. Patient knows she has to stop the gabapentin if Lyrica is approved.

## 2016-01-03 ENCOUNTER — Other Ambulatory Visit: Payer: Self-pay | Admitting: *Deleted

## 2016-01-03 ENCOUNTER — Telehealth: Payer: Self-pay | Admitting: Family Medicine

## 2016-01-03 MED ORDER — PREDNISONE 20 MG PO TABS: ORAL_TABLET | ORAL | Status: DC

## 2016-01-03 NOTE — Telephone Encounter (Signed)
[facility] 01/03/16

## 2016-01-03 NOTE — Telephone Encounter (Signed)
Spoke with patient to discuss symptoms. Patient states that she has productive cough with clear sputum, wheezing and chest congestion. Patient denies fever or difficulty breathing. Please advise?

## 2016-01-03 NOTE — Telephone Encounter (Signed)
Prednisone, 20 mg, #18,3qd for 3d then 2qd for 3d then 1qd for 3d If developing any fever mucoid drainage or other problems immediately needs office visit or ER

## 2016-01-03 NOTE — Telephone Encounter (Signed)
Patient is requesting Rx for prednisone for chest congestion.  She says sometimes Dr. Nicki Reaper does this without her being seen.   [employer]

## 2016-01-03 NOTE — Telephone Encounter (Signed)
Discussed with pt. Med sent to pharm.  

## 2016-01-04 DIAGNOSIS — H43811 Vitreous degeneration, right eye: Secondary | ICD-10-CM | POA: Diagnosis not present

## 2016-01-07 ENCOUNTER — Telehealth: Payer: Self-pay | Admitting: Family Medicine

## 2016-01-07 MED ORDER — PREGABALIN 100 MG PO CAPS: 100.0000 mg | ORAL_CAPSULE | Freq: Two times a day (BID) | ORAL | Status: DC

## 2016-01-07 NOTE — Telephone Encounter (Signed)
Next step is 100 mg 1 twice a day, #60, 4 refills, she will need to stay on this dose until she follows up with me at her next visit

## 2016-01-07 NOTE — Telephone Encounter (Signed)
Pt called stating that the lyrica is somewhat working, but she wants to know if it can be increased to the 150 mg. Pt states that she has a friend who is on the 150 mg and she tried one of hers and it worked very well.

## 2016-01-07 NOTE — Telephone Encounter (Signed)
Spoke with patient and informed her per Dr.Scott Luking-Next step is 100 mg 1 twice a day, #60, 4 refills, will need to stay on this dose until follow up with Dr.Scott  at her next visit. Patient verbalized understanding.

## 2016-01-24 ENCOUNTER — Other Ambulatory Visit: Payer: Self-pay | Admitting: Family Medicine

## 2016-01-27 ENCOUNTER — Telehealth: Payer: Self-pay | Admitting: Family Medicine

## 2016-01-27 MED ORDER — PREGABALIN 100 MG PO CAPS: 100.0000 mg | ORAL_CAPSULE | Freq: Two times a day (BID) | ORAL | Status: DC

## 2016-01-27 NOTE — Telephone Encounter (Signed)
May rf early ( lyrica) cant say if insur will pay though
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
## 2016-01-27 NOTE — Telephone Encounter (Signed)
Patient notified. Script faxed to pharmacy.

## 2016-03-01 ENCOUNTER — Ambulatory Visit (INDEPENDENT_AMBULATORY_CARE_PROVIDER_SITE_OTHER): Payer: Medicare Other | Admitting: Family Medicine

## 2016-03-01 ENCOUNTER — Encounter: Payer: Self-pay | Admitting: Family Medicine

## 2016-03-01 VITALS — BP 140/80 | Temp 99.0°F | Ht 65.0 in | Wt 107.1 lb

## 2016-03-01 DIAGNOSIS — J441 Chronic obstructive pulmonary disease with (acute) exacerbation: Secondary | ICD-10-CM | POA: Diagnosis not present

## 2016-03-01 DIAGNOSIS — J438 Other emphysema: Secondary | ICD-10-CM

## 2016-03-01 DIAGNOSIS — G894 Chronic pain syndrome: Secondary | ICD-10-CM

## 2016-03-01 MED ORDER — PREDNISONE 20 MG PO TABS: ORAL_TABLET | ORAL | Status: DC

## 2016-03-01 MED ORDER — HYDROCODONE-ACETAMINOPHEN 10-325 MG PO TABS: 1.0000 | ORAL_TABLET | Freq: Four times a day (QID) | ORAL | Status: DC | PRN

## 2016-03-01 MED ORDER — CEFPROZIL 500 MG PO TABS: 500.0000 mg | ORAL_TABLET | Freq: Two times a day (BID) | ORAL | Status: DC

## 2016-03-01 MED ORDER — CEFTRIAXONE SODIUM 1 G IJ SOLR
500.0000 mg | Freq: Once | INTRAMUSCULAR | Status: AC
  Administered 2016-03-01: 500 mg via INTRAMUSCULAR
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
## 2016-03-02 ENCOUNTER — Ambulatory Visit (HOSPITAL_COMMUNITY)
Admission: RE | Admit: 2016-03-02 | Discharge: 2016-03-02 | Disposition: A | Discharge status: Home / Self Care | Payer: Medicare Other | Source: Ambulatory Visit | Attending: Family Medicine | Admitting: Family Medicine

## 2016-03-02 DIAGNOSIS — J449 Chronic obstructive pulmonary disease, unspecified: Secondary | ICD-10-CM | POA: Diagnosis not present

## 2016-03-02 DIAGNOSIS — J441 Chronic obstructive pulmonary disease with (acute) exacerbation: Secondary | ICD-10-CM | POA: Insufficient documentation

## 2016-03-10 ENCOUNTER — Encounter: Payer: Self-pay | Admitting: Family Medicine

## 2016-03-10 ENCOUNTER — Ambulatory Visit (INDEPENDENT_AMBULATORY_CARE_PROVIDER_SITE_OTHER): Payer: Medicare Other | Admitting: Family Medicine

## 2016-03-10 VITALS — BP 134/84 | Ht 65.0 in | Wt 107.0 lb

## 2016-03-10 DIAGNOSIS — J441 Chronic obstructive pulmonary disease with (acute) exacerbation: Secondary | ICD-10-CM

## 2016-03-10 DIAGNOSIS — G894 Chronic pain syndrome: Secondary | ICD-10-CM

## 2016-03-10 MED ORDER — LEVOFLOXACIN 500 MG PO TABS: 500.0000 mg | ORAL_TABLET | Freq: Every day | ORAL | Status: DC

## 2016-03-10 MED ORDER — PREGABALIN 150 MG PO CAPS: 150.0000 mg | ORAL_CAPSULE | Freq: Two times a day (BID) | ORAL | Status: DC

## 2016-03-10 MED ORDER — OXYCODONE-ACETAMINOPHEN 10-325 MG PO TABS: 1.0000 | ORAL_TABLET | ORAL | Status: DC | PRN

## 2016-03-10 MED ORDER — PREDNISONE 20 MG PO TABS: ORAL_TABLET | ORAL | Status: DC

## 2016-03-10 NOTE — Progress Notes (Signed)
   Subjective:    Patient ID: Amy Rubio, female    DOB: December 22, 1950, 65 y.o.   MRN: [id number]  HPI This patient was seen today for chronic pain  The medication list was reviewed and updated.   -Compliance with medication: Yes - Number patient states they take daily: 6 per day   -when was the last dose patient took? Last hydrocodone this afternoon. The patient was advised the importance of maintaining medication and not using illegal substances with these.  Refills needed: Yes  The patient was educated that we can provide 3 monthly scripts for their medication, it is their responsibility to follow the instructions.  Side effects or complications from medications: None  Patient is aware that pain medications are meant to minimize the severity of the pain to allow their pain levels to improve to allow for better function. They are aware of that pain medications cannot totally remove their pain.  Due for UDT ( at least once per year) : Yes, due today.   Patient has concerns of cough, and wheezing.   Patient with moderate COPD exacerbation medications prescribed warning signs discuss no need for x-rays or hospitalization at this point O2 saturation 98% patient counseled to quit smoking  Review of Systems  Constitutional: Negative for activity change and appetite change.  Gastrointestinal: Negative for vomiting and abdominal pain.  Neurological: Negative for weakness.  Psychiatric/Behavioral: Negative for confusion.       Objective:   Physical Exam  Constitutional: She appears well-nourished. No distress.  HENT:  Head: Normocephalic.  Cardiovascular: Normal rate, regular rhythm and normal heart sounds.   No murmur heard. Pulmonary/Chest: Effort normal and breath sounds normal.  Musculoskeletal: She exhibits no edema.  Lymphadenopathy:    She has no cervical adenopathy.  Neurological: She is alert.  Psychiatric: Her behavior is normal.  Vitals reviewed.           Assessment & Plan:  Chronic pain 1 prescription given patient will follow-up in 2-3 weeks for a pill count and urine drug screening. She did do her pain contract today.

## 2016-03-23 ENCOUNTER — Telehealth: Payer: Self-pay | Admitting: Family Medicine

## 2016-03-23 MED ORDER — ALPRAZOLAM 0.5 MG PO TABS: ORAL_TABLET | ORAL | Status: DC

## 2016-03-23 NOTE — Telephone Encounter (Signed)
Received RX request for patient's Xanax. May we refill?

## 2016-03-23 NOTE — Telephone Encounter (Signed)
May refill times one 

## 2016-03-23 NOTE — Addendum Note (Signed)
Addended by: Launa Grill on: 03/23/2016 09:49 AM   Modules accepted: Orders

## 2016-03-23 NOTE — Telephone Encounter (Signed)
Prescription printed to be faxed to [facility].

## 2016-03-30 ENCOUNTER — Telehealth: Payer: Self-pay | Admitting: Family Medicine
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
## 2016-03-30 NOTE — Telephone Encounter (Signed)
This poses a very difficult dilemma. This is not something that can be handled on a regular basis by our office. Tell the patient to keep her appointment for this

## 2016-03-31 ENCOUNTER — Ambulatory Visit (INDEPENDENT_AMBULATORY_CARE_PROVIDER_SITE_OTHER): Payer: Medicare Other | Admitting: Family Medicine

## 2016-03-31 ENCOUNTER — Encounter: Payer: Self-pay | Admitting: Family Medicine

## 2016-03-31 VITALS — BP 140/82 | Ht 66.0 in | Wt 110.4 lb

## 2016-03-31 DIAGNOSIS — Z7189 Other specified counseling: Secondary | ICD-10-CM

## 2016-03-31 DIAGNOSIS — G8929 Other chronic pain: Secondary | ICD-10-CM

## 2016-03-31 MED ORDER — PREDNISONE 20 MG PO TABS: ORAL_TABLET | ORAL | Status: DC

## 2016-03-31 MED ORDER — OXYCODONE-ACETAMINOPHEN 10-325 MG PO TABS: ORAL_TABLET | ORAL | Status: DC
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
## 2016-04-06 ENCOUNTER — Telehealth: Payer: Self-pay

## 2016-04-06 NOTE — Telephone Encounter (Signed)
-----   Message from Kathyrn Drown, MD sent at 03/31/2016  5:51 PM EDT ----- This patient will need pill count random urine drug screen the week of July 17 preferably on Tuesday or Wednesday

## 2016-04-06 NOTE — Telephone Encounter (Signed)
Clarified with Dr. Nicki Reaper. Call this patient on July 18th or 19th have her to come in for a pill count. Do not call ahead of time.

## 2016-04-14 ENCOUNTER — Other Ambulatory Visit: Payer: Self-pay | Admitting: Family Medicine

## 2016-04-18 ENCOUNTER — Ambulatory Visit (INDEPENDENT_AMBULATORY_CARE_PROVIDER_SITE_OTHER): Payer: Medicare Other | Admitting: Family Medicine

## 2016-04-18 ENCOUNTER — Encounter: Payer: Self-pay | Admitting: Family Medicine

## 2016-04-18 VITALS — Ht 66.0 in | Wt 110.0 lb

## 2016-04-18 DIAGNOSIS — F17200 Nicotine dependence, unspecified, uncomplicated: Secondary | ICD-10-CM

## 2016-04-18 DIAGNOSIS — R059 Cough, unspecified: Secondary | ICD-10-CM

## 2016-04-18 DIAGNOSIS — M25579 Pain in unspecified ankle and joints of unspecified foot: Secondary | ICD-10-CM

## 2016-04-18 DIAGNOSIS — Z720 Tobacco use: Secondary | ICD-10-CM

## 2016-04-18 DIAGNOSIS — Z79891 Long term (current) use of opiate analgesic: Secondary | ICD-10-CM | POA: Diagnosis not present

## 2016-04-18 DIAGNOSIS — R05 Cough: Secondary | ICD-10-CM | POA: Diagnosis not present

## 2016-04-18 DIAGNOSIS — E042 Nontoxic multinodular goiter: Secondary | ICD-10-CM

## 2016-04-18 MED ORDER — OXYCODONE-ACETAMINOPHEN 10-325 MG PO TABS: ORAL_TABLET | ORAL | Status: DC
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
## 2016-04-18 NOTE — Telephone Encounter (Signed)
Called patient and told her to be here today for her pill count at 3 pm. Patient agreed.

## 2016-04-19 ENCOUNTER — Telehealth: Payer: Self-pay | Admitting: Family Medicine

## 2016-04-19 LAB — CBC WITH DIFFERENTIAL/PLATELET
BASOS: 0 %
Basophils Absolute: 0 10*3/uL (ref 0.0–0.2)
EOS (ABSOLUTE): 0.1 10*3/uL (ref 0.0–0.4)
EOS: 1 %
HEMATOCRIT: 37.8 % (ref 34.0–46.6)
HEMOGLOBIN: 12.7 g/dL (ref 11.1–15.9)
IMMATURE GRANS (ABS): 0 10*3/uL (ref 0.0–0.1)
IMMATURE GRANULOCYTES: 0 %
LYMPHS: 25 %
Lymphocytes Absolute: 1.6 10*3/uL (ref 0.7–3.1)
MCH: 32.6 pg (ref 26.6–33.0)
MCHC: 33.6 g/dL (ref 31.5–35.7)
MCV: 97 fL (ref 79–97)
MONOCYTES: 8 %
Monocytes Absolute: 0.5 10*3/uL (ref 0.1–0.9)
NEUTROS ABS: 4 10*3/uL (ref 1.4–7.0)
NEUTROS PCT: 66 %
PLATELETS: 275 10*3/uL (ref 150–379)
RBC: 3.9 x10E6/uL (ref 3.77–5.28)
RDW: 15.5 % — ABNORMAL HIGH (ref 12.3–15.4)
WBC: 6.2 10*3/uL (ref 3.4–10.8)

## 2016-04-19 LAB — URIC ACID: Uric Acid: 2.8 mg/dL (ref 2.5–7.1)

## 2016-04-19 LAB — SEDIMENTATION RATE: SED RATE: 6 mm/h (ref 0–40)

## 2016-04-19 MED ORDER — ALPRAZOLAM 0.5 MG PO TABS: ORAL_TABLET | ORAL | Status: DC

## 2016-04-19 NOTE — Addendum Note (Signed)
Addended by: Launa Grill on: 04/19/2016 03:43 PM   Modules accepted: Orders

## 2016-04-19 NOTE — Telephone Encounter (Signed)
Prescription printed awaiting signature to be faxed to pharmacy.

## 2016-04-19 NOTE — Telephone Encounter (Signed)
She may have one refill

## 2016-04-19 NOTE — Telephone Encounter (Signed)
Received Rx request for patient's Xanax

## 2016-04-27 ENCOUNTER — Encounter: Payer: Self-pay | Admitting: Family Medicine

## 2016-04-27 LAB — TOXASSURE SELECT 13 (MW), URINE: PDF: 0

## 2016-04-28 ENCOUNTER — Other Ambulatory Visit: Payer: Self-pay

## 2016-04-28 ENCOUNTER — Ambulatory Visit (HOSPITAL_COMMUNITY)
Admission: RE | Admit: 2016-04-28 | Discharge: 2016-04-28 | Disposition: A | Discharge status: Home / Self Care | Payer: Medicare Other | Source: Ambulatory Visit | Attending: Family Medicine | Admitting: Family Medicine

## 2016-04-28 DIAGNOSIS — Z720 Tobacco use: Secondary | ICD-10-CM | POA: Diagnosis not present

## 2016-04-28 DIAGNOSIS — E048 Other specified nontoxic goiter: Secondary | ICD-10-CM | POA: Diagnosis not present

## 2016-04-28 DIAGNOSIS — I2510 Atherosclerotic heart disease of native coronary artery without angina pectoris: Secondary | ICD-10-CM | POA: Insufficient documentation

## 2016-04-28 DIAGNOSIS — J439 Emphysema, unspecified: Secondary | ICD-10-CM | POA: Insufficient documentation

## 2016-04-28 DIAGNOSIS — R05 Cough: Secondary | ICD-10-CM | POA: Diagnosis not present

## 2016-04-28 DIAGNOSIS — M25579 Pain in unspecified ankle and joints of unspecified foot: Secondary | ICD-10-CM | POA: Insufficient documentation

## 2016-04-28 DIAGNOSIS — J449 Chronic obstructive pulmonary disease, unspecified: Secondary | ICD-10-CM | POA: Diagnosis not present

## 2016-04-28 MED ORDER — ALPRAZOLAM 0.5 MG PO TABS: ORAL_TABLET | ORAL | 4 refills | Status: DC

## 2016-05-02 NOTE — Addendum Note (Signed)
Addended by: Dairl Ponder on: 05/02/2016 09:44 AM   Modules accepted: Orders

## 2016-05-08 ENCOUNTER — Ambulatory Visit (HOSPITAL_COMMUNITY)
Admission: RE | Admit: 2016-05-08 | Discharge: 2016-05-08 | Disposition: A | Discharge status: Home / Self Care | Payer: Medicare Other | Source: Ambulatory Visit | Attending: Family Medicine | Admitting: Family Medicine

## 2016-05-08 DIAGNOSIS — E042 Nontoxic multinodular goiter: Secondary | ICD-10-CM | POA: Diagnosis not present

## 2016-05-18 ENCOUNTER — Encounter: Payer: Self-pay | Admitting: Family Medicine

## 2016-05-18 ENCOUNTER — Ambulatory Visit (INDEPENDENT_AMBULATORY_CARE_PROVIDER_SITE_OTHER): Payer: Medicare Other | Admitting: Family Medicine

## 2016-05-18 VITALS — BP 114/70 | Ht 65.0 in | Wt 104.0 lb

## 2016-05-18 DIAGNOSIS — J441 Chronic obstructive pulmonary disease with (acute) exacerbation: Secondary | ICD-10-CM

## 2016-05-18 DIAGNOSIS — Z7189 Other specified counseling: Secondary | ICD-10-CM

## 2016-05-18 DIAGNOSIS — G8929 Other chronic pain: Secondary | ICD-10-CM

## 2016-05-18 DIAGNOSIS — M810 Age-related osteoporosis without current pathological fracture: Secondary | ICD-10-CM

## 2016-05-18 DIAGNOSIS — I10 Essential (primary) hypertension: Secondary | ICD-10-CM | POA: Diagnosis not present

## 2016-05-18 MED ORDER — OXYCODONE-ACETAMINOPHEN 10-325 MG PO TABS: ORAL_TABLET | ORAL | 0 refills | Status: DC

## 2016-05-18 MED ORDER — BUDESONIDE-FORMOTEROL FUMARATE 160-4.5 MCG/ACT IN AERO: 2.0000 | INHALATION_SPRAY | Freq: Two times a day (BID) | RESPIRATORY_TRACT | 3 refills | Status: DC

## 2016-05-18 NOTE — Progress Notes (Signed)
   Subjective:    Patient ID: Amy Rubio, female    DOB: 1950/12/31, 65 y.o.   MRN: [id number]  HPI This patient was seen today for chronic pain  The medication list was reviewed and updated.   -Compliance with medication: takes as prescribed  - Number patient states they take daily: 5 per day  -when was the last dose patient took? yesterday  The patient was advised the importance of maintaining medication and not using illegal substances with these.  Refills needed: yes  The patient was educated that we can provide 3 monthly scripts for their medication, it is their responsibility to follow the instructions.  Side effects or complications from medications: none  Patient is aware that pain medications are meant to minimize the severity of the pain to allow their pain levels to improve to allow for better function. They are aware of that pain medications cannot totally remove their pain.  UDT done at last visit in July.   Needs refill on xanax and oxycodone.  Pain in both arms. lyrica not helping. Has pinch nerve.         Review of Systems  Constitutional: Negative for activity change and appetite change.  Gastrointestinal: Negative for abdominal pain and vomiting.  Musculoskeletal: Positive for arthralgias and back pain.  Neurological: Negative for weakness.  Psychiatric/Behavioral: Negative for confusion.       Objective:   Physical Exam  Constitutional: She appears well-nourished. No distress.  HENT:  Head: Normocephalic.  Cardiovascular: Normal rate, regular rhythm and normal heart sounds.   No murmur heard. Pulmonary/Chest: Effort normal and breath sounds normal.  Musculoskeletal: She exhibits no edema.  Lymphadenopathy:    She has no cervical adenopathy.  Neurological: She is alert.  Psychiatric: Her behavior is normal.  Vitals reviewed.   Patient with significant osteoarthritis in her hands and knees. Also has chronic low back pain.  The patient is  aware that if medications causing drowsiness she is not to drive or operate any dangerous equipment    Assessment & Plan:  I talked with patient at length about the safety of medications. I talked with her about the importance of not being on a nerve medication and the pain medicine because of the increased risk of accidental overdose. At this point she agrees to work hard at tapering her Xanax. We will gradually keep reducing the dose of the Xanax down and hopefully have her off the Xanax within the next few months. Continue pain medication as is. Follow-up 3 months.  Significant COPD issues recent CT scan looks good at [facility] hopefully this will help her with her breathing. Continue Spiriva.  Patient with osteoporosis-bone density ordered  Hypertension stable continue current therapy  Patient counseled to quit smoking  25 minutes was spent with the patient. Greater than half the time was spent in discussion and answering questions and counseling regarding the issues that the patient came in for today.

## 2016-05-19 ENCOUNTER — Other Ambulatory Visit: Payer: Self-pay | Admitting: Family Medicine

## 2016-05-31 ENCOUNTER — Other Ambulatory Visit: Payer: Self-pay | Admitting: Family Medicine

## 2016-05-31 NOTE — Telephone Encounter (Signed)
May have this +4 refills 

## 2016-06-01 ENCOUNTER — Ambulatory Visit (HOSPITAL_COMMUNITY)
Admission: RE | Admit: 2016-06-01 | Discharge: 2016-06-01 | Disposition: A | Discharge status: Home / Self Care | Payer: Medicare Other | Source: Ambulatory Visit | Attending: Family Medicine | Admitting: Family Medicine

## 2016-06-01 DIAGNOSIS — M818 Other osteoporosis without current pathological fracture: Secondary | ICD-10-CM | POA: Diagnosis not present

## 2016-06-01 DIAGNOSIS — M810 Age-related osteoporosis without current pathological fracture: Secondary | ICD-10-CM | POA: Diagnosis not present

## 2016-06-09 ENCOUNTER — Ambulatory Visit: Payer: Medicare Other | Admitting: Family Medicine

## 2016-06-14 ENCOUNTER — Encounter: Payer: Self-pay | Admitting: Family Medicine

## 2016-07-04 ENCOUNTER — Other Ambulatory Visit: Payer: Self-pay | Admitting: Family Medicine

## 2016-07-17 ENCOUNTER — Telehealth: Payer: Self-pay | Admitting: Family Medicine

## 2016-07-17 MED ORDER — PREDNISONE 20 MG PO TABS: ORAL_TABLET | ORAL | 0 refills | Status: DC

## 2016-07-17 MED ORDER — BENZONATATE 100 MG PO CAPS: ORAL_CAPSULE | ORAL | 0 refills | Status: DC

## 2016-07-17 NOTE — Telephone Encounter (Signed)
Pt called to request another round of Prednisone and Cough Syrup. She has an appointment for next month but would like to have something before then.

## 2016-07-17 NOTE — Telephone Encounter (Signed)
Spoke with patient and informed her per Dr.Scott Luking- Patient is already on pain medicine so therefore prescription cough medicine has 1 of 2 choices choice 1-Tessalon 100 mg 1 3 times a day when necessary cough choice to over-the-counter Delsym or Robitussin-DM. Please find out from the patient are we dealing with any fever shortness of breath or other such symptoms. This patient is at high risk for pneumonia. We will be happy to work her in to be seen if she is thinking her COPD is having a flareup. Patient verbalized understanding and stated that she believes it is a flare up of her COPD. She is willing to try the tessalon pearls but she would like to know if she could get prednisone as well. Please advise?

## 2016-07-17 NOTE — Telephone Encounter (Signed)
Left message return call (medication sent into pharmacy) 07/17/16

## 2016-07-17 NOTE — Addendum Note (Signed)
Addended by: Ofilia Neas R on: 07/17/2016 03:03 PM   Modules accepted: Orders

## 2016-07-17 NOTE — Telephone Encounter (Signed)
Patient is already on pain medicine so therefore prescription cough medicine has 1 of 2 choices choice 1-Tessalon 100 mg 1 3 times a day when necessary cough choice to over-the-counter Delsym or Robitussin-DM. Please find out from the patient are we dealing with any fever shortness of breath or other such symptoms. This patient is at high risk for pneumonia. We will be happy to work her in to be seen if she is thinking her COPD is having a flareup.

## 2016-07-17 NOTE — Telephone Encounter (Signed)
Tessalon 100 mg 1 3 times a day when necessary cough, #20, 1 refill. Prednisone 20 mg, #18,3qd for 3d then 2qd for 3d then 1qd for 3d Follow-up if fevers increased shortness breath or worse

## 2016-07-17 NOTE — Telephone Encounter (Signed)
Spoke with patient and informed her per Dr.Scott Luking- we sent over prescription for Tessalon, and prednisone. Patient verbalized understanding.

## 2016-07-31 ENCOUNTER — Other Ambulatory Visit: Payer: Self-pay | Admitting: Family Medicine

## 2016-08-18 ENCOUNTER — Encounter: Payer: Self-pay | Admitting: Family Medicine

## 2016-08-18 ENCOUNTER — Ambulatory Visit (INDEPENDENT_AMBULATORY_CARE_PROVIDER_SITE_OTHER): Payer: Medicare Other | Admitting: Family Medicine

## 2016-08-18 VITALS — BP 124/68 | Ht 65.0 in | Wt 108.0 lb

## 2016-08-18 DIAGNOSIS — G8929 Other chronic pain: Secondary | ICD-10-CM | POA: Diagnosis not present

## 2016-08-18 DIAGNOSIS — F17200 Nicotine dependence, unspecified, uncomplicated: Secondary | ICD-10-CM | POA: Diagnosis not present

## 2016-08-18 DIAGNOSIS — J438 Other emphysema: Secondary | ICD-10-CM | POA: Diagnosis not present

## 2016-08-18 DIAGNOSIS — M818 Other osteoporosis without current pathological fracture: Secondary | ICD-10-CM | POA: Diagnosis not present

## 2016-08-18 DIAGNOSIS — Z23 Encounter for immunization: Secondary | ICD-10-CM | POA: Diagnosis not present

## 2016-08-18 MED ORDER — OXYCODONE-ACETAMINOPHEN 10-325 MG PO TABS: ORAL_TABLET | ORAL | 0 refills | Status: DC

## 2016-08-18 MED ORDER — OMEPRAZOLE 20 MG PO CPDR: 20.0000 mg | DELAYED_RELEASE_CAPSULE | Freq: Every day | ORAL | 12 refills | Status: AC

## 2016-08-18 MED ORDER — ALPRAZOLAM 0.5 MG PO TABS: ORAL_TABLET | ORAL | 4 refills | Status: DC

## 2016-08-18 MED ORDER — ALENDRONATE SODIUM 70 MG PO TABS: ORAL_TABLET | ORAL | 12 refills | Status: DC

## 2016-08-18 NOTE — Progress Notes (Signed)
Subjective:    Patient ID: Amy Rubio, female    DOB: 03/24/1951, 65 y.o.   MRN: [id number]  HPI This patient was seen today for chronic pain  The medication list was reviewed and updated.   -Compliance with medication: yes  - Number patient states they take daily: 5 per day  -when was the last dose patient took? today  The patient was advised the importance of maintaining medication and not using illegal substances with these.  Refills needed: yes  The patient was educated that we can provide 3 monthly scripts for their medication, it is their responsibility to follow the instructions.  Side effects or complications from medications: side  Patient is aware that pain medications are meant to minimize the severity of the pain to allow their pain levels to improve to allow for better function. They are aware of that pain medications cannot totally remove their pain.  Due for UDT ( at least once per year) : done at last visit.   Needs refill on fosamax. Has not taken in months.   Needs refill on omeprazole.   Patient under a fair amount of stress. She does a best candidate managing it. She has a daughter who abuses medications who does not live in the household but is threatening to steal her medicine and threatened to try to tell lies about her. Patient is a smoker she know she needs to quit smoking she still intermittent coughing CAT scan was done earlier this year she'll be due for another one next year flu shot today COPD stable depression and anxiety stable   Review of Systems  Constitutional: Negative for activity change, appetite change, fatigue, fever and unexpected weight change.  HENT: Negative for congestion.   Respiratory: Positive for cough. Negative for wheezing.   Cardiovascular: Negative for chest pain.  Gastrointestinal: Negative for abdominal pain and vomiting.  Neurological: Negative for weakness.  Psychiatric/Behavioral: Negative for confusion.         Objective:   Physical Exam  Constitutional: She appears well-nourished. No distress.  Cardiovascular: Normal rate, regular rhythm and normal heart sounds.   No murmur heard. Pulmonary/Chest: Effort normal and breath sounds normal. No respiratory distress.  Musculoskeletal: She exhibits no edema.  Lymphadenopathy:    She has no cervical adenopathy.  Neurological: She is alert. She exhibits normal muscle tone.  Psychiatric: Her behavior is normal.  Vitals reviewed.         Assessment & Plan:  COPD stable encouraged patient to quit smoking she is unlikely to do so she will try to cut back continue inhalers as directed  The patient was seen today as part of a comprehensive visit regarding pain control. Patient's compliance with the medication as well as discussion regarding effectiveness was completed. Prescriptions were written. Patient was advised to follow-up in 3 months. The patient was assessed for any signs of severe side effects. The patient was advised to take the medicine as directed and to report to [place] if any side effect issues. Patient passed her urine drug screen earlier this year. Drug registry was checked. 3 prescriptions written. She has a daughter who has a drug problem who is been trying to steal her medicines a patient was told that if her medicines get stolen we will not be able to replace them  Osteoporosis is at high risk for fractures I recommend continuing the medications I warned her regarding atypical fractures and warned her regarding warning signs of atypical fracture  Reflux issues-omeprazole  controls this per patient request refills given  Chronic anxiety and depression taking her antidepressant doing well with this does not use Xanax more than twice a day. Ideally patient would not have to be on this but she has severe chronic anxiety along with severe chronic back pain. Patient is aware of the potential for accidental over do's especially if she missed uses  medicine

## 2016-08-19 ENCOUNTER — Other Ambulatory Visit: Payer: Self-pay | Admitting: Family Medicine

## 2016-09-28 ENCOUNTER — Other Ambulatory Visit: Payer: Self-pay | Admitting: Family Medicine

## 2016-11-10 ENCOUNTER — Other Ambulatory Visit: Payer: Self-pay | Admitting: Family Medicine

## 2016-11-11 NOTE — Telephone Encounter (Signed)
May have this prescription +5 additional refills

## 2016-11-17 ENCOUNTER — Encounter: Payer: Self-pay | Admitting: Family Medicine

## 2016-11-17 ENCOUNTER — Ambulatory Visit (INDEPENDENT_AMBULATORY_CARE_PROVIDER_SITE_OTHER): Payer: Medicare Other | Admitting: Family Medicine

## 2016-11-17 VITALS — BP 136/86 | Ht 65.0 in | Wt 113.2 lb

## 2016-11-17 DIAGNOSIS — J438 Other emphysema: Secondary | ICD-10-CM

## 2016-11-17 DIAGNOSIS — M818 Other osteoporosis without current pathological fracture: Secondary | ICD-10-CM

## 2016-11-17 DIAGNOSIS — G894 Chronic pain syndrome: Secondary | ICD-10-CM

## 2016-11-17 DIAGNOSIS — I10 Essential (primary) hypertension: Secondary | ICD-10-CM | POA: Diagnosis not present

## 2016-11-17 MED ORDER — OXYCODONE-ACETAMINOPHEN 10-325 MG PO TABS: ORAL_TABLET | ORAL | 0 refills | Status: DC

## 2016-11-17 NOTE — Progress Notes (Signed)
Subjective:    Patient ID: Amy Rubio, female    DOB: 21-Feb-1951, 66 y.o.   MRN: [id number]  HPI This patient was seen today for chronic pain  The medication list was reviewed and updated.   -Compliance with medication: yes  - Number patient states they take daily: 5 per day  -when was the last dose patient took? today  The patient was advised the importance of maintaining medication and not using illegal substances with these.  Refills needed: yes  The patient was educated that we can provide 3 monthly scripts for their medication, it is their responsibility to follow the instructions.  Side effects or complications from medications: none  Patient is aware that pain medications are meant to minimize the severity of the pain to allow their pain levels to improve to allow for better function. They are aware of that pain medications cannot totally remove their pain.  Due for UDT ( at least once per year) : 04/2016 Patient does take her medication for osteoporosis tolerates it well Takes her antidepressant states is under good control Xanax uses sparingly denies drowsiness Uses her Lyrica she states it does help denies trouble with it Does take her blood pressure medicine on a regular basis denies trouble tries to minimize salt intake   Patient still smokes she know she needs to quit she does use her inhalers     Review of Systems  Constitutional: Negative for activity change, appetite change and fatigue.  HENT: Negative for congestion.   Respiratory: Negative for cough.   Cardiovascular: Negative for chest pain.  Gastrointestinal: Negative for abdominal pain.  Endocrine: Negative for polydipsia and polyphagia.  Neurological: Negative for weakness.  Psychiatric/Behavioral: Negative for confusion.       Objective:   Physical Exam  Constitutional: She appears well-nourished. No distress.  Cardiovascular: Normal rate, regular rhythm and normal heart sounds.   No  murmur heard. Pulmonary/Chest: Effort normal and breath sounds normal. No respiratory distress.  Musculoskeletal: She exhibits no edema.  Lymphadenopathy:    She has no cervical adenopathy.  Neurological: She is alert. She exhibits normal muscle tone.  Psychiatric: Her behavior is normal.  Vitals reviewed.         Assessment & Plan:  The patient was seen today as part of a comprehensive visit regarding pain control. Patient's compliance with the medication as well as discussion regarding effectiveness was completed. Prescriptions were written. Patient was advised to follow-up in 3 months. The patient was assessed for any signs of severe side effects. The patient was advised to take the medicine as directed and to report to [place] if any side effect issues.  HTN- Patient was seen today as part of a visit regarding hypertension. The importance of healthy diet and regular physical activity was discussed. The importance of compliance with medications discussed. Ideal goal is to keep blood pressure low elevated levels certainly below [id number] when possible. The patient was counseled that keeping blood pressure under control lessen his risk of heart attack, stroke, kidney failure, and early death. The importance of regular follow-ups was discussed with the patient. Low-salt diet such as DASH recommended. Regular physical activity was recommended as well. Patient was advised to keep regular follow-ups.  COPD-patient needs to quit smoking she is to continue her COPD inhalers she does have some bronchial breath sounds no x-rays lab work indicated currently  Depression/anxiety-to not overuse Xanax she denies a causing drowsiness she is taking her antidepressant has a lot of family  stress  Follow-up 3 months  25 minutes was spent with the patient. Greater than half the time was spent in discussion and answering questions and counseling regarding the issues that the patient came in for today.

## 2017-01-30 ENCOUNTER — Encounter: Payer: Self-pay | Admitting: Family Medicine

## 2017-01-30 ENCOUNTER — Telehealth: Payer: Self-pay | Admitting: Family Medicine

## 2017-01-30 NOTE — Telephone Encounter (Signed)
Patient is requesting a letter for social services stating you have been treating her for COPD for years so she can receive help from them.

## 2017-01-30 NOTE — Telephone Encounter (Signed)
A letter was dictated please forward to the patient accordingly

## 2017-02-13 ENCOUNTER — Other Ambulatory Visit: Payer: Self-pay | Admitting: Family Medicine

## 2017-02-14 ENCOUNTER — Encounter: Payer: Self-pay | Admitting: Family Medicine

## 2017-02-14 ENCOUNTER — Ambulatory Visit (INDEPENDENT_AMBULATORY_CARE_PROVIDER_SITE_OTHER): Payer: Medicare Other | Admitting: Family Medicine

## 2017-02-14 VITALS — BP 136/88 | Ht 65.0 in | Wt 112.2 lb

## 2017-02-14 DIAGNOSIS — E784 Other hyperlipidemia: Secondary | ICD-10-CM | POA: Diagnosis not present

## 2017-02-14 DIAGNOSIS — M818 Other osteoporosis without current pathological fracture: Secondary | ICD-10-CM

## 2017-02-14 DIAGNOSIS — J441 Chronic obstructive pulmonary disease with (acute) exacerbation: Secondary | ICD-10-CM

## 2017-02-14 DIAGNOSIS — Z1159 Encounter for screening for other viral diseases: Secondary | ICD-10-CM | POA: Diagnosis not present

## 2017-02-14 DIAGNOSIS — I10 Essential (primary) hypertension: Secondary | ICD-10-CM | POA: Diagnosis not present

## 2017-02-14 DIAGNOSIS — E7849 Other hyperlipidemia: Secondary | ICD-10-CM

## 2017-02-14 MED ORDER — OXYCODONE-ACETAMINOPHEN 10-325 MG PO TABS: ORAL_TABLET | ORAL | 0 refills | Status: DC

## 2017-02-14 MED ORDER — PREDNISONE 20 MG PO TABS: ORAL_TABLET | ORAL | 0 refills | Status: DC

## 2017-02-14 MED ORDER — FLUOXETINE HCL 20 MG PO CAPS: ORAL_CAPSULE | ORAL | 5 refills | Status: DC

## 2017-02-14 MED ORDER — TRAZODONE HCL 50 MG PO TABS: 50.0000 mg | ORAL_TABLET | Freq: Every day | ORAL | 12 refills | Status: AC

## 2017-02-14 MED ORDER — ALPRAZOLAM 0.5 MG PO TABS: ORAL_TABLET | ORAL | 4 refills | Status: DC

## 2017-02-14 MED ORDER — TIOTROPIUM BROMIDE MONOHYDRATE 18 MCG IN CAPS: ORAL_CAPSULE | RESPIRATORY_TRACT | 5 refills | Status: DC

## 2017-02-14 MED ORDER — DOXYCYCLINE HYCLATE 100 MG PO TABS: 100.0000 mg | ORAL_TABLET | Freq: Two times a day (BID) | ORAL | 0 refills | Status: DC

## 2017-02-14 MED ORDER — PREGABALIN 150 MG PO CAPS
ORAL_CAPSULE | ORAL | 5 refills | Status: DC
Start: 2017-02-14 — End: 2017-05-08

## 2017-02-14 MED ORDER — ALBUTEROL SULFATE HFA 108 (90 BASE) MCG/ACT IN AERS: INHALATION_SPRAY | RESPIRATORY_TRACT | 5 refills | Status: DC

## 2017-02-14 MED ORDER — LISINOPRIL-HYDROCHLOROTHIAZIDE 20-12.5 MG PO TABS: ORAL_TABLET | ORAL | 1 refills | Status: DC

## 2017-02-14 MED ORDER — BUDESONIDE-FORMOTEROL FUMARATE 160-4.5 MCG/ACT IN AERO: 2.0000 | INHALATION_SPRAY | Freq: Two times a day (BID) | RESPIRATORY_TRACT | 3 refills | Status: DC

## 2017-02-14 NOTE — Progress Notes (Signed)
   Subjective:    Patient ID: Amy Rubio, female    DOB: 1951-05-29, 66 y.o.   MRN: [id number]  HPI This patient was seen today for chronic pain  The medication list was reviewed and updated.   -Compliance with medication: yes  - Number patient states they take daily: 5 a day  -when was the last dose patient took? today  The patient was advised the importance of maintaining medication and not using illegal substances with these.  Refills needed: yes  The patient was educated that we can provide 3 monthly scripts for their medication, it is their responsibility to follow the instructions.  Side effects or complications from medications: none  Patient is aware that pain medications are meant to minimize the severity of the pain to allow their pain levels to improve to allow for better function. They are aware of that pain medications cannot totally remove their pain.  Due for UDT ( at least once per year) : 04/18/16  Patient states her feet swell and burn at times esp after she ate a lot of chips. Patient also needs refill of xanax      Review of Systems  Constitutional: Negative for activity change and appetite change.  Gastrointestinal: Negative for abdominal pain and vomiting.  Neurological: Negative for weakness.  Psychiatric/Behavioral: Negative for confusion.       Objective:   Physical Exam  Constitutional: She appears well-nourished. No distress.  HENT:  Head: Normocephalic.  Cardiovascular: Normal rate, regular rhythm and normal heart sounds.   No murmur heard. Pulmonary/Chest: Effort normal and breath sounds normal.  Musculoskeletal: She exhibits no edema.  Lymphadenopathy:    She has no cervical adenopathy.  Neurological: She is alert.  Psychiatric: Her behavior is normal.  Vitals reviewed.  She does take her blood pressure medicine as directed watches her diet She takes her acid reflux medicine denies any problems currently States her pain medicine  does help her She does use her COPD medicine but she smokes she know she needs to quit in addition to this she is had increased coughing and congestion over the past several days denies high fever chills she does bring up some discolored mucus but no blood       Assessment & Plan:  Blood pressure good control continue current measures watch diet Severe back pain with sciatica issues Lyrica does well help controlling it also her pain medicine she denies abusing it drug registry was checked 3 prescriptions given COPD mild exacerbation antibiotics prednisone continue her COPD medicine patient counseled to quit smoking Hyperlipidemia previous labs reviewed Disordered watch diet Hep C screening ordered Patient with anxiety and depression continue Prozac and use Xanax but not at bedtime she denies a causing drowsiness she is been on this for a long span of time along with her pain medicine she has been warned about the possibility of a drug interaction and increased risk of overdose

## 2017-05-08 ENCOUNTER — Other Ambulatory Visit: Payer: Self-pay | Admitting: Family Medicine

## 2017-05-08 NOTE — Telephone Encounter (Signed)
Refill times 6 

## 2017-05-17 ENCOUNTER — Encounter: Payer: Self-pay | Admitting: Family Medicine

## 2017-05-17 ENCOUNTER — Ambulatory Visit (INDEPENDENT_AMBULATORY_CARE_PROVIDER_SITE_OTHER): Payer: Medicare Other | Admitting: Family Medicine

## 2017-05-17 VITALS — BP 118/70 | Temp 98.0°F | Ht 65.0 in | Wt 112.0 lb

## 2017-05-17 DIAGNOSIS — I10 Essential (primary) hypertension: Secondary | ICD-10-CM

## 2017-05-17 DIAGNOSIS — Z13220 Encounter for screening for lipoid disorders: Secondary | ICD-10-CM

## 2017-05-17 DIAGNOSIS — R490 Dysphonia: Secondary | ICD-10-CM | POA: Diagnosis not present

## 2017-05-17 DIAGNOSIS — J441 Chronic obstructive pulmonary disease with (acute) exacerbation: Secondary | ICD-10-CM

## 2017-05-17 DIAGNOSIS — G894 Chronic pain syndrome: Secondary | ICD-10-CM | POA: Diagnosis not present

## 2017-05-17 DIAGNOSIS — Z1211 Encounter for screening for malignant neoplasm of colon: Secondary | ICD-10-CM | POA: Diagnosis not present

## 2017-05-17 DIAGNOSIS — R5383 Other fatigue: Secondary | ICD-10-CM

## 2017-05-17 DIAGNOSIS — Z79891 Long term (current) use of opiate analgesic: Secondary | ICD-10-CM | POA: Diagnosis not present

## 2017-05-17 MED ORDER — OXYCODONE-ACETAMINOPHEN 10-325 MG PO TABS: ORAL_TABLET | ORAL | 0 refills | Status: DC

## 2017-05-17 MED ORDER — PREDNISONE 20 MG PO TABS: ORAL_TABLET | ORAL | 0 refills | Status: DC

## 2017-05-17 MED ORDER — DOXYCYCLINE HYCLATE 100 MG PO TABS: 100.0000 mg | ORAL_TABLET | Freq: Two times a day (BID) | ORAL | 0 refills | Status: DC

## 2017-05-17 NOTE — Progress Notes (Signed)
   Subjective:    Patient ID: Amy Rubio, female    DOB: [birth year], 66 y.o.   MRN: [id number]  HPI This patient was seen today for chronic pain. Takes for pain from head to toe  The medication list was reviewed and updated.   -Compliance with medication: yes  - Number patient states they take daily: 5 per day  -when was the last dose patient took? today  The patient was advised the importance of maintaining medication and not using illegal substances with these.  Refills needed: yes  The patient was educated that we can provide 3 monthly scripts for their medication, it is their responsibility to follow the instructions.  Side effects or complications from medications: none  Patient is aware that pain medications are meant to minimize the severity of the pain to allow their pain levels to improve to allow for better function. They are aware of that pain medications cannot totally remove their pain.  Due for UDT ( at least once per year) : due today  Cough, wheezing. Started about one and a half weeks ago.         Review of Systems     Objective:   Physical Exam        Assessment & Plan:

## 2017-05-17 NOTE — Progress Notes (Signed)
Subjective:    Patient ID: Amy Rubio, female    DOB: 04/03/1951, 66 y.o.   MRN: [id number]  HPI This patient was seen today for chronic pain  The medication list was reviewed and updated.   -Compliance with medication: She states she takes her medicine daily  - Number patient states they take daily: Typically 5 pills a day  -when was the last dose patient took? She states she took some earlier today  The patient was advised the importance of maintaining medication and not using illegal substances with these.  Refills needed: She does need refills  The patient was educated that we can provide 3 monthly scripts for their medication, it is their responsibility to follow the instructions.  Side effects or complications from medications: She denies side effects with her medication does not cause her drowsiness  Patient is aware that pain medications are meant to minimize the severity of the pain to allow their pain levels to improve to allow for better function. They are aware of that pain medications cannot totally remove their pain.  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    25 minutes was spent with the patient. Greater than half the time was spent in discussion and answering questions and counseling regarding the issues that the patient came in for today.    Review of Systems  Constitutional: Negative for activity change, appetite change and fever.  HENT: Positive for rhinorrhea and voice change. Negative for congestion and ear pain.   Eyes: Negative for discharge.  Respiratory: Positive for cough, shortness of breath and wheezing.   Cardiovascular: Negative for chest pain.  Gastrointestinal: Negative for abdominal pain and vomiting.  Skin: Negative for color change.  Neurological: Negative for weakness.  Psychiatric/Behavioral: Negative for confusion.       Objective:   Physical Exam  Constitutional: She appears well-developed and well-nourished. No distress.  HENT:  Head: Normocephalic and atraumatic.  Right Ear: External ear normal.  Left Ear: External ear normal.  Nose: Nose normal.  Mouth/Throat: Oropharynx is clear and moist. No oropharyngeal exudate.  Eyes: Right eye exhibits no discharge. Left eye exhibits no discharge.  Neck: Neck supple. No tracheal deviation present.  Cardiovascular: Normal rate, regular rhythm and normal heart sounds.   No murmur heard. Pulmonary/Chest: Effort normal. No respiratory distress. She has wheezes. She has no rales.  Musculoskeletal: She exhibits no edema.  Lymphadenopathy:    She has no cervical adenopathy.  Neurological: She is alert. She exhibits normal muscle tone.  Skin: Skin is warm and dry. No erythema.  Psychiatric: Her behavior is normal.  Nursing note and vitals reviewed.  Assessment & Plan:  The patient was seen today as part of a comprehensive visit regarding pain control. Patient's compliance with the medication as well as discussion regarding effectiveness was completed. Prescriptions were written. Patient was advised to follow-up in 3 months. The patient was  assessed for any signs of severe side effects. The patient was advised to take the medicine as directed and to report to [place] if any side effect issues.  COPD flareup prednisone antibiotic follow-up to ER if worse follow-up here or problems  Lung cancer screening face-to-face was done today which she shared decision making. Following elements were covered. There is no signs of lung cancer currently. The patient does smoke she has greater than a 50-pack-year history she has been counseled regarding the benefits and screening in the harms of testing overdiagnosis false positives and radiation exposure she has been counseled about quitting smoking she feels she is unable to do so currently. Will refer for low-dose CT  Significant fatigue tiredness lab work ordered await the results  Patient due for colonoscopy not physical shape to do at the we will do a stool study for blood

## 2017-05-22 LAB — PLEASE NOTE

## 2017-05-22 LAB — TOXASSURE SELECT 13 (MW), URINE

## 2017-05-23 ENCOUNTER — Encounter: Payer: Self-pay | Admitting: Family Medicine

## 2017-05-24 ENCOUNTER — Other Ambulatory Visit: Payer: Self-pay | Admitting: Acute Care

## 2017-05-24 DIAGNOSIS — Z122 Encounter for screening for malignant neoplasm of respiratory organs: Secondary | ICD-10-CM

## 2017-05-24 DIAGNOSIS — F17210 Nicotine dependence, cigarettes, uncomplicated: Principal | ICD-10-CM

## 2017-05-27 ENCOUNTER — Encounter: Payer: Self-pay | Admitting: Family Medicine

## 2017-05-28 ENCOUNTER — Other Ambulatory Visit: Payer: Self-pay | Admitting: *Deleted

## 2017-05-28 MED ORDER — NALOXONE HCL 4 MG/0.1ML NA LIQD: NASAL | 0 refills | Status: DC

## 2017-05-28 NOTE — Addendum Note (Signed)
Addended by: Dairl Ponder on: 05/28/2017 10:26 AM   Modules accepted: Orders

## 2017-06-08 ENCOUNTER — Ambulatory Visit (INDEPENDENT_AMBULATORY_CARE_PROVIDER_SITE_OTHER): Payer: Medicare Other | Admitting: Family Medicine

## 2017-06-08 ENCOUNTER — Encounter: Payer: Self-pay | Admitting: Family Medicine

## 2017-06-08 VITALS — BP 162/72 | Ht 65.0 in | Wt 109.0 lb

## 2017-06-08 DIAGNOSIS — G894 Chronic pain syndrome: Secondary | ICD-10-CM | POA: Diagnosis not present

## 2017-06-08 MED ORDER — DICLOFENAC SODIUM 75 MG PO TBEC: 75.0000 mg | DELAYED_RELEASE_TABLET | Freq: Two times a day (BID) | ORAL | 0 refills | Status: DC

## 2017-06-08 NOTE — Progress Notes (Signed)
   Subjective:    Patient ID: Amy Rubio, female    DOB: 03-06-1951, 66 y.o.   MRN: [id number]  HPI Patient states she was asked to make an appt today to discuss the oxycodone and xanax. This patient has been on treatment for pain medication for chronic back and joint pain for years. She recently had a urine drug screen which was significantly abnormal. It contained 2 different medications than what we were prescribing. The patient admits to taking medication that was not hers because she stated she was having severe toothache on a particular day. She denies abusing medicine. She denies being addicted the medicine. She does not want to go for any evaluation or treatment. She states she made a mistake and she states she would not do this again.  Long conversation held with patient 20 minutes with the patient today.  Urine drug screen in the past was negative for using other peoples medicine. Checking in the past for abuse of prescriptions was negative in the [employer].  Review of Systems     Objective:   Physical Exam No physical exam on today's evaluation. Time was spent with her discussing her recent situation.  Because of these issues and situation we will no longer be able to provide ongoing care. We will provide emergency care for 30 days. She has already received a letter.     Assessment & Plan:  Patient with chronic back pain and osteoarthritis. She is had chronic pain syndrome for years. She is been treated with pain medication, oxycodone 5 per day.  Recently patient's urine drug screen was abnormal. On discussion today the patient stated that she was having severe tooth pain and took some additional medications. She denies being addicted the medicine. She denies abusing medicine.See discussion above.  The patient was informed that she may use anti-inflammatories for her discomfort currently. If she is to be back on pain medication in the future it needs to be under  the care of pain management for close monitoring.  We will refer her to pain management for further evaluation. I have informed the patient that I will not be prescribing pain medications for her. She would need further evaluation on whether or not pain management would assist her.

## 2017-06-12 ENCOUNTER — Telehealth: Payer: Self-pay | Admitting: Family Medicine

## 2017-06-12 NOTE — Telephone Encounter (Signed)
Pt is being discharged from our practice  Referral was put in for pain management.  Should we cancel referral and let her new PCP (once she gets one) handle her pain management needs & referrals?  Please advise

## 2017-06-12 NOTE — Telephone Encounter (Signed)
It would be fine for [place] to send a referral but if they denies seeing her then I would recommend just send her a note letting them know that they denied

## 2017-06-15 ENCOUNTER — Encounter: Payer: Self-pay | Admitting: Family Medicine

## 2017-06-18 ENCOUNTER — Telehealth: Payer: Self-pay | Admitting: Acute Care

## 2017-06-18 NOTE — Telephone Encounter (Signed)
Opened in error

## 2017-06-19 ENCOUNTER — Telehealth: Payer: Self-pay | Admitting: Acute Care

## 2017-06-20 ENCOUNTER — Encounter: Payer: Medicare Other | Admitting: Acute Care

## 2017-06-20 ENCOUNTER — Ambulatory Visit (HOSPITAL_COMMUNITY): Payer: Medicare Other

## 2017-06-20 NOTE — Telephone Encounter (Signed)
Spoke with pt.  She would like to hold off on scheduling [facility] and Ct.  Requested a call back at a later time.  Will defer for now .  Will close this message and refer to referral notes.

## 2017-06-20 NOTE — Telephone Encounter (Signed)
Will forward to [facility] to make her aware.

## 2017-06-21 ENCOUNTER — Ambulatory Visit (INDEPENDENT_AMBULATORY_CARE_PROVIDER_SITE_OTHER): Payer: Medicare Other | Admitting: Otolaryngology

## 2017-06-21 DIAGNOSIS — J381 Polyp of vocal cord and larynx: Secondary | ICD-10-CM | POA: Diagnosis not present

## 2017-06-21 DIAGNOSIS — F17210 Nicotine dependence, cigarettes, uncomplicated: Secondary | ICD-10-CM

## 2017-06-21 DIAGNOSIS — R490 Dysphonia: Secondary | ICD-10-CM | POA: Diagnosis not present

## 2017-06-26 ENCOUNTER — Ambulatory Visit (INDEPENDENT_AMBULATORY_CARE_PROVIDER_SITE_OTHER): Payer: Medicare Other | Admitting: Family Medicine

## 2017-06-26 ENCOUNTER — Encounter: Payer: Self-pay | Admitting: Family Medicine

## 2017-06-26 VITALS — BP 144/70 | Temp 97.9°F | Wt 104.1 lb

## 2017-06-26 DIAGNOSIS — J441 Chronic obstructive pulmonary disease with (acute) exacerbation: Secondary | ICD-10-CM | POA: Diagnosis not present

## 2017-06-26 DIAGNOSIS — R053 Chronic cough: Secondary | ICD-10-CM

## 2017-06-26 DIAGNOSIS — R634 Abnormal weight loss: Secondary | ICD-10-CM

## 2017-06-26 DIAGNOSIS — R05 Cough: Secondary | ICD-10-CM

## 2017-06-26 MED ORDER — CEFDINIR 300 MG PO CAPS: 300.0000 mg | ORAL_CAPSULE | Freq: Two times a day (BID) | ORAL | 0 refills | Status: DC

## 2017-06-26 MED ORDER — GABAPENTIN 100 MG PO CAPS: 100.0000 mg | ORAL_CAPSULE | Freq: Three times a day (TID) | ORAL | 3 refills | Status: DC

## 2017-06-26 MED ORDER — PREDNISONE 20 MG PO TABS: ORAL_TABLET | ORAL | 0 refills | Status: DC

## 2017-06-26 NOTE — Progress Notes (Signed)
   Subjective:    Patient ID: Amy Rubio, female    DOB: 1950-10-08, 66 y.o.   MRN: [id number]  Cough  This is a new problem. The current episode started in the past 7 days. Associated symptoms include ear pain, a fever, headaches, nasal congestion, rhinorrhea and wheezing. Pertinent negatives include no chest pain or shortness of breath. Associated symptoms comments: Diarrhea, . Treatments tried: Tylenol, Claritin   This patient is currently under emergency care after being discharged. She relates ongoing coughing congestion with low bit of shortness of breath. She has dropped a couple pounds. She does have a history of smoking. She also was seen by ENT she was referred by asked couple months ago apparently has what appears to be a benign nodule in her throat more than likely to have surgery next month  Patient does have chronic pain we are no longer prescribing her pain medication  Patient states no other concerns this visit.   Review of Systems  Constitutional: Positive for fever. Negative for activity change.  HENT: Positive for congestion, ear pain and rhinorrhea.   Eyes: Negative for discharge.  Respiratory: Positive for cough and wheezing. Negative for shortness of breath.   Cardiovascular: Negative for chest pain.  Neurological: Positive for headaches.       Objective:   Physical Exam  Constitutional: She appears well-developed.  HENT:  Head: Normocephalic.  Right Ear: External ear normal.  Left Ear: External ear normal.  Nose: Nose normal.  Mouth/Throat: Oropharynx is clear and moist. No oropharyngeal exudate.  Eyes: Right eye exhibits no discharge. Left eye exhibits no discharge.  Neck: Neck supple. No tracheal deviation present.  Cardiovascular: Normal rate and normal heart sounds.   No murmur heard. Pulmonary/Chest: Effort normal and breath sounds normal. She has no wheezes. She has no rales.  Lymphadenopathy:    She has no cervical adenopathy.  Skin: Skin is warm  and dry.  Nursing note and vitals reviewed.         Assessment & Plan:  Hoarseness-ENT has found a polyp they will be doing surgery in the near future Patient has been encouraged to quit smoking COPD exacerbation Prednisone taper Antibiotics prescribed Slight weight loss Chest x-ray order May need additional testing She will do a follow-up office visit in 2 weeks to recheck this particular illness Patient is currently under emergency care she is in the process of finding a new primary care provider

## 2017-06-27 ENCOUNTER — Ambulatory Visit (HOSPITAL_COMMUNITY)
Admission: RE | Admit: 2017-06-27 | Discharge: 2017-06-27 | Disposition: A | Discharge status: Home / Self Care | Payer: Medicare Other | Source: Ambulatory Visit | Attending: Family Medicine | Admitting: Family Medicine

## 2017-06-27 ENCOUNTER — Other Ambulatory Visit: Payer: Self-pay | Admitting: Otolaryngology

## 2017-06-27 DIAGNOSIS — R634 Abnormal weight loss: Secondary | ICD-10-CM | POA: Diagnosis not present

## 2017-06-27 DIAGNOSIS — J449 Chronic obstructive pulmonary disease, unspecified: Secondary | ICD-10-CM | POA: Diagnosis not present

## 2017-06-27 DIAGNOSIS — R05 Cough: Secondary | ICD-10-CM | POA: Diagnosis not present

## 2017-07-11 ENCOUNTER — Ambulatory Visit (INDEPENDENT_AMBULATORY_CARE_PROVIDER_SITE_OTHER): Payer: Medicare Other | Admitting: Family Medicine

## 2017-07-11 ENCOUNTER — Encounter: Payer: Self-pay | Admitting: Family Medicine

## 2017-07-11 VITALS — BP 122/70 | Temp 97.5°F | Ht 65.0 in | Wt 111.0 lb

## 2017-07-11 DIAGNOSIS — J438 Other emphysema: Secondary | ICD-10-CM

## 2017-07-11 DIAGNOSIS — Z23 Encounter for immunization: Secondary | ICD-10-CM | POA: Diagnosis not present

## 2017-07-11 DIAGNOSIS — R634 Abnormal weight loss: Secondary | ICD-10-CM | POA: Diagnosis not present

## 2017-07-11 DIAGNOSIS — F17200 Nicotine dependence, unspecified, uncomplicated: Secondary | ICD-10-CM

## 2017-07-11 DIAGNOSIS — J411 Mucopurulent chronic bronchitis: Secondary | ICD-10-CM

## 2017-07-11 DIAGNOSIS — R053 Chronic cough: Secondary | ICD-10-CM

## 2017-07-11 DIAGNOSIS — R05 Cough: Secondary | ICD-10-CM | POA: Diagnosis not present

## 2017-07-11 DIAGNOSIS — R059 Cough, unspecified: Secondary | ICD-10-CM

## 2017-07-11 MED ORDER — DOXYCYCLINE HYCLATE 100 MG PO CAPS: 100.0000 mg | ORAL_CAPSULE | Freq: Two times a day (BID) | ORAL | 0 refills | Status: DC

## 2017-07-11 NOTE — Progress Notes (Signed)
   Subjective:    Patient ID: Amy Rubio, female    DOB: July 24, 1951, 66 y.o.   MRN: [id number]  HPI    Review of Systems     Objective:   Physical Exam        Assessment & Plan:

## 2017-07-11 NOTE — Progress Notes (Signed)
   Subjective:    Patient ID: Amy Rubio, female    DOB: 12/29/1950, 66 y.o.   MRN: [id number]  Cough  This is a new problem. Associated symptoms include shortness of breath and wheezing. Treatments tried: takes last antibiotic today.  Overall the patient relates she still smokes she is trying to quit unable to do so she relates no hemoptysis but she keeps bringing up very discolored phlegm and she keeps having progressive wheezing and coughing congestion and at times shortness of breath she denies flank pain she denies abdominal pain she states her appetite is low she does try to eat on a regular basis Wants flu vaccine.    Review of Systems  Respiratory: Positive for cough, shortness of breath and wheezing.   Negative chest pressure denies headaches relates joint pains denies leg swelling     Objective:   Physical Exam neck no masses lungs do not have rails or rhonchi but does have significant chest congestion wheezing coughing along with thick purulent mucus      Assessment & Plan:  Chronic cough Smoker Patient has been counseled to quit Weight loss Persistent COPD Given all of these findings I believe the patient should go ahead with having a CT scan of the chest Patient did do chest x-ray toward the end of September  I suspect the possibility of pneumonia as well even though her x-ray recently did not show this. A CAT scan will help see if there is bronchiectasis or if there is a potential small tumor complicating her lung issue making it more difficult for her to clear.  We were trying to get a screening CT scan of the lung under her Medicare but patient unable to get to [facility] for Medicare screening questionnaire session  We will see the patient back in 2-3 weeks she is in the process of getting a new physician because she has been released from our care

## 2017-07-14 ENCOUNTER — Inpatient Hospital Stay (HOSPITAL_COMMUNITY)
Admission: EM | Admit: 2017-07-14 | Discharge: 2017-07-18 | DRG: 481 | Disposition: A | Discharge status: Skilled Nursing Facility | Payer: No Typology Code available for payment source | Attending: Internal Medicine | Admitting: Internal Medicine

## 2017-07-14 ENCOUNTER — Emergency Department (HOSPITAL_COMMUNITY): Payer: No Typology Code available for payment source

## 2017-07-14 ENCOUNTER — Encounter (HOSPITAL_COMMUNITY): Payer: Self-pay | Admitting: Emergency Medicine

## 2017-07-14 DIAGNOSIS — S72101A Unspecified trochanteric fracture of right femur, initial encounter for closed fracture: Secondary | ICD-10-CM | POA: Diagnosis not present

## 2017-07-14 DIAGNOSIS — Z681 Body mass index (BMI) 19 or less, adult: Secondary | ICD-10-CM

## 2017-07-14 DIAGNOSIS — I10 Essential (primary) hypertension: Secondary | ICD-10-CM | POA: Diagnosis not present

## 2017-07-14 DIAGNOSIS — F17210 Nicotine dependence, cigarettes, uncomplicated: Secondary | ICD-10-CM | POA: Diagnosis present

## 2017-07-14 DIAGNOSIS — Z800 Family history of malignant neoplasm of digestive organs: Secondary | ICD-10-CM

## 2017-07-14 DIAGNOSIS — J441 Chronic obstructive pulmonary disease with (acute) exacerbation: Secondary | ICD-10-CM | POA: Diagnosis present

## 2017-07-14 DIAGNOSIS — S8992XA Unspecified injury of left lower leg, initial encounter: Secondary | ICD-10-CM | POA: Diagnosis not present

## 2017-07-14 DIAGNOSIS — M62838 Other muscle spasm: Secondary | ICD-10-CM | POA: Diagnosis not present

## 2017-07-14 DIAGNOSIS — Z961 Presence of intraocular lens: Secondary | ICD-10-CM | POA: Diagnosis present

## 2017-07-14 DIAGNOSIS — E785 Hyperlipidemia, unspecified: Secondary | ICD-10-CM | POA: Diagnosis present

## 2017-07-14 DIAGNOSIS — M810 Age-related osteoporosis without current pathological fracture: Secondary | ICD-10-CM | POA: Diagnosis present

## 2017-07-14 DIAGNOSIS — R52 Pain, unspecified: Secondary | ICD-10-CM

## 2017-07-14 DIAGNOSIS — R636 Underweight: Secondary | ICD-10-CM | POA: Diagnosis present

## 2017-07-14 DIAGNOSIS — G894 Chronic pain syndrome: Secondary | ICD-10-CM | POA: Diagnosis present

## 2017-07-14 DIAGNOSIS — E871 Hypo-osmolality and hyponatremia: Secondary | ICD-10-CM | POA: Diagnosis present

## 2017-07-14 DIAGNOSIS — E861 Hypovolemia: Secondary | ICD-10-CM | POA: Diagnosis present

## 2017-07-14 DIAGNOSIS — S72002A Fracture of unspecified part of neck of left femur, initial encounter for closed fracture: Secondary | ICD-10-CM | POA: Diagnosis not present

## 2017-07-14 DIAGNOSIS — Z8249 Family history of ischemic heart disease and other diseases of the circulatory system: Secondary | ICD-10-CM

## 2017-07-14 DIAGNOSIS — Z90710 Acquired absence of both cervix and uterus: Secondary | ICD-10-CM

## 2017-07-14 DIAGNOSIS — S72001A Fracture of unspecified part of neck of right femur, initial encounter for closed fracture: Secondary | ICD-10-CM | POA: Diagnosis not present

## 2017-07-14 DIAGNOSIS — Z7951 Long term (current) use of inhaled steroids: Secondary | ICD-10-CM

## 2017-07-14 DIAGNOSIS — R1319 Other dysphagia: Secondary | ICD-10-CM | POA: Diagnosis present

## 2017-07-14 DIAGNOSIS — Z7983 Long term (current) use of bisphosphonates: Secondary | ICD-10-CM

## 2017-07-14 DIAGNOSIS — Z79899 Other long term (current) drug therapy: Secondary | ICD-10-CM

## 2017-07-14 DIAGNOSIS — E876 Hypokalemia: Secondary | ICD-10-CM | POA: Diagnosis present

## 2017-07-14 DIAGNOSIS — F419 Anxiety disorder, unspecified: Secondary | ICD-10-CM | POA: Diagnosis present

## 2017-07-14 DIAGNOSIS — S6992XA Unspecified injury of left wrist, hand and finger(s), initial encounter: Secondary | ICD-10-CM | POA: Diagnosis not present

## 2017-07-14 DIAGNOSIS — I739 Peripheral vascular disease, unspecified: Secondary | ICD-10-CM | POA: Diagnosis present

## 2017-07-14 MED ORDER — ONDANSETRON HCL 4 MG/2ML IJ SOLN
4.0000 mg | Freq: Once | INTRAMUSCULAR | Status: AC
  Administered 2017-07-14: 4 mg via INTRAVENOUS
  Filled 2017-07-14: qty 2

## 2017-07-14 MED ORDER — MORPHINE SULFATE (PF) 4 MG/ML IV SOLN
4.0000 mg | Freq: Once | INTRAVENOUS | Status: AC
  Administered 2017-07-14: 4 mg via INTRAVENOUS
  Filled 2017-07-14: qty 1

## 2017-07-14 MED ORDER — OXYCODONE-ACETAMINOPHEN 5-325 MG PO TABS
1.0000 | ORAL_TABLET | Freq: Once | ORAL | Status: AC
  Administered 2017-07-14: 1 via ORAL
  Filled 2017-07-14: qty 1
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
## 2017-07-15 ENCOUNTER — Encounter (HOSPITAL_COMMUNITY): Payer: Self-pay | Admitting: Family Medicine

## 2017-07-15 ENCOUNTER — Inpatient Hospital Stay (HOSPITAL_COMMUNITY): Payer: No Typology Code available for payment source | Admitting: Anesthesiology

## 2017-07-15 ENCOUNTER — Inpatient Hospital Stay (HOSPITAL_COMMUNITY): Payer: No Typology Code available for payment source

## 2017-07-15 ENCOUNTER — Encounter (HOSPITAL_COMMUNITY)
Admission: EM | Disposition: A | Discharge status: Skilled Nursing Facility | Payer: Self-pay | Source: Home / Self Care | Attending: Internal Medicine

## 2017-07-15 DIAGNOSIS — Z90710 Acquired absence of both cervix and uterus: Secondary | ICD-10-CM | POA: Diagnosis not present

## 2017-07-15 DIAGNOSIS — G8929 Other chronic pain: Secondary | ICD-10-CM | POA: Diagnosis not present

## 2017-07-15 DIAGNOSIS — F411 Generalized anxiety disorder: Secondary | ICD-10-CM | POA: Diagnosis not present

## 2017-07-15 DIAGNOSIS — M25552 Pain in left hip: Secondary | ICD-10-CM | POA: Diagnosis not present

## 2017-07-15 DIAGNOSIS — E785 Hyperlipidemia, unspecified: Secondary | ICD-10-CM | POA: Diagnosis present

## 2017-07-15 DIAGNOSIS — R293 Abnormal posture: Secondary | ICD-10-CM | POA: Diagnosis not present

## 2017-07-15 DIAGNOSIS — S72002D Fracture of unspecified part of neck of left femur, subsequent encounter for closed fracture with routine healing: Secondary | ICD-10-CM | POA: Diagnosis not present

## 2017-07-15 DIAGNOSIS — S72101A Unspecified trochanteric fracture of right femur, initial encounter for closed fracture: Secondary | ICD-10-CM | POA: Diagnosis not present

## 2017-07-15 DIAGNOSIS — R2689 Other abnormalities of gait and mobility: Secondary | ICD-10-CM | POA: Diagnosis not present

## 2017-07-15 DIAGNOSIS — E871 Hypo-osmolality and hyponatremia: Secondary | ICD-10-CM | POA: Diagnosis not present

## 2017-07-15 DIAGNOSIS — Z800 Family history of malignant neoplasm of digestive organs: Secondary | ICD-10-CM | POA: Diagnosis not present

## 2017-07-15 DIAGNOSIS — M810 Age-related osteoporosis without current pathological fracture: Secondary | ICD-10-CM | POA: Diagnosis present

## 2017-07-15 DIAGNOSIS — Z961 Presence of intraocular lens: Secondary | ICD-10-CM | POA: Diagnosis present

## 2017-07-15 DIAGNOSIS — G894 Chronic pain syndrome: Secondary | ICD-10-CM | POA: Diagnosis present

## 2017-07-15 DIAGNOSIS — Z79899 Other long term (current) drug therapy: Secondary | ICD-10-CM | POA: Diagnosis not present

## 2017-07-15 DIAGNOSIS — M6281 Muscle weakness (generalized): Secondary | ICD-10-CM | POA: Diagnosis not present

## 2017-07-15 DIAGNOSIS — E876 Hypokalemia: Secondary | ICD-10-CM | POA: Diagnosis not present

## 2017-07-15 DIAGNOSIS — J441 Chronic obstructive pulmonary disease with (acute) exacerbation: Secondary | ICD-10-CM | POA: Diagnosis not present

## 2017-07-15 DIAGNOSIS — S72002A Fracture of unspecified part of neck of left femur, initial encounter for closed fracture: Secondary | ICD-10-CM | POA: Diagnosis not present

## 2017-07-15 DIAGNOSIS — F419 Anxiety disorder, unspecified: Secondary | ICD-10-CM

## 2017-07-15 DIAGNOSIS — Z8249 Family history of ischemic heart disease and other diseases of the circulatory system: Secondary | ICD-10-CM | POA: Diagnosis not present

## 2017-07-15 DIAGNOSIS — F17210 Nicotine dependence, cigarettes, uncomplicated: Secondary | ICD-10-CM | POA: Diagnosis present

## 2017-07-15 DIAGNOSIS — Z7983 Long term (current) use of bisphosphonates: Secondary | ICD-10-CM | POA: Diagnosis not present

## 2017-07-15 DIAGNOSIS — Z7951 Long term (current) use of inhaled steroids: Secondary | ICD-10-CM | POA: Diagnosis not present

## 2017-07-15 DIAGNOSIS — Z681 Body mass index (BMI) 19 or less, adult: Secondary | ICD-10-CM | POA: Diagnosis not present

## 2017-07-15 DIAGNOSIS — I739 Peripheral vascular disease, unspecified: Secondary | ICD-10-CM | POA: Diagnosis present

## 2017-07-15 DIAGNOSIS — M62838 Other muscle spasm: Secondary | ICD-10-CM | POA: Diagnosis not present

## 2017-07-15 DIAGNOSIS — R636 Underweight: Secondary | ICD-10-CM | POA: Diagnosis present

## 2017-07-15 DIAGNOSIS — I10 Essential (primary) hypertension: Secondary | ICD-10-CM

## 2017-07-15 DIAGNOSIS — R1319 Other dysphagia: Secondary | ICD-10-CM | POA: Diagnosis present

## 2017-07-15 DIAGNOSIS — Z4789 Encounter for other orthopedic aftercare: Secondary | ICD-10-CM | POA: Diagnosis not present

## 2017-07-15 DIAGNOSIS — J449 Chronic obstructive pulmonary disease, unspecified: Secondary | ICD-10-CM | POA: Diagnosis not present

## 2017-07-15 DIAGNOSIS — E861 Hypovolemia: Secondary | ICD-10-CM | POA: Diagnosis present

## 2017-07-15 HISTORY — PX: HIP PINNING,CANNULATED: SHX1758

## 2017-07-15 LAB — BASIC METABOLIC PANEL
ANION GAP: 8 (ref 5–15)
Anion gap: 7 (ref 5–15)
BUN: 7 mg/dL (ref 6–20)
BUN: 8 mg/dL (ref 6–20)
CALCIUM: 8.5 mg/dL — AB (ref 8.9–10.3)
CO2: 28 mmol/L (ref 22–32)
CO2: 29 mmol/L (ref 22–32)
CREATININE: 0.65 mg/dL (ref 0.44–1.00)
Calcium: 8.7 mg/dL — ABNORMAL LOW (ref 8.9–10.3)
Chloride: 94 mmol/L — ABNORMAL LOW (ref 101–111)
Chloride: 97 mmol/L — ABNORMAL LOW (ref 101–111)
Creatinine, Ser: 0.65 mg/dL (ref 0.44–1.00)
GLUCOSE: 106 mg/dL — AB (ref 65–99)
Glucose, Bld: 91 mg/dL (ref 65–99)
Potassium: 3.4 mmol/L — ABNORMAL LOW (ref 3.5–5.1)
Potassium: 3.6 mmol/L (ref 3.5–5.1)
SODIUM: 131 mmol/L — AB (ref 135–145)
Sodium: 132 mmol/L — ABNORMAL LOW (ref 135–145)

## 2017-07-15 LAB — CBC
HCT: 35.2 % — ABNORMAL LOW (ref 36.0–46.0)
Hemoglobin: 11.5 g/dL — ABNORMAL LOW (ref 12.0–15.0)
MCH: 32.3 pg (ref 26.0–34.0)
MCHC: 32.7 g/dL (ref 30.0–36.0)
MCV: 98.9 fL (ref 78.0–100.0)
PLATELETS: 211 10*3/uL (ref 150–400)
RBC: 3.56 MIL/uL — ABNORMAL LOW (ref 3.87–5.11)
RDW: 15.4 % (ref 11.5–15.5)
WBC: 6 10*3/uL (ref 4.0–10.5)

## 2017-07-15 LAB — CBC WITH DIFFERENTIAL/PLATELET
BASOS PCT: 0 %
Basophils Absolute: 0 10*3/uL (ref 0.0–0.1)
EOS ABS: 0.1 10*3/uL (ref 0.0–0.7)
EOS PCT: 1 %
HCT: 36.9 % (ref 36.0–46.0)
HEMOGLOBIN: 11.9 g/dL — AB (ref 12.0–15.0)
Lymphocytes Relative: 9 %
Lymphs Abs: 0.7 10*3/uL (ref 0.7–4.0)
MCH: 31.8 pg (ref 26.0–34.0)
MCHC: 32.2 g/dL (ref 30.0–36.0)
MCV: 98.7 fL (ref 78.0–100.0)
Monocytes Absolute: 0.4 10*3/uL (ref 0.1–1.0)
Monocytes Relative: 5 %
NEUTROS PCT: 85 %
Neutro Abs: 7 10*3/uL (ref 1.7–7.7)
PLATELETS: 226 10*3/uL (ref 150–400)
RBC: 3.74 MIL/uL — AB (ref 3.87–5.11)
RDW: 15.4 % (ref 11.5–15.5)
WBC: 8.2 10*3/uL (ref 4.0–10.5)

## 2017-07-15 LAB — SURGICAL PCR SCREEN
MRSA, PCR: NEGATIVE
Staphylococcus aureus: NEGATIVE

## 2017-07-15 LAB — ABO/RH: ABO/RH(D): O POS

## 2017-07-15 SURGERY — FIXATION, FEMUR, NECK, PERCUTANEOUS, USING SCREW
Anesthesia: Spinal | Site: Hip | Laterality: Left

## 2017-07-15 MED ORDER — ALUM & MAG HYDROXIDE-SIMETH 200-200-20 MG/5ML PO SUSP: 30.0000 mL | ORAL | Status: DC | PRN

## 2017-07-15 MED ORDER — ONDANSETRON HCL 4 MG/2ML IJ SOLN
4.0000 mg | Freq: Four times a day (QID) | INTRAMUSCULAR | Status: DC | PRN
Start: 2017-07-15 — End: 2017-07-18

## 2017-07-15 MED ORDER — ALBUTEROL SULFATE (2.5 MG/3ML) 0.083% IN NEBU
2.5000 mg | INHALATION_SOLUTION | Freq: Two times a day (BID) | RESPIRATORY_TRACT | Status: DC
  Filled 2017-07-15: qty 3

## 2017-07-15 MED ORDER — FAMOTIDINE IN NACL 20-0.9 MG/50ML-% IV SOLN
20.0000 mg | INTRAVENOUS | Status: DC
  Administered 2017-07-15 – 2017-07-17 (×3): 20 mg via INTRAVENOUS
  Filled 2017-07-15 (×3): qty 50

## 2017-07-15 MED ORDER — NICOTINE 14 MG/24HR TD PT24
14.0000 mg | MEDICATED_PATCH | Freq: Every day | TRANSDERMAL | Status: DC
  Filled 2017-07-15 (×3): qty 1

## 2017-07-15 MED ORDER — MORPHINE SULFATE (PF) 2 MG/ML IV SOLN: 2.0000 mg | INTRAVENOUS | Status: DC | PRN

## 2017-07-15 MED ORDER — CELECOXIB 400 MG PO CAPS
400.0000 mg | ORAL_CAPSULE | Freq: Once | ORAL | Status: AC
  Administered 2017-07-15: 400 mg via ORAL
  Filled 2017-07-15: qty 1

## 2017-07-15 MED ORDER — METHOCARBAMOL 1000 MG/10ML IJ SOLN
500.0000 mg | Freq: Once | INTRAVENOUS | Status: AC
  Administered 2017-07-15: 500 mg via INTRAVENOUS
  Filled 2017-07-15: qty 5

## 2017-07-15 MED ORDER — MOMETASONE FURO-FORMOTEROL FUM 200-5 MCG/ACT IN AERO
2.0000 | INHALATION_SPRAY | Freq: Two times a day (BID) | RESPIRATORY_TRACT | Status: DC
  Administered 2017-07-15 – 2017-07-18 (×6): 2 via RESPIRATORY_TRACT
  Filled 2017-07-15: qty 8.8

## 2017-07-15 MED ORDER — OXYCODONE HCL 5 MG PO TABS
5.0000 mg | ORAL_TABLET | Freq: Once | ORAL | Status: AC
  Administered 2017-07-15: 5 mg via ORAL
  Filled 2017-07-15: qty 1

## 2017-07-15 MED ORDER — METOCLOPRAMIDE HCL 10 MG PO TABS: 5.0000 mg | ORAL_TABLET | Freq: Three times a day (TID) | ORAL | Status: DC | PRN

## 2017-07-15 MED ORDER — DOXYCYCLINE HYCLATE 100 MG PO TABS
100.0000 mg | ORAL_TABLET | Freq: Two times a day (BID) | ORAL | Status: DC
  Administered 2017-07-16 – 2017-07-17 (×3): 100 mg via ORAL
  Filled 2017-07-15 (×4): qty 1

## 2017-07-15 MED ORDER — FLUOXETINE HCL 20 MG PO CAPS
20.0000 mg | ORAL_CAPSULE | Freq: Every day | ORAL | Status: DC
  Administered 2017-07-16 – 2017-07-18 (×3): 20 mg via ORAL
  Filled 2017-07-15 (×3): qty 1

## 2017-07-15 MED ORDER — MORPHINE SULFATE (PF) 2 MG/ML IV SOLN
0.5000 mg | INTRAVENOUS | Status: DC | PRN
  Administered 2017-07-15 (×5): 1 mg via INTRAVENOUS
  Filled 2017-07-15 (×5): qty 1

## 2017-07-15 MED ORDER — SODIUM CHLORIDE 0.9 % IR SOLN
Status: DC | PRN
  Administered 2017-07-15: 1000 mL

## 2017-07-15 MED ORDER — LACTATED RINGERS IV SOLN
INTRAVENOUS | Status: DC | PRN
  Administered 2017-07-15: 17:00:00 via INTRAVENOUS

## 2017-07-15 MED ORDER — PANTOPRAZOLE SODIUM 40 MG PO TBEC
40.0000 mg | DELAYED_RELEASE_TABLET | Freq: Every day | ORAL | Status: DC
  Administered 2017-07-16 – 2017-07-18 (×3): 40 mg via ORAL
  Filled 2017-07-15 (×3): qty 1

## 2017-07-15 MED ORDER — PROPOFOL 10 MG/ML IV BOLUS
INTRAVENOUS | Status: AC
  Filled 2017-07-15: qty 40

## 2017-07-15 MED ORDER — ALBUTEROL SULFATE (2.5 MG/3ML) 0.083% IN NEBU
2.5000 mg | INHALATION_SOLUTION | Freq: Four times a day (QID) | RESPIRATORY_TRACT | Status: DC
  Administered 2017-07-15 (×3): 2.5 mg via RESPIRATORY_TRACT
  Filled 2017-07-15 (×3): qty 3

## 2017-07-15 MED ORDER — ONDANSETRON HCL 4 MG PO TABS: 4.0000 mg | ORAL_TABLET | Freq: Four times a day (QID) | ORAL | Status: DC | PRN

## 2017-07-15 MED ORDER — BUPIVACAINE HCL (PF) 0.75 % IJ SOLN
INTRAMUSCULAR | Status: DC | PRN
  Administered 2017-07-15: 13 mg via INTRATHECAL

## 2017-07-15 MED ORDER — CEFAZOLIN SODIUM-DEXTROSE 2-4 GM/100ML-% IV SOLN
2.0000 g | Freq: Four times a day (QID) | INTRAVENOUS | Status: AC
  Administered 2017-07-15 – 2017-07-16 (×2): 2 g via INTRAVENOUS
  Filled 2017-07-15 (×2): qty 100

## 2017-07-15 MED ORDER — METHOCARBAMOL 1000 MG/10ML IJ SOLN
INTRAMUSCULAR | Status: AC
  Filled 2017-07-15: qty 10

## 2017-07-15 MED ORDER — BUPIVACAINE IN DEXTROSE 0.75-8.25 % IT SOLN
INTRATHECAL | Status: AC
  Filled 2017-07-15: qty 2

## 2017-07-15 MED ORDER — FENTANYL CITRATE (PF) 100 MCG/2ML IJ SOLN
INTRAMUSCULAR | Status: DC | PRN
  Administered 2017-07-15 (×2): 25 ug via INTRAVENOUS
  Administered 2017-07-15: 25 ug via INTRATHECAL
  Administered 2017-07-15 (×2): 25 ug via INTRAVENOUS

## 2017-07-15 MED ORDER — PHENYLEPHRINE 40 MCG/ML (10ML) SYRINGE FOR IV PUSH (FOR BLOOD PRESSURE SUPPORT)
PREFILLED_SYRINGE | INTRAVENOUS | Status: AC
  Filled 2017-07-15: qty 10

## 2017-07-15 MED ORDER — POVIDONE-IODINE 10 % EX SWAB: 2.0000 "application " | Freq: Once | CUTANEOUS | Status: DC

## 2017-07-15 MED ORDER — PHENOL 1.4 % MT LIQD: 1.0000 | OROMUCOSAL | Status: DC | PRN

## 2017-07-15 MED ORDER — ALBUTEROL SULFATE (2.5 MG/3ML) 0.083% IN NEBU
INHALATION_SOLUTION | RESPIRATORY_TRACT | Status: AC
  Filled 2017-07-15: qty 3

## 2017-07-15 MED ORDER — TIOTROPIUM BROMIDE MONOHYDRATE 18 MCG IN CAPS
18.0000 ug | ORAL_CAPSULE | Freq: Every day | RESPIRATORY_TRACT | Status: DC
  Administered 2017-07-15 – 2017-07-18 (×4): 18 ug via RESPIRATORY_TRACT
  Filled 2017-07-15: qty 5

## 2017-07-15 MED ORDER — EPHEDRINE SULFATE 50 MG/ML IJ SOLN
INTRAMUSCULAR | Status: DC | PRN
  Administered 2017-07-15: 10 mg via INTRAVENOUS

## 2017-07-15 MED ORDER — SODIUM CHLORIDE 0.9 % IV SOLN
Freq: Once | INTRAVENOUS | Status: AC
  Administered 2017-07-15: 11:00:00 via INTRAVENOUS

## 2017-07-15 MED ORDER — FENTANYL CITRATE (PF) 100 MCG/2ML IJ SOLN
INTRAMUSCULAR | Status: AC
  Filled 2017-07-15: qty 2

## 2017-07-15 MED ORDER — OXYCODONE HCL 5 MG PO TABS
5.0000 mg | ORAL_TABLET | ORAL | Status: DC | PRN
  Administered 2017-07-15 – 2017-07-16 (×4): 10 mg via ORAL
  Administered 2017-07-16: 5 mg via ORAL
  Administered 2017-07-16 – 2017-07-17 (×7): 10 mg via ORAL
  Administered 2017-07-17: 5 mg via ORAL
  Administered 2017-07-18 (×4): 10 mg via ORAL
  Filled 2017-07-15 (×17): qty 2

## 2017-07-15 MED ORDER — MENTHOL 3 MG MT LOZG: 1.0000 | LOZENGE | OROMUCOSAL | Status: DC | PRN

## 2017-07-15 MED ORDER — METHOCARBAMOL 1000 MG/10ML IJ SOLN
500.0000 mg | Freq: Once | INTRAVENOUS | Status: DC
  Filled 2017-07-15: qty 5

## 2017-07-15 MED ORDER — POTASSIUM CHLORIDE IN NACL 20-0.9 MEQ/L-% IV SOLN
INTRAVENOUS | Status: AC
  Administered 2017-07-15: 03:00:00 via INTRAVENOUS

## 2017-07-15 MED ORDER — SENNOSIDES-DOCUSATE SODIUM 8.6-50 MG PO TABS: 1.0000 | ORAL_TABLET | Freq: Every evening | ORAL | Status: DC | PRN

## 2017-07-15 MED ORDER — BUPIVACAINE-EPINEPHRINE (PF) 0.5% -1:200000 IJ SOLN
INTRAMUSCULAR | Status: AC
  Filled 2017-07-15: qty 60

## 2017-07-15 MED ORDER — PHENYLEPHRINE HCL 10 MG/ML IJ SOLN
INTRAMUSCULAR | Status: DC | PRN
  Administered 2017-07-15: 80 ug via INTRAVENOUS
  Administered 2017-07-15: 40 ug via INTRAVENOUS
  Administered 2017-07-15: 80 ug via INTRAVENOUS

## 2017-07-15 MED ORDER — CEFAZOLIN SODIUM-DEXTROSE 2-4 GM/100ML-% IV SOLN
INTRAVENOUS | Status: AC
  Filled 2017-07-15: qty 200

## 2017-07-15 MED ORDER — METOCLOPRAMIDE HCL 5 MG/ML IJ SOLN: 5.0000 mg | Freq: Three times a day (TID) | INTRAMUSCULAR | Status: DC | PRN

## 2017-07-15 MED ORDER — POTASSIUM CHLORIDE IN NACL 20-0.9 MEQ/L-% IV SOLN
INTRAVENOUS | Status: AC
  Administered 2017-07-15: 19:00:00 via INTRAVENOUS

## 2017-07-15 MED ORDER — TRAZODONE HCL 50 MG PO TABS
50.0000 mg | ORAL_TABLET | Freq: Every day | ORAL | Status: DC
  Administered 2017-07-16: 50 mg via ORAL
  Filled 2017-07-15 (×3): qty 1

## 2017-07-15 MED ORDER — CHLORHEXIDINE GLUCONATE 4 % EX LIQD: 60.0000 mL | Freq: Once | CUTANEOUS | Status: DC

## 2017-07-15 MED ORDER — CEFAZOLIN SODIUM-DEXTROSE 2-3 GM-% IV SOLR: INTRAVENOUS | Status: DC | PRN

## 2017-07-15 MED ORDER — ONDANSETRON HCL 4 MG/2ML IJ SOLN
4.0000 mg | Freq: Once | INTRAMUSCULAR | Status: AC
  Administered 2017-07-15: 4 mg via INTRAVENOUS
  Filled 2017-07-15: qty 2

## 2017-07-15 MED ORDER — SODIUM CHLORIDE 0.9 % IV SOLN
INTRAVENOUS | Status: DC | PRN
  Administered 2017-07-15: 15:00:00 via INTRAVENOUS

## 2017-07-15 MED ORDER — MIDAZOLAM HCL 2 MG/2ML IJ SOLN
INTRAMUSCULAR | Status: AC
  Filled 2017-07-15: qty 2

## 2017-07-15 MED ORDER — BUPIVACAINE-EPINEPHRINE (PF) 0.5% -1:200000 IJ SOLN
INTRAMUSCULAR | Status: DC | PRN
  Administered 2017-07-15: 60 mL via PERINEURAL

## 2017-07-15 MED ORDER — HYDRALAZINE HCL 20 MG/ML IJ SOLN: 10.0000 mg | INTRAMUSCULAR | Status: DC | PRN

## 2017-07-15 MED ORDER — MIDAZOLAM HCL 5 MG/5ML IJ SOLN
INTRAMUSCULAR | Status: DC | PRN
  Administered 2017-07-15: 2 mg via INTRAVENOUS

## 2017-07-15 MED ORDER — CEFAZOLIN SODIUM-DEXTROSE 2-4 GM/100ML-% IV SOLN
2.0000 g | INTRAVENOUS | Status: DC
  Filled 2017-07-15 (×2): qty 100

## 2017-07-15 MED ORDER — CEFAZOLIN SODIUM-DEXTROSE 2-3 GM-% IV SOLR
INTRAVENOUS | Status: DC | PRN
  Administered 2017-07-15: 2 g via INTRAVENOUS

## 2017-07-15 MED ORDER — PROPOFOL 500 MG/50ML IV EMUL
INTRAVENOUS | Status: DC | PRN
  Administered 2017-07-15: 16:00:00 via INTRAVENOUS
  Administered 2017-07-15: 75 ug/kg/min via INTRAVENOUS

## 2017-07-15 MED ORDER — GABAPENTIN 100 MG PO CAPS
100.0000 mg | ORAL_CAPSULE | Freq: Three times a day (TID) | ORAL | Status: DC
  Administered 2017-07-15 – 2017-07-18 (×8): 100 mg via ORAL
  Filled 2017-07-15 (×9): qty 1

## 2017-07-15 MED ORDER — ASPIRIN EC 325 MG PO TBEC
325.0000 mg | DELAYED_RELEASE_TABLET | Freq: Every day | ORAL | Status: DC
  Administered 2017-07-16 – 2017-07-18 (×3): 325 mg via ORAL
  Filled 2017-07-15 (×3): qty 1

## 2017-07-15 SURGICAL SUPPLY — 54 items
BAG HAMPER (MISCELLANEOUS) ×3 IMPLANT
BIT DRILL 5 ACE CANN QC (BIT) ×3 IMPLANT
BLADE HEX COATED 2.75 (ELECTRODE) ×3 IMPLANT
BNDG GAUZE ELAST 4 BULKY (GAUZE/BANDAGES/DRESSINGS) ×3 IMPLANT
CHLORAPREP W/TINT 26ML (MISCELLANEOUS) ×3 IMPLANT
CLOTH BEACON ORANGE TIMEOUT ST (SAFETY) ×3 IMPLANT
COVER LIGHT HANDLE STERIS (MISCELLANEOUS) ×12 IMPLANT
COVER MAYO STAND XLG (DRAPE) ×3 IMPLANT
DECANTER SPIKE VIAL GLASS SM (MISCELLANEOUS) ×6 IMPLANT
DRAPE STERI IOBAN 125X83 (DRAPES) ×3 IMPLANT
DRESSING ALLEVYN BORDER 5X5 (GAUZE/BANDAGES/DRESSINGS) ×3 IMPLANT
DRESSING MEPILEX BORDER 6X8 (GAUZE/BANDAGES/DRESSINGS) ×1 IMPLANT
DRSG MEPILEX BORDER 6X8 (GAUZE/BANDAGES/DRESSINGS) ×3
GLOVE BIOGEL PI IND STRL 6.5 (GLOVE) ×2 IMPLANT
GLOVE BIOGEL PI IND STRL 7.0 (GLOVE) ×1 IMPLANT
GLOVE BIOGEL PI INDICATOR 6.5 (GLOVE) ×4
GLOVE BIOGEL PI INDICATOR 7.0 (GLOVE) ×2
GLOVE SKINSENSE NS SZ8.0 LF (GLOVE) ×2
GLOVE SKINSENSE STRL SZ8.0 LF (GLOVE) ×1 IMPLANT
GLOVE SS N UNI LF 8.5 STRL (GLOVE) ×3 IMPLANT
GOWN STRL REUS W/TWL LRG LVL3 (GOWN DISPOSABLE) ×12 IMPLANT
GOWN STRL REUS W/TWL XL LVL3 (GOWN DISPOSABLE) ×3 IMPLANT
INST SET MAJOR BONE (KITS) ×3 IMPLANT
KIT BLADEGUARD II DBL (SET/KITS/TRAYS/PACK) ×3 IMPLANT
KIT ROOM TURNOVER APOR (KITS) ×3 IMPLANT
MANIFOLD NEPTUNE II (INSTRUMENTS) ×3 IMPLANT
MARKER SKIN DUAL TIP RULER LAB (MISCELLANEOUS) ×3 IMPLANT
NEEDLE HYPO 21X1.5 SAFETY (NEEDLE) ×3 IMPLANT
NEEDLE SPNL 18GX3.5 QUINCKE PK (NEEDLE) ×3 IMPLANT
NS IRRIG 1000ML POUR BTL (IV SOLUTION) ×3 IMPLANT
PACK BASIC III (CUSTOM PROCEDURE TRAY) ×2
PACK SRG BSC III STRL LF ECLPS (CUSTOM PROCEDURE TRAY) ×1 IMPLANT
PAD ABD 5X9 TENDERSORB (GAUZE/BANDAGES/DRESSINGS) ×3 IMPLANT
PENCIL HANDSWITCHING (ELECTRODE) ×3 IMPLANT
PIN THREADED GUIDE ACE (PIN) ×9 IMPLANT
SCREW CANN 6.5 75MM (Screw) ×4 IMPLANT
SCREW CANN 6.5 80MM (Screw) ×2 IMPLANT
SCREW CANN 6.5 85MM (Screw) IMPLANT
SCREW CANN LG 6.5 FLT 75X22 (Screw) ×2 IMPLANT
SCREW CANN LG 6.5 FLT 80X22 (Screw) ×1 IMPLANT
SCREW CANN LG 6.5 FLT 85X22 (Screw) IMPLANT
SET BASIN LINEN APH (SET/KITS/TRAYS/PACK) ×3 IMPLANT
SPONGE LAP 18X18 X RAY DECT (DISPOSABLE) ×3 IMPLANT
STAPLER VISISTAT 35W (STAPLE) ×3 IMPLANT
SUT BRALON NAB BRD #1 30IN (SUTURE) IMPLANT
SUT MNCRL 0 VIOLET CTX 36 (SUTURE) ×1 IMPLANT
SUT MON AB 2-0 CT1 36 (SUTURE) ×3 IMPLANT
SUT MONOCRYL 0 CTX 36 (SUTURE) ×2
SYR 30ML LL (SYRINGE) ×3 IMPLANT
SYR BULB IRRIGATION 50ML (SYRINGE) ×6 IMPLANT
TOWEL OR 17X26 4PK STRL BLUE (TOWEL DISPOSABLE) ×3 IMPLANT
TRAY FOLEY W/METER SILVER 16FR (SET/KITS/TRAYS/PACK) ×3 IMPLANT
WASHER ACECAN 6.5 (Washer) ×9 IMPLANT
YANKAUER SUCT BULB TIP 10FT TU (MISCELLANEOUS) ×3 IMPLANT

## 2017-07-15 NOTE — Op Note (Signed)
07/15/2017  5:41 PM  PATIENT:  Amy Rubio  66 y.o. female  PRE-OPERATIVE DIAGNOSIS:  left femoral neck fracture  POST-OPERATIVE DIAGNOSIS:  left femoral neck fracture  PROCEDURE:  Procedure(s): CANNULATED HIP PINNING (Left)  SURGEON:  Surgeon(s) and Role:    Amy Civil, MD - Primary  PHYSICIAN ASSISTANT:   ASSISTANTS: none   ANESTHESIA:   epidural and spinal  EBL:  Total I/O In: 1000 [I.V.:1000] Out: 675 [Urine:650; Blood:25]  BLOOD ADMINISTERED:none  DRAINS: none   LOCAL MEDICATIONS USED:  MARCAINE     SPECIMEN:  No Specimen  DISPOSITION OF SPECIMEN:  N/A  COUNTS:  YES  TOURNIQUET:  * No tourniquets in log *  DICTATION: Amy Rubio Dictation  Surgical dictation  This patient was identified as Amy Rubio in the preop area dATE OF BIRTH Oct 12, 1950  cHART REVIEW SITE CONFIRMATION MARKING WAS PERFORMED. tHE PATIENT WAS TAKEN TO THE OPERATING ROOM SHE WAS GIVEN aNCEF 2 G FOLLOWED BY SPINAL ANESTHETIC. She was then placed on the fracture table.  The C-arm was brought in and the imaging confirmed a stable reduction.  The leg was then prepped and draped sterilely in the timeout procedure was completed.  A lateral incision was made just inferior to the greater trochanter the subcutaneous tissue was divided down to the fascia the fascia was split in line with the incision and the vastus lateralis musculature was incised with cautery and blunt dissection was carried down to bone exposing the femur  3 guidewires were placed in the femoral neck and head confirmed to be in good position and inverted triangle position on AP and lateral x-ray  Each pin was measured and drilled outer cortex only and then a screw was passed over the guidewire with a washer. X-rays confirmed position of the screws and reduction of the fracture  The wound was irrigated with saline and closed with 0 Monocryl #1 Bralon on and 2-0 Monocryl. We injected 60 mL of Marcaine with  epinephrine  Sterile dressing was applied  Postop plan weightbearing as tolerated Staples out postop day 14 Follow-up x-rays postop day 14-17 DVT prevention with aspirin for 28 days No hip precautions are needed    PLAN OF CARE: Admit to inpatient   PATIENT DISPOSITION:  PACU - hemodynamically stable.   Delay start of Pharmacological VTE agent (>24hrs) due to surgical blood loss or risk of bleeding: yes  [id number]

## 2017-07-15 NOTE — Anesthesia Procedure Notes (Signed)
Procedure Name: MAC Date/Time: 07/15/2017 3:03 PM Performed by: Andree Elk, Tyonna Talerico A Pre-anesthesia Checklist: Patient identified, Emergency Drugs available, Suction available and Patient being monitored

## 2017-07-15 NOTE — Transfer of Care (Signed)
Immediate Anesthesia Transfer of Care Note  Patient: Amy Rubio  Procedure(s) Performed: CANNULATED HIP PINNING (Left Hip)  Patient Location: PACU  Anesthesia Type:Spinal  Level of Consciousness: awake, alert , oriented and patient cooperative  Airway & Oxygen Therapy: Patient Spontanous Breathing and Patient connected to face mask oxygen  Post-op Assessment: Report given to RN and Post -op Vital signs reviewed and stable  Post vital signs: Reviewed and stable  Last Vitals:  Vitals:   07/15/17 0816 07/15/17 1148  BP:    Pulse:    Resp:    Temp:    SpO2: (!) 80% 92%    Last Pain:  Vitals:   07/15/17 1309  TempSrc:   PainSc: 7          Complications: No apparent anesthesia complications
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
## 2017-07-15 NOTE — Anesthesia Procedure Notes (Signed)
Spinal  Patient location during procedure: OR Start time: 07/15/2017 3:40 PM Staffing Resident/CRNA: Khaila Velarde A Preanesthetic Checklist Completed: patient identified, site marked, surgical consent, pre-op evaluation, timeout performed, IV checked, risks and benefits discussed and monitors and equipment checked Spinal Block Patient position: left lateral decubitus Prep: Betadine Patient monitoring: heart rate, cardiac monitor, continuous pulse ox and blood pressure Approach: left paramedian Location: L3-4 Injection technique: single-shot Needle Needle type: Spinocan  Needle gauge: 22 G Needle length: 9 cm Assessment Sensory level: T8 Additional Notes  ATTEMPTS:1 TRAY HU:[id number] TRAY EXPIRATION DATE:08/02/19

## 2017-07-15 NOTE — Anesthesia Postprocedure Evaluation (Signed)
Anesthesia Post Note  Patient: Amy Rubio  Procedure(s) Performed: CANNULATED HIP PINNING (Left Hip)  Patient location during evaluation: PACU Anesthesia Type: Spinal Level of consciousness: awake and patient cooperative Pain management: pain level controlled Vital Signs Assessment: post-procedure vital signs reviewed and stable Respiratory status: spontaneous breathing, respiratory function stable and patient connected to nasal cannula oxygen Cardiovascular status: stable Postop Assessment: no apparent nausea or vomiting Anesthetic complications: no     Last Vitals:  Vitals:   07/15/17 1800 07/15/17 1815  BP: (!) 143/55 114/76  Pulse: 61 67  Resp: 13 13  Temp:    SpO2: 97% 100%    Last Pain:  Vitals:   07/15/17 1309  TempSrc:   PainSc: 7                  [name]

## 2017-07-15 NOTE — Brief Op Note (Signed)
07/14/2017 - 07/15/2017  5:41 PM  PATIENT:  Amy Rubio  66 y.o. female  PRE-OPERATIVE DIAGNOSIS:  left femoral neck fracture  POST-OPERATIVE DIAGNOSIS:  left femoral neck fracture  PROCEDURE:  Procedure(s): CANNULATED HIP PINNING (Left)  SURGEON:  Surgeon(s) and Role:    Carole Civil, MD - Primary  PHYSICIAN ASSISTANT:   ASSISTANTS: none   ANESTHESIA:   epidural and spinal  EBL:  Total I/O In: 1000 [I.V.:1000] Out: 675 [Urine:650; Blood:25]  BLOOD ADMINISTERED:none  DRAINS: none   LOCAL MEDICATIONS USED:  MARCAINE     SPECIMEN:  No Specimen  DISPOSITION OF SPECIMEN:  N/A  COUNTS:  YES  TOURNIQUET:  * No tourniquets in log *  DICTATION: .Dragon Dictation  PLAN OF CARE: Admit to inpatient   PATIENT DISPOSITION:  PACU - hemodynamically stable.   Delay start of Pharmacological VTE agent (>24hrs) due to surgical blood loss or risk of bleeding: yes  [id number]
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
## 2017-07-16 ENCOUNTER — Encounter (HOSPITAL_COMMUNITY): Payer: Self-pay | Admitting: Orthopedic Surgery

## 2017-07-16 DIAGNOSIS — S72002D Fracture of unspecified part of neck of left femur, subsequent encounter for closed fracture with routine healing: Secondary | ICD-10-CM

## 2017-07-16 LAB — CBC
HEMATOCRIT: 32.6 % — AB (ref 36.0–46.0)
HEMOGLOBIN: 10.7 g/dL — AB (ref 12.0–15.0)
MCH: 32.7 pg (ref 26.0–34.0)
MCHC: 32.8 g/dL (ref 30.0–36.0)
MCV: 99.7 fL (ref 78.0–100.0)
Platelets: 186 10*3/uL (ref 150–400)
RBC: 3.27 MIL/uL — AB (ref 3.87–5.11)
RDW: 15.7 % — ABNORMAL HIGH (ref 11.5–15.5)
WBC: 7.1 10*3/uL (ref 4.0–10.5)

## 2017-07-16 LAB — BASIC METABOLIC PANEL
ANION GAP: 7 (ref 5–15)
BUN: 6 mg/dL (ref 6–20)
CHLORIDE: 100 mmol/L — AB (ref 101–111)
CO2: 25 mmol/L (ref 22–32)
CREATININE: 0.62 mg/dL (ref 0.44–1.00)
Calcium: 8.3 mg/dL — ABNORMAL LOW (ref 8.9–10.3)
GFR calc non Af Amer: >60 mL/min (ref >60)
Glucose, Bld: 131 mg/dL — ABNORMAL HIGH (ref 65–99)
POTASSIUM: 3.6 mmol/L (ref 3.5–5.1)
SODIUM: 132 mmol/L — AB (ref 135–145)

## 2017-07-16 MED ORDER — ENSURE ENLIVE PO LIQD
237.0000 mL | Freq: Two times a day (BID) | ORAL | Status: DC
  Administered 2017-07-16: 237 mL via ORAL

## 2017-07-16 MED ORDER — MORPHINE SULFATE (PF) 2 MG/ML IV SOLN: 1.0000 mg | Freq: Four times a day (QID) | INTRAVENOUS | Status: DC | PRN

## 2017-07-16 MED ORDER — SENNOSIDES-DOCUSATE SODIUM 8.6-50 MG PO TABS
1.0000 | ORAL_TABLET | Freq: Every day | ORAL | Status: DC
  Administered 2017-07-16 – 2017-07-17 (×2): 1 via ORAL
  Filled 2017-07-16 (×2): qty 1

## 2017-07-16 MED ORDER — ALBUTEROL SULFATE (2.5 MG/3ML) 0.083% IN NEBU: 2.5000 mg | INHALATION_SOLUTION | RESPIRATORY_TRACT | Status: DC | PRN

## 2017-07-16 MED ORDER — METHOCARBAMOL 500 MG PO TABS
500.0000 mg | ORAL_TABLET | Freq: Three times a day (TID) | ORAL | Status: DC | PRN
  Administered 2017-07-17 – 2017-07-18 (×2): 500 mg via ORAL
  Filled 2017-07-16 (×2): qty 1

## 2017-07-16 MED ORDER — ADULT MULTIVITAMIN W/MINERALS CH
1.0000 | ORAL_TABLET | Freq: Every day | ORAL | Status: DC
  Administered 2017-07-16 – 2017-07-18 (×3): 1 via ORAL
  Filled 2017-07-16 (×3): qty 1

## 2017-07-16 NOTE — Anesthesia Postprocedure Evaluation (Signed)
Anesthesia Post Note  Patient: Amy Rubio  Procedure(s) Performed: CANNULATED HIP PINNING (Left Hip)  Patient location during evaluation: Nursing Unit Anesthesia Type: Spinal Level of consciousness: awake and alert and oriented Pain management: pain level controlled Vital Signs Assessment: post-procedure vital signs reviewed and stable Respiratory status: spontaneous breathing, respiratory function stable and patient connected to nasal cannula oxygen (Patient does have rhonchi; aggressive pulmonary toilet) Cardiovascular status: stable Postop Assessment: no apparent nausea or vomiting Anesthetic complications: no     Last Vitals:  Vitals:   07/16/17 0939 07/16/17 1248  BP:  118/62  Pulse:  74  Resp:  18  Temp:  36.9 C  SpO2: 96% 98%    Last Pain:  Vitals:   07/16/17 1333  TempSrc:   PainSc: 4                  [name], AMY A

## 2017-07-16 NOTE — NC FL2 (Signed)
[place] LEVEL OF CARE SCREENING TOOL     IDENTIFICATION  Patient Name: Amy Rubio Birthdate: 02-Feb-1951 Sex: female Admission Date (Current Location): 07/14/2017  [facility] and [place] Number:  [employer] and Address:  [place] [address] [place]      Provider Number: [id number]  Attending Physician Name and Address:  Louellen Molder, MD  Relative Name and Phone Number:       Current Level of Care: Hospital Recommended Level of Care: [place] Prior Approval Number:    Date Approved/Denied:   PASRR Number:    Discharge Plan: SNF    Current Diagnoses: Patient Active Problem List   Diagnosis Date Noted  . Hypokalemia 07/15/2017  . Hyponatremia 07/15/2017  . Closed left hip fracture, initial encounter ([place]) 07/15/2017  . Anxiety 07/15/2017  . COPD with acute exacerbation ([place]) 12/13/2015  . Visual field defect 12/13/2015  . Senile purpura ([place]) 06/14/2015  . Hyperlipidemia 10/30/2013  . Osteoporosis 07/31/2013  . Essential hypertension, benign 07/31/2013  . Osteoarthritis 07/31/2013  . Chronic pain syndrome 04/24/2013  . Esophageal dysphagia 10/30/2011  . FH: colon cancer 10/30/2011  . Abnormal CT scan, esophagus 10/30/2011  . ESOPHAGEAL STRICTURE 08/24/2009  . COLONIC POLYPS, ADENOMATOUS, HX OF 08/24/2009  . CIGARETTE SMOKER 08/18/2009  . COPD exacerbation ([place]) 08/18/2009  . WEIGHT LOSS, RECENT 08/18/2009  . CHEST PAIN UNSPECIFIED 08/18/2009    Orientation RESPIRATION BLADDER Height & Weight     Self, Time, Situation, Place  O2 (2L) Continent Weight: 111 lb (50.3 kg) Height:  5\' 5"  (165.1 cm)  BEHAVIORAL SYMPTOMS/MOOD NEUROLOGICAL BOWEL NUTRITION STATUS      Continent Diet (Regular)  AMBULATORY STATUS COMMUNICATION OF NEEDS Skin   Limited Assist Verbally Surgical wounds (left hip)                       Personal Care Assistance Level of Assistance  Bathing, Feeding,  Dressing Bathing Assistance: Limited assistance Feeding assistance: Independent Dressing Assistance: Limited assistance     Functional Limitations Info  Sight, Hearing, Speech Sight Info: Adequate Hearing Info: Adequate Speech Info: Adequate    SPECIAL CARE FACTORS FREQUENCY  PT (By licensed PT)     PT Frequency: 5x/week              Contractures Contractures Info: Not present    Additional Factors Info  Code Status, Psychotropic Code Status Info: Full code   Psychotropic Info: Desyrel, Prozac         Current Medications (07/16/2017):  This is the current hospital active medication list Current Facility-Administered Medications  Medication Dose Route Frequency Provider Last Rate Last Dose  . albuterol (PROVENTIL) (2.5 MG/3ML) 0.083% nebulizer solution 2.5 mg  2.5 mg Nebulization Q4H PRN Dhungel, Nishant, MD      . alum & mag hydroxide-simeth (MAALOX/MYLANTA) 200-200-20 MG/5ML suspension 30 mL  30 mL Oral Q4H PRN Carole Civil, MD      . aspirin EC tablet 325 mg  325 mg Oral Q breakfast Carole Civil, MD   325 mg at 07/16/17 0947  . doxycycline (VIBRA-TABS) tablet 100 mg  100 mg Oral BID Vianne Bulls, MD   100 mg at 07/16/17 0953  . famotidine (PEPCID) IVPB 20 mg premix  20 mg Intravenous Q24H Opyd, Ilene Qua, MD   Stopped at 07/16/17 0220  . feeding supplement (ENSURE ENLIVE) (ENSURE ENLIVE) liquid 237 mL  237 mL Oral BID  BM Dhungel, Nishant, MD      . FLUoxetine (PROZAC) capsule 20 mg  20 mg Oral Daily Opyd, Ilene Qua, MD   20 mg at 07/16/17 0946  . gabapentin (NEURONTIN) capsule 100 mg  100 mg Oral TID Vianne Bulls, MD   100 mg at 07/16/17 0946  . hydrALAZINE (APRESOLINE) injection 10 mg  10 mg Intravenous Q4H PRN Opyd, Ilene Qua, MD      . menthol-cetylpyridinium (CEPACOL) lozenge 3 mg  1 lozenge Oral PRN Carole Civil, MD       Or  . phenol (CHLORASEPTIC) mouth spray 1 spray  1 spray Mouth/Throat PRN Carole Civil, MD      .  methocarbamol (ROBAXIN) tablet 500 mg  500 mg Oral Q8H PRN Dhungel, Nishant, MD      . metoCLOPramide (REGLAN) tablet 5-10 mg  5-10 mg Oral Q8H PRN Carole Civil, MD       Or  . metoCLOPramide (REGLAN) injection 5-10 mg  5-10 mg Intravenous Q8H PRN Carole Civil, MD      . mometasone-formoterol (DULERA) 200-5 MCG/ACT inhaler 2 puff  2 puff Inhalation BID Opyd, Ilene Qua, MD   2 puff at 07/15/17 1956  . morphine 2 MG/ML injection 1 mg  1 mg Intravenous Q6H PRN Dhungel, Nishant, MD      . multivitamin with minerals tablet 1 tablet  1 tablet Oral Daily Dhungel, Nishant, MD      . nicotine (NICODERM CQ - dosed in mg/24 hours) patch 14 mg  14 mg Transdermal Daily Dhungel, Nishant, MD      . ondansetron (ZOFRAN) tablet 4 mg  4 mg Oral Q6H PRN Carole Civil, MD       Or  . ondansetron [facility]) injection 4 mg  4 mg Intravenous Q6H PRN Carole Civil, MD      . oxyCODONE (Oxy IR/ROXICODONE) immediate release tablet 5-10 mg  5-10 mg Oral Q4H PRN Carole Civil, MD   10 mg at 07/16/17 1233  . pantoprazole (PROTONIX) EC tablet 40 mg  40 mg Oral Daily Opyd, Ilene Qua, MD   40 mg at 07/16/17 0953  . senna-docusate (Senokot-S) tablet 1 tablet  1 tablet Oral QHS Dhungel, Nishant, MD      . tiotropium (SPIRIVA) inhalation capsule 18 mcg  18 mcg Inhalation Daily Opyd, Ilene Qua, MD   18 mcg at 07/16/17 0938  . traZODone (DESYREL) tablet 50 mg  50 mg Oral QHS Opyd, Ilene Qua, MD         Discharge Medications: Please see discharge summary for a list of discharge medications.  Relevant Imaging Results:  Relevant Lab Results:   Additional Information SSN [id number]   Loris Seelye, Clydene Pugh, LCSW

## 2017-07-16 NOTE — Progress Notes (Signed)
Patient ID: MOZETTA MURFIN, female   DOB: [birth year], 66 y.o.   MRN: [id number] POD 1  ORIF LEFT HIP CANNULATED SCREWS   BP 127/61   Pulse 72   Temp 98.4 F (36.9 C)   Resp 13   Ht 5\' 5"  (1.651 m)   Wt 111 lb (50.3 kg)   SpO2 96%   BMI 18.47 kg/m   CBC Latest Ref Rng & Units 07/15/2017 07/15/2017 04/18/2016  WBC 4.0 - 10.5 K/uL 6.0 8.2 6.2  Hemoglobin 12.0 - 15.0 g/dL 11.5(L) 11.9(L) 12.7  Hematocrit 36.0 - 46.0 % 35.2(L) 36.9 37.8  Platelets 150 - 400 K/uL 211 226 275   BMP Latest Ref Rng & Units 07/15/2017 07/15/2017 01/22/2015  Glucose 65 - 99 mg/dL 106(H) 91 104(H)  BUN 6 - 20 mg/dL 7 8 7   Creatinine 0.44 - 1.00 mg/dL 0.65 0.65 0.70  Sodium 135 - 145 mmol/L 132(L) 131(L) 134(L)  Potassium 3.5 - 5.1 mmol/L 3.6 3.4(L) 3.7  Chloride 101 - 111 mmol/L 97(L) 94(L) 98  CO2 22 - 32 mmol/L 28 29 29   Calcium 8.9 - 10.3 mg/dL 8.5(L) 8.7(L) 8.7    SHE LOOKS GOOD. HER LUNGS ARE POOR  NEEDS AGGRESSIVE PULM TOILET   PT   WBAT STAPLES OUT pod 14-17 F/U DR Ashtian Villacis IN 2  WEEKS  DVT PROPHYLAXIS X 28 DAYS  WBAT
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
## 2017-07-16 NOTE — Clinical Social Work Placement (Signed)
   CLINICAL SOCIAL WORK PLACEMENT  NOTE  Date:  07/16/2017  Patient Details  Name: Amy Rubio MRN: [id number] Date of Birth: 11/16/1950  Clinical Social Work is seeking post-discharge placement for this patient at the [place] level of care (*CSW will initial, date and re-position this form in  chart as items are completed):  Yes   Patient/family provided with [place] Work Department's list of facilities offering this level of care within the geographic area requested by the patient (or if unable, by the patient's family).  Yes   Patient/family informed of their freedom to choose among providers that offer the needed level of care, that participate in Medicare, Medicaid or managed care program needed by the patient, have an available bed and are willing to accept the patient.  Yes   Patient/family informed of [place]'s ownership interest in [facility] and [facility], as well as of the fact that they are under no obligation to receive care at these facilities.  PASRR submitted to EDS on 07/16/17     PASRR number received on       Existing PASRR number confirmed on       FL2 transmitted to all facilities in geographic area requested by pt/family on 07/16/17     FL2 transmitted to all facilities within larger geographic area on       Patient informed that his/her managed care company has contracts with or will negotiate with certain facilities, including the following:            Patient/family informed of bed offers received.  Patient chooses bed at       Physician recommends and patient chooses bed at      Patient to be transferred to   on  .  Patient to be transferred to facility by       Patient family notified on   of transfer.  Name of family member notified:        PHYSICIAN       Additional Comment:    _______________________________________________ Ihor Gully, LCSW 07/16/2017, 3:56 PM
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
## 2017-07-16 NOTE — Clinical Social Work Note (Signed)
Clinical Social Work Assessment  Patient Details  Name: Amy Rubio MRN: [id number] Date of Birth: [birth year]  Date of referral:  07/16/17               Reason for consult:  Discharge Planning                Permission sought to share information with:    Permission granted to share information::     Name::        Agency::     Relationship::     Contact Information:     Housing/Transportation Living arrangements for the past 2 months:  Single Family Home Source of Information:  Patient Patient Interpreter Needed:  None Criminal Activity/Legal Involvement Pertinent to Current Situation/Hospitalization:  No - Comment as needed Significant Relationships:  Adult Children, Other Family Members, Community Support Lives with:  Other (Comment) (Patient provides care for her 65 year old granddaughter and 58 year old grandson) Do you feel safe going back to the place where you live?  Yes Need for family participation in patient care:  Yes (Comment)  Care giving concerns:  None identified. At baseline, patient is independent (drives, ambulates independently, etc).   Social Worker assessment / plan:  LCSW faxed clinicals to [facility], [place] at patient's request.  Employment status:  Retired Forensic scientist:  Medicare PT Recommendations:  [place] / Referral to community resources:  [place]  Patient/Family's Response to care:  Patient is reluctant but agreeable to SNF placement for short term rehab.   Patient/Family's Understanding of and Emotional Response to Diagnosis, Current Treatment, and Prognosis:  Patient understands her diagnosis, treatment and prognosis.   Emotional Assessment Appearance:  Appears stated age Attitude/Demeanor/Rapport:    Affect (typically observed):  Calm, Accepting Orientation:  Oriented to Self, Oriented to Place, Oriented to  Time, Oriented to Situation Alcohol / Substance use:  Tobacco  Use Psych involvement (Current and /or in the community):  No (Comment)  Discharge Needs  Concerns to be addressed:  Discharge Planning Concerns Readmission within the last 30 days:  No Current discharge risk:  None Barriers to Discharge:  No Barriers Identified   Ihor Gully, LCSW 07/16/2017, 3:59 PM

## 2017-07-16 NOTE — Addendum Note (Signed)
Addendum  created 07/16/17 1512 by Mickel Baas, CRNA   Sign clinical note

## 2017-07-17 LAB — BASIC METABOLIC PANEL
Anion gap: 9 (ref 5–15)
BUN: 7 mg/dL (ref 6–20)
CALCIUM: 8.4 mg/dL — AB (ref 8.9–10.3)
CHLORIDE: 100 mmol/L — AB (ref 101–111)
CO2: 27 mmol/L (ref 22–32)
CREATININE: 0.65 mg/dL (ref 0.44–1.00)
GFR calc Af Amer: >60 mL/min (ref >60)
GFR calc non Af Amer: >60 mL/min (ref >60)
Glucose, Bld: 107 mg/dL — ABNORMAL HIGH (ref 65–99)
Potassium: 3.7 mmol/L (ref 3.5–5.1)
Sodium: 136 mmol/L (ref 135–145)

## 2017-07-17 LAB — CBC
HEMATOCRIT: 30.6 % — AB (ref 36.0–46.0)
HEMOGLOBIN: 10 g/dL — AB (ref 12.0–15.0)
MCH: 32.4 pg (ref 26.0–34.0)
MCHC: 32.7 g/dL (ref 30.0–36.0)
MCV: 99 fL (ref 78.0–100.0)
Platelets: 180 10*3/uL (ref 150–400)
RBC: 3.09 MIL/uL — ABNORMAL LOW (ref 3.87–5.11)
RDW: 15.7 % — AB (ref 11.5–15.5)
WBC: 5.9 10*3/uL (ref 4.0–10.5)

## 2017-07-17 MED ORDER — LISINOPRIL 10 MG PO TABS
20.0000 mg | ORAL_TABLET | Freq: Every day | ORAL | Status: DC
  Administered 2017-07-17 – 2017-07-18 (×2): 20 mg via ORAL
  Filled 2017-07-17 (×2): qty 2
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
## 2017-07-17 NOTE — Addendum Note (Signed)
Addendum  created 07/17/17 1252 by Vista Deck, CRNA   Anesthesia Staff edited

## 2017-07-17 NOTE — Progress Notes (Signed)
Patient ID: Amy Rubio, female   DOB: 1951/05/18, 66 y.o.   MRN: [id number] POD 2   Chart review  BMP Latest Ref Rng & Units 07/17/2017 07/16/2017 07/15/2017  Glucose 65 - 99 mg/dL 107(H) 131(H) 106(H)  BUN 6 - 20 mg/dL 7 6 7   Creatinine 0.44 - 1.00 mg/dL 0.65 0.62 0.65  Sodium 135 - 145 mmol/L 136 132(L) 132(L)  Potassium 3.5 - 5.1 mmol/L 3.7 3.6 3.6  Chloride 101 - 111 mmol/L 100(L) 100(L) 97(L)  CO2 22 - 32 mmol/L 27 25 28   Calcium 8.9 - 10.3 mg/dL 8.4(L) 8.3(L) 8.5(L)   CBC Latest Ref Rng & Units 07/17/2017 07/16/2017 07/15/2017  WBC 4.0 - 10.5 K/uL 5.9 7.1 6.0  Hemoglobin 12.0 - 15.0 g/dL 10.0(L) 10.7(L) 11.5(L)  Hematocrit 36.0 - 46.0 % 30.6(L) 32.6(L) 35.2(L)  Platelets 150 - 400 K/uL 180 186 211
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
## 2017-07-18 ENCOUNTER — Inpatient Hospital Stay
Admission: RE | Admit: 2017-07-18 | Discharge: 2017-08-02 | Disposition: A | Discharge status: Home / Self Care | Payer: Medicare Other | Source: Ambulatory Visit | Attending: Internal Medicine | Admitting: Internal Medicine

## 2017-07-18 DIAGNOSIS — E876 Hypokalemia: Secondary | ICD-10-CM | POA: Diagnosis not present

## 2017-07-18 DIAGNOSIS — M810 Age-related osteoporosis without current pathological fracture: Secondary | ICD-10-CM | POA: Diagnosis not present

## 2017-07-18 DIAGNOSIS — F17200 Nicotine dependence, unspecified, uncomplicated: Secondary | ICD-10-CM | POA: Diagnosis not present

## 2017-07-18 DIAGNOSIS — R293 Abnormal posture: Secondary | ICD-10-CM | POA: Diagnosis not present

## 2017-07-18 DIAGNOSIS — G894 Chronic pain syndrome: Secondary | ICD-10-CM | POA: Diagnosis not present

## 2017-07-18 DIAGNOSIS — S72002A Fracture of unspecified part of neck of left femur, initial encounter for closed fracture: Secondary | ICD-10-CM | POA: Diagnosis not present

## 2017-07-18 DIAGNOSIS — J449 Chronic obstructive pulmonary disease, unspecified: Secondary | ICD-10-CM | POA: Diagnosis not present

## 2017-07-18 DIAGNOSIS — M6281 Muscle weakness (generalized): Secondary | ICD-10-CM | POA: Diagnosis not present

## 2017-07-18 DIAGNOSIS — F411 Generalized anxiety disorder: Secondary | ICD-10-CM | POA: Diagnosis not present

## 2017-07-18 DIAGNOSIS — E871 Hypo-osmolality and hyponatremia: Secondary | ICD-10-CM | POA: Diagnosis not present

## 2017-07-18 DIAGNOSIS — S72002D Fracture of unspecified part of neck of left femur, subsequent encounter for closed fracture with routine healing: Secondary | ICD-10-CM | POA: Diagnosis not present

## 2017-07-18 DIAGNOSIS — Z4789 Encounter for other orthopedic aftercare: Secondary | ICD-10-CM | POA: Diagnosis not present

## 2017-07-18 DIAGNOSIS — Z967 Presence of other bone and tendon implants: Secondary | ICD-10-CM | POA: Diagnosis not present

## 2017-07-18 DIAGNOSIS — G8929 Other chronic pain: Secondary | ICD-10-CM | POA: Diagnosis not present

## 2017-07-18 DIAGNOSIS — J441 Chronic obstructive pulmonary disease with (acute) exacerbation: Secondary | ICD-10-CM | POA: Diagnosis not present

## 2017-07-18 DIAGNOSIS — R2689 Other abnormalities of gait and mobility: Secondary | ICD-10-CM | POA: Diagnosis not present

## 2017-07-18 DIAGNOSIS — I10 Essential (primary) hypertension: Secondary | ICD-10-CM | POA: Diagnosis not present

## 2017-07-18 DIAGNOSIS — F419 Anxiety disorder, unspecified: Secondary | ICD-10-CM | POA: Diagnosis not present

## 2017-07-18 DIAGNOSIS — M8080XS Other osteoporosis with current pathological fracture, unspecified site, sequela: Secondary | ICD-10-CM | POA: Diagnosis not present

## 2017-07-18 DIAGNOSIS — Z8781 Personal history of (healed) traumatic fracture: Secondary | ICD-10-CM | POA: Diagnosis not present

## 2017-07-18 LAB — CBC
HCT: 30 % — ABNORMAL LOW (ref 36.0–46.0)
Hemoglobin: 9.7 g/dL — ABNORMAL LOW (ref 12.0–15.0)
MCH: 32 pg (ref 26.0–34.0)
MCHC: 32.3 g/dL (ref 30.0–36.0)
MCV: 99 fL (ref 78.0–100.0)
PLATELETS: 213 10*3/uL (ref 150–400)
RBC: 3.03 MIL/uL — ABNORMAL LOW (ref 3.87–5.11)
RDW: 15.6 % — AB (ref 11.5–15.5)
WBC: 6 10*3/uL (ref 4.0–10.5)

## 2017-07-18 MED ORDER — ASPIRIN 325 MG PO TBEC: 325.0000 mg | DELAYED_RELEASE_TABLET | Freq: Every day | ORAL | 0 refills | Status: DC

## 2017-07-18 MED ORDER — OXYCODONE HCL 5 MG PO TABS: 5.0000 mg | ORAL_TABLET | ORAL | 0 refills | Status: DC | PRN

## 2017-07-18 MED ORDER — SENNOSIDES-DOCUSATE SODIUM 8.6-50 MG PO TABS: 1.0000 | ORAL_TABLET | Freq: Every day | ORAL | Status: DC

## 2017-07-18 MED ORDER — LISINOPRIL 20 MG PO TABS: 20.0000 mg | ORAL_TABLET | Freq: Every day | ORAL | Status: DC

## 2017-07-18 MED ORDER — METHOCARBAMOL 500 MG PO TABS: 500.0000 mg | ORAL_TABLET | Freq: Three times a day (TID) | ORAL | Status: DC | PRN

## 2017-07-18 NOTE — Care Management Important Message (Signed)
Important Message  Patient Details  Name: Amy Rubio MRN: [id number] Date of Birth: 30-Jan-1951   Medicare Important Message Given:  Yes    Sherald Barge, RN 07/18/2017, 10:52 AM

## 2017-07-18 NOTE — Progress Notes (Signed)
Pt discharged to [facility] today per Dr. Roderic Palau. Pt's IV site D/C'd and WDL. Pt's VSS. Report called to nurse at [facility]. Verbalized understanding. Pt left floor via WC in stable condition accompanied by NT.
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
## 2017-07-18 NOTE — Progress Notes (Signed)
Patient ID: Amy Rubio, female   DOB: 10/01/1951, 66 y.o.   MRN: [id number] BP 139/60 (BP Location: Left Arm)   Pulse 65   Temp 98.1 F (36.7 C) (Oral)   Resp 18   Ht 5\' 5"  (1.651 m)   Wt 111 lb (50.3 kg)   SpO2 92%   BMI 18.47 kg/m   Postop day #3 status post open treatment internal fixation left hip  Cannulated screws were used to fix the femoral neck fracture  She is progressing a little slower than expected.  Recommend continue physical therapy progress as tolerated  Discharge when bedbound available if she's going to sniff if not she can go home with appropriate home therapy.  Follow-up instructions are in the postoperative note

## 2017-07-18 NOTE — Progress Notes (Signed)
OT Cancellation Note  Patient Details Name: Amy Rubio MRN: [id number] DOB: November 03, 1950   Cancelled Treatment:    Reason Eval/Treat Not Completed: Patient declined, no reason specified. Attempted OT treatment this am. Pt reports her wrist is much better and demonstrates ROM WNL, no edema noted. Pt declined to perform bed mobility tasks, transfer tasks, or ADL tasks this am. Educated pt on importance of getting up out of bed and moving around, states she will later today. Will attempt treatment again at a later time or tomorrow.   Guadelupe Sabin, OTR/L  [phone] 07/18/2017, 8:15 AM

## 2017-07-18 NOTE — Clinical Social Work Placement (Signed)
   CLINICAL SOCIAL WORK PLACEMENT  NOTE  Date:  07/18/2017  Patient Details  Name: Amy Rubio MRN: [id number] Date of Birth: April 24, 1951  Clinical Social Work is seeking post-discharge placement for this patient at the [place] level of care (*CSW will initial, date and re-position this form in  chart as items are completed):  Yes   Patient/family provided with [facility] Work Department's list of facilities offering this level of care within the geographic area requested by the patient (or if unable, by the patient's family).  Yes   Patient/family informed of their freedom to choose among providers that offer the needed level of care, that participate in Medicare, Medicaid or managed care program needed by the patient, have an available bed and are willing to accept the patient.  Yes   Patient/family informed of [facility]'s ownership interest in [facility] and [facility], as well as of the fact that they are under no obligation to receive care at these facilities.  PASRR submitted to EDS on 07/16/17     PASRR number received on       Existing PASRR number confirmed on       FL2 transmitted to all facilities in geographic area requested by pt/family on 07/16/17     FL2 transmitted to all facilities within larger geographic area on       Patient informed that his/her managed care company has contracts with or will negotiate with certain facilities, including the following:        Yes   Patient/family informed of bed offers received.  Patient chooses bed at [facility]     Physician recommends and patient chooses bed at      Patient to be transferred to [facility] on 07/18/17.  Patient to be transferred to facility by [facility] staff     Patient family notified on 07/18/17 of transfer.  Name of family member notified:  patient notified familiy      PHYSICIAN       Additional Comment:  LCSW spoke with Marianna Fuss at [facility]  and advised of discharge. Patient will be going to room 125.  LCSW sent discharge clinicals to facility. LCSW signing off.   _______________________________________________ Ihor Gully, LCSW 07/18/2017, 1:24 PM
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
## 2017-07-19 ENCOUNTER — Encounter: Payer: Self-pay | Admitting: Internal Medicine

## 2017-07-19 ENCOUNTER — Non-Acute Institutional Stay (SKILLED_NURSING_FACILITY): Payer: Medicare Other | Admitting: Internal Medicine

## 2017-07-19 DIAGNOSIS — F17200 Nicotine dependence, unspecified, uncomplicated: Secondary | ICD-10-CM | POA: Diagnosis not present

## 2017-07-19 DIAGNOSIS — M8080XS Other osteoporosis with current pathological fracture, unspecified site, sequela: Secondary | ICD-10-CM | POA: Diagnosis not present

## 2017-07-19 DIAGNOSIS — J441 Chronic obstructive pulmonary disease with (acute) exacerbation: Secondary | ICD-10-CM | POA: Diagnosis not present

## 2017-07-19 DIAGNOSIS — F419 Anxiety disorder, unspecified: Secondary | ICD-10-CM | POA: Diagnosis not present

## 2017-07-19 DIAGNOSIS — I10 Essential (primary) hypertension: Secondary | ICD-10-CM

## 2017-07-19 DIAGNOSIS — G894 Chronic pain syndrome: Secondary | ICD-10-CM | POA: Diagnosis not present

## 2017-07-19 LAB — TYPE AND SCREEN
ABO/RH(D): O POS
ANTIBODY SCREEN: NEGATIVE
UNIT DIVISION: 0
Unit division: 0

## 2017-07-19 LAB — BPAM RBC
BLOOD PRODUCT EXPIRATION DATE: 201811152359
Blood Product Expiration Date: 201811152359
Unit Type and Rh: 5100
Unit Type and Rh: 5100
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
## 2017-07-20 ENCOUNTER — Ambulatory Visit (HOSPITAL_COMMUNITY): Admission: RE | Admit: 2017-07-20 | Payer: Medicare Other | Source: Ambulatory Visit

## 2017-07-23 ENCOUNTER — Encounter (HOSPITAL_COMMUNITY)
Admission: RE | Admit: 2017-07-23 | Discharge: 2017-07-23 | Disposition: A | Discharge status: Home / Self Care | Payer: Medicare Other | Source: Skilled Nursing Facility | Attending: Internal Medicine | Admitting: Internal Medicine

## 2017-07-23 ENCOUNTER — Encounter (HOSPITAL_BASED_OUTPATIENT_CLINIC_OR_DEPARTMENT_OTHER): Payer: Self-pay

## 2017-07-23 ENCOUNTER — Ambulatory Visit (HOSPITAL_COMMUNITY): Payer: Medicare Other

## 2017-07-23 ENCOUNTER — Ambulatory Visit (HOSPITAL_BASED_OUTPATIENT_CLINIC_OR_DEPARTMENT_OTHER): Admit: 2017-07-23 | Payer: Medicare Other | Admitting: Otolaryngology

## 2017-07-23 DIAGNOSIS — J449 Chronic obstructive pulmonary disease, unspecified: Secondary | ICD-10-CM | POA: Insufficient documentation

## 2017-07-23 LAB — TSH: TSH: 1.628 u[IU]/mL (ref 0.350–4.500)

## 2017-07-23 LAB — CBC
HEMATOCRIT: 30.5 % — AB (ref 36.0–46.0)
HEMOGLOBIN: 9.8 g/dL — AB (ref 12.0–15.0)
MCH: 32.2 pg (ref 26.0–34.0)
MCHC: 32.1 g/dL (ref 30.0–36.0)
MCV: 100.3 fL — ABNORMAL HIGH (ref 78.0–100.0)
Platelets: 388 10*3/uL (ref 150–400)
RBC: 3.04 MIL/uL — ABNORMAL LOW (ref 3.87–5.11)
RDW: 15.6 % — AB (ref 11.5–15.5)
WBC: 5.4 10*3/uL (ref 4.0–10.5)

## 2017-07-23 LAB — COMPREHENSIVE METABOLIC PANEL
ALBUMIN: 2.9 g/dL — AB (ref 3.5–5.0)
ALK PHOS: 59 U/L (ref 38–126)
ALT: 12 U/L — ABNORMAL LOW (ref 14–54)
ANION GAP: 6 (ref 5–15)
AST: 15 U/L (ref 15–41)
BUN: 8 mg/dL (ref 6–20)
CALCIUM: 8.5 mg/dL — AB (ref 8.9–10.3)
CO2: 30 mmol/L (ref 22–32)
Chloride: 104 mmol/L (ref 101–111)
Creatinine, Ser: 0.61 mg/dL (ref 0.44–1.00)
GFR calc Af Amer: >60 mL/min (ref >60)
GFR calc non Af Amer: >60 mL/min (ref >60)
GLUCOSE: 97 mg/dL (ref 65–99)
Potassium: 4.1 mmol/L (ref 3.5–5.1)
SODIUM: 140 mmol/L (ref 135–145)
Total Bilirubin: 0.5 mg/dL (ref 0.3–1.2)
Total Protein: 5.4 g/dL — ABNORMAL LOW (ref 6.5–8.1)

## 2017-07-23 SURGERY — MICROLARYNGOSCOPY
Anesthesia: General

## 2017-07-25 ENCOUNTER — Encounter (HOSPITAL_COMMUNITY)
Admission: RE | Admit: 2017-07-25 | Discharge: 2017-07-25 | Disposition: A | Discharge status: Home / Self Care | Payer: Medicare Other | Source: Skilled Nursing Facility | Attending: Internal Medicine | Admitting: Internal Medicine

## 2017-07-25 LAB — BASIC METABOLIC PANEL
Anion gap: 7 (ref 5–15)
BUN: 8 mg/dL (ref 6–20)
CHLORIDE: 104 mmol/L (ref 101–111)
CO2: 27 mmol/L (ref 22–32)
CREATININE: 0.62 mg/dL (ref 0.44–1.00)
Calcium: 8.7 mg/dL — ABNORMAL LOW (ref 8.9–10.3)
GFR calc Af Amer: >60 mL/min (ref >60)
GFR calc non Af Amer: >60 mL/min (ref >60)
Glucose, Bld: 102 mg/dL — ABNORMAL HIGH (ref 65–99)
Potassium: 3.9 mmol/L (ref 3.5–5.1)
Sodium: 138 mmol/L (ref 135–145)

## 2017-07-25 LAB — CBC
HCT: 31.7 % — ABNORMAL LOW (ref 36.0–46.0)
Hemoglobin: 10.4 g/dL — ABNORMAL LOW (ref 12.0–15.0)
MCH: 32.4 pg (ref 26.0–34.0)
MCHC: 32.8 g/dL (ref 30.0–36.0)
MCV: 98.8 fL (ref 78.0–100.0)
PLATELETS: 425 10*3/uL — AB (ref 150–400)
RBC: 3.21 MIL/uL — ABNORMAL LOW (ref 3.87–5.11)
RDW: 15.2 % (ref 11.5–15.5)
WBC: 5 10*3/uL (ref 4.0–10.5)

## 2017-07-27 ENCOUNTER — Telehealth: Payer: Self-pay | Admitting: Family Medicine
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
## 2017-07-30 ENCOUNTER — Ambulatory Visit: Payer: Medicare Other | Admitting: Family Medicine

## 2017-07-30 DIAGNOSIS — Z9889 Other specified postprocedural states: Secondary | ICD-10-CM | POA: Insufficient documentation

## 2017-07-30 DIAGNOSIS — Z8781 Personal history of (healed) traumatic fracture: Secondary | ICD-10-CM

## 2017-07-31 ENCOUNTER — Encounter: Payer: Self-pay | Admitting: Internal Medicine

## 2017-07-31 ENCOUNTER — Non-Acute Institutional Stay (SKILLED_NURSING_FACILITY): Payer: Medicare Other | Admitting: Internal Medicine

## 2017-07-31 DIAGNOSIS — Z8781 Personal history of (healed) traumatic fracture: Secondary | ICD-10-CM

## 2017-07-31 DIAGNOSIS — Z967 Presence of other bone and tendon implants: Secondary | ICD-10-CM

## 2017-07-31 DIAGNOSIS — Z9889 Other specified postprocedural states: Secondary | ICD-10-CM

## 2017-07-31 DIAGNOSIS — I10 Essential (primary) hypertension: Secondary | ICD-10-CM

## 2017-07-31 DIAGNOSIS — F17200 Nicotine dependence, unspecified, uncomplicated: Secondary | ICD-10-CM | POA: Diagnosis not present

## 2017-07-31 DIAGNOSIS — M8080XS Other osteoporosis with current pathological fracture, unspecified site, sequela: Secondary | ICD-10-CM | POA: Diagnosis not present

## 2017-07-31 DIAGNOSIS — F419 Anxiety disorder, unspecified: Secondary | ICD-10-CM
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
## 2017-08-01 ENCOUNTER — Inpatient Hospital Stay (INDEPENDENT_AMBULATORY_CARE_PROVIDER_SITE_OTHER): Payer: Medicare Other

## 2017-08-01 ENCOUNTER — Ambulatory Visit (INDEPENDENT_AMBULATORY_CARE_PROVIDER_SITE_OTHER): Payer: Self-pay | Admitting: Orthopedic Surgery

## 2017-08-01 DIAGNOSIS — Z9889 Other specified postprocedural states: Secondary | ICD-10-CM

## 2017-08-01 DIAGNOSIS — Z967 Presence of other bone and tendon implants: Secondary | ICD-10-CM | POA: Diagnosis not present

## 2017-08-01 DIAGNOSIS — Z8781 Personal history of (healed) traumatic fracture: Secondary | ICD-10-CM

## 2017-08-01 DIAGNOSIS — S72002D Fracture of unspecified part of neck of left femur, subsequent encounter for closed fracture with routine healing: Secondary | ICD-10-CM

## 2017-08-01 MED ORDER — OXYCODONE-ACETAMINOPHEN 5-325 MG PO TABS: 1.0000 | ORAL_TABLET | ORAL | 0 refills | Status: DC | PRN

## 2017-08-01 NOTE — Patient Instructions (Signed)
Steps to Quit Smoking Smoking tobacco can be bad for your health. It can also affect almost every organ in your body. Smoking puts you and people around you at risk for many serious long-lasting (chronic) diseases. Quitting smoking is hard, but it is one of the best things that you can do for your health. It is never too late to quit. What are the benefits of quitting smoking? When you quit smoking, you lower your risk for getting serious diseases and conditions. They can include:  Lung cancer or lung disease.  Heart disease.  Stroke.  Heart attack.  Not being able to have children (infertility).  Weak bones (osteoporosis) and broken bones (fractures).  If you have coughing, wheezing, and shortness of breath, those symptoms may get better when you quit. You may also get sick less often. If you are pregnant, quitting smoking can help to lower your chances of having a baby of low birth weight. What can I do to help me quit smoking? Talk with your doctor about what can help you quit smoking. Some things you can do (strategies) include:  Quitting smoking totally, instead of slowly cutting back how much you smoke over a period of time.  Going to in-person counseling. You are more likely to quit if you go to many counseling sessions.  Using resources and support systems, such as: ? Online chats with a counselor. ? Phone quitlines. ? Printed self-help materials. ? Support groups or group counseling. ? Text messaging programs. ? Mobile phone apps or applications.  Taking medicines. Some of these medicines may have nicotine in them. If you are pregnant or breastfeeding, do not take any medicines to quit smoking unless your doctor says it is okay. Talk with your doctor about counseling or other things that can help you.  Talk with your doctor about using more than one strategy at the same time, such as taking medicines while you are also going to in-person counseling. This can help make  quitting easier. What things can I do to make it easier to quit? Quitting smoking might feel very hard at first, but there is a lot that you can do to make it easier. Take these steps:  Talk to your family and friends. Ask them to support and encourage you.  Call phone quitlines, reach out to support groups, or work with a counselor.  Ask people who smoke to not smoke around you.  Avoid places that make you want (trigger) to smoke, such as: ? Bars. ? Parties. ? Smoke-break areas at work.  Spend time with people who do not smoke.  Lower the stress in your life. Stress can make you want to smoke. Try these things to help your stress: ? Getting regular exercise. ? Deep-breathing exercises. ? Yoga. ? Meditating. ? Doing a body scan. To do this, close your eyes, focus on one area of your body at a time from head to toe, and notice which parts of your body are tense. Try to relax the muscles in those areas.  Download or buy apps on your mobile phone or tablet that can help you stick to your quit plan. There are many free apps, such as QuitGuide from the CDC (Centers for Disease Control and Prevention). You can find more support from smokefree.gov and other websites.  This information is not intended to replace advice given to you by your health care provider. Make sure you discuss any questions you have with your health care provider. Document Released: 07/15/2009 Document   Revised: 05/16/2016 Document Reviewed: 02/02/2015 Elsevier Interactive Patient Education  2018 Elsevier Inc.  

## 2017-08-01 NOTE — Progress Notes (Signed)
Postoperative visit patient is status post open treatment internal fixation left femoral neck fracture with left cannulated hip pinning  Chief Complaint  Patient presents with  . Routine Post Op    07/15/17     There were no vitals taken for this visit.  Pod 17   The incision looks clean dry and intact.  She can stand on her leg   Her x-ray looks good  Follow-up 4 weeks repeat the x-ray  Meds ordered this encounter  Medications  . oxyCODONE-acetaminophen (PERCOCET/ROXICET) 5-325 MG tablet    Sig: Take 1 tablet by mouth every 4 (four) hours as needed for severe pain.    Dispense:  42 tablet    Refill:  0

## 2017-08-03 DIAGNOSIS — F329 Major depressive disorder, single episode, unspecified: Secondary | ICD-10-CM | POA: Diagnosis not present

## 2017-08-03 DIAGNOSIS — F17210 Nicotine dependence, cigarettes, uncomplicated: Secondary | ICD-10-CM | POA: Diagnosis not present

## 2017-08-03 DIAGNOSIS — J449 Chronic obstructive pulmonary disease, unspecified: Secondary | ICD-10-CM | POA: Diagnosis not present

## 2017-08-03 DIAGNOSIS — F419 Anxiety disorder, unspecified: Secondary | ICD-10-CM | POA: Diagnosis not present

## 2017-08-03 DIAGNOSIS — M810 Age-related osteoporosis without current pathological fracture: Secondary | ICD-10-CM | POA: Diagnosis not present

## 2017-08-03 DIAGNOSIS — I10 Essential (primary) hypertension: Secondary | ICD-10-CM | POA: Diagnosis not present

## 2017-08-03 DIAGNOSIS — S72002D Fracture of unspecified part of neck of left femur, subsequent encounter for closed fracture with routine healing: Secondary | ICD-10-CM | POA: Diagnosis not present

## 2017-08-03 NOTE — Telephone Encounter (Signed)
Amy Rubio

## 2017-08-03 NOTE — Telephone Encounter (Signed)
Noticed to the front-we will do a follow-up visit with the patient once she gets discharged from the nursing home if she does not already have a primary care provider.  She has been given adequate notice to get herself a primary care provider.  At the same time I do not want to leave her in a pinch.  Once we do a follow-up visit though she will be released to the care of another primary care provider due to being discharged for other reasons previously-we will leave it up to the patient regarding this issue no need to call

## 2017-08-03 NOTE — Telephone Encounter (Signed)
We will provide for one additional follow-up when the patient comes out if she does not already have a primary care provider.  This patient had been dismissed from our practice due to other reasons.  The patient is aware of the need for her to get herself established with primary care-she has been given adequate notice.

## 2017-08-07 DIAGNOSIS — S72002D Fracture of unspecified part of neck of left femur, subsequent encounter for closed fracture with routine healing: Secondary | ICD-10-CM | POA: Diagnosis not present

## 2017-08-07 DIAGNOSIS — F419 Anxiety disorder, unspecified: Secondary | ICD-10-CM | POA: Diagnosis not present

## 2017-08-07 DIAGNOSIS — I10 Essential (primary) hypertension: Secondary | ICD-10-CM | POA: Diagnosis not present

## 2017-08-07 DIAGNOSIS — J449 Chronic obstructive pulmonary disease, unspecified: Secondary | ICD-10-CM | POA: Diagnosis not present

## 2017-08-07 DIAGNOSIS — M810 Age-related osteoporosis without current pathological fracture: Secondary | ICD-10-CM | POA: Diagnosis not present

## 2017-08-07 DIAGNOSIS — F329 Major depressive disorder, single episode, unspecified: Secondary | ICD-10-CM | POA: Diagnosis not present

## 2017-08-08 ENCOUNTER — Telehealth: Payer: Self-pay | Admitting: Orthopedic Surgery

## 2017-08-08 ENCOUNTER — Other Ambulatory Visit: Payer: Self-pay | Admitting: Orthopedic Surgery

## 2017-08-08 MED ORDER — HYDROCODONE-ACETAMINOPHEN 10-325 MG PO TABS: 1.0000 | ORAL_TABLET | ORAL | 0 refills | Status: DC | PRN

## 2017-08-08 NOTE — Progress Notes (Signed)
Amy Rubio                             Risk Indicators Nar: 447 Sed: 400 Stim: 000 ORS: 250 Red Flags [ 1 ]  NARX SCORES  Narcotic  431  Sedative  400  Stimulant  000  Explanation and Guidance  OVERDOSE RISK SCORE  250  (Range 000-999)  Explanation and Guidance

## 2017-08-08 NOTE — Telephone Encounter (Signed)
Patient called to request refill:  oxyCODONE-acetaminophen (PERCOCET/ROXICET) 5-325 MG tablet 42 tablet

## 2017-08-09 DIAGNOSIS — F419 Anxiety disorder, unspecified: Secondary | ICD-10-CM | POA: Diagnosis not present

## 2017-08-09 DIAGNOSIS — M810 Age-related osteoporosis without current pathological fracture: Secondary | ICD-10-CM | POA: Diagnosis not present

## 2017-08-09 DIAGNOSIS — F329 Major depressive disorder, single episode, unspecified: Secondary | ICD-10-CM | POA: Diagnosis not present

## 2017-08-09 DIAGNOSIS — J449 Chronic obstructive pulmonary disease, unspecified: Secondary | ICD-10-CM | POA: Diagnosis not present

## 2017-08-09 DIAGNOSIS — I10 Essential (primary) hypertension: Secondary | ICD-10-CM | POA: Diagnosis not present

## 2017-08-09 DIAGNOSIS — S72002D Fracture of unspecified part of neck of left femur, subsequent encounter for closed fracture with routine healing: Secondary | ICD-10-CM | POA: Diagnosis not present

## 2017-08-15 ENCOUNTER — Telehealth: Payer: Self-pay | Admitting: Orthopedic Surgery

## 2017-08-15 ENCOUNTER — Other Ambulatory Visit: Payer: Self-pay | Admitting: Orthopedic Surgery

## 2017-08-15 DIAGNOSIS — S72002D Fracture of unspecified part of neck of left femur, subsequent encounter for closed fracture with routine healing: Secondary | ICD-10-CM | POA: Diagnosis not present

## 2017-08-15 DIAGNOSIS — F419 Anxiety disorder, unspecified: Secondary | ICD-10-CM | POA: Diagnosis not present

## 2017-08-15 DIAGNOSIS — I10 Essential (primary) hypertension: Secondary | ICD-10-CM | POA: Diagnosis not present

## 2017-08-15 DIAGNOSIS — M810 Age-related osteoporosis without current pathological fracture: Secondary | ICD-10-CM | POA: Diagnosis not present

## 2017-08-15 DIAGNOSIS — J449 Chronic obstructive pulmonary disease, unspecified: Secondary | ICD-10-CM | POA: Diagnosis not present

## 2017-08-15 DIAGNOSIS — F329 Major depressive disorder, single episode, unspecified: Secondary | ICD-10-CM | POA: Diagnosis not present

## 2017-08-15 MED ORDER — HYDROCODONE-ACETAMINOPHEN 10-325 MG PO TABS: 1.0000 | ORAL_TABLET | ORAL | 0 refills | Status: DC | PRN

## 2017-08-15 NOTE — Telephone Encounter (Signed)
Ready after lunch

## 2017-08-15 NOTE — Telephone Encounter (Signed)
Patient aware, and picked up today.

## 2017-08-15 NOTE — Telephone Encounter (Signed)
Patient requests refill, pain medication, and is asking for this medication:  oxyCODONE-acetaminophen (PERCOCET/ROXICET) 5-325 MG tablet 42 tablet   (patient states Hydrocodone/Norco 10 did not help her)

## 2017-08-15 NOTE — Progress Notes (Signed)
done

## 2017-08-16 DIAGNOSIS — F329 Major depressive disorder, single episode, unspecified: Secondary | ICD-10-CM | POA: Diagnosis not present

## 2017-08-16 DIAGNOSIS — F419 Anxiety disorder, unspecified: Secondary | ICD-10-CM | POA: Diagnosis not present

## 2017-08-16 DIAGNOSIS — J449 Chronic obstructive pulmonary disease, unspecified: Secondary | ICD-10-CM | POA: Diagnosis not present

## 2017-08-16 DIAGNOSIS — S72002D Fracture of unspecified part of neck of left femur, subsequent encounter for closed fracture with routine healing: Secondary | ICD-10-CM | POA: Diagnosis not present

## 2017-08-16 DIAGNOSIS — I10 Essential (primary) hypertension: Secondary | ICD-10-CM | POA: Diagnosis not present

## 2017-08-16 DIAGNOSIS — M810 Age-related osteoporosis without current pathological fracture: Secondary | ICD-10-CM | POA: Diagnosis not present

## 2017-08-17 ENCOUNTER — Ambulatory Visit: Payer: Medicare Other | Admitting: Family Medicine

## 2017-08-17 DIAGNOSIS — J449 Chronic obstructive pulmonary disease, unspecified: Secondary | ICD-10-CM | POA: Diagnosis not present

## 2017-08-17 DIAGNOSIS — M810 Age-related osteoporosis without current pathological fracture: Secondary | ICD-10-CM | POA: Diagnosis not present

## 2017-08-17 DIAGNOSIS — S72002D Fracture of unspecified part of neck of left femur, subsequent encounter for closed fracture with routine healing: Secondary | ICD-10-CM | POA: Diagnosis not present

## 2017-08-17 DIAGNOSIS — I10 Essential (primary) hypertension: Secondary | ICD-10-CM | POA: Diagnosis not present

## 2017-08-20 DIAGNOSIS — J449 Chronic obstructive pulmonary disease, unspecified: Secondary | ICD-10-CM | POA: Diagnosis not present

## 2017-08-20 DIAGNOSIS — F419 Anxiety disorder, unspecified: Secondary | ICD-10-CM | POA: Diagnosis not present

## 2017-08-20 DIAGNOSIS — I10 Essential (primary) hypertension: Secondary | ICD-10-CM | POA: Diagnosis not present

## 2017-08-20 DIAGNOSIS — F329 Major depressive disorder, single episode, unspecified: Secondary | ICD-10-CM | POA: Diagnosis not present

## 2017-08-20 DIAGNOSIS — M810 Age-related osteoporosis without current pathological fracture: Secondary | ICD-10-CM | POA: Diagnosis not present

## 2017-08-20 DIAGNOSIS — S72002D Fracture of unspecified part of neck of left femur, subsequent encounter for closed fracture with routine healing: Secondary | ICD-10-CM | POA: Diagnosis not present

## 2017-08-21 ENCOUNTER — Other Ambulatory Visit: Payer: Self-pay | Admitting: Orthopedic Surgery

## 2017-08-21 ENCOUNTER — Telehealth: Payer: Self-pay | Admitting: Orthopaedic Surgery

## 2017-08-21 DIAGNOSIS — M25571 Pain in right ankle and joints of right foot: Secondary | ICD-10-CM

## 2017-08-21 MED ORDER — HYDROCODONE-ACETAMINOPHEN 7.5-325 MG PO TABS: 1.0000 | ORAL_TABLET | Freq: Four times a day (QID) | ORAL | 0 refills | Status: DC | PRN

## 2017-08-21 NOTE — Telephone Encounter (Signed)
Hydrocodone-Acetaminophen  10/325 mg Qty 42 Tablets  Take 1 tablet every 4 (four) hours as needed by mouth.

## 2017-08-21 NOTE — Telephone Encounter (Signed)
Her prescription is ready at the [facility]

## 2017-08-22 DIAGNOSIS — F419 Anxiety disorder, unspecified: Secondary | ICD-10-CM | POA: Diagnosis not present

## 2017-08-22 DIAGNOSIS — S72002D Fracture of unspecified part of neck of left femur, subsequent encounter for closed fracture with routine healing: Secondary | ICD-10-CM | POA: Diagnosis not present

## 2017-08-22 DIAGNOSIS — J449 Chronic obstructive pulmonary disease, unspecified: Secondary | ICD-10-CM | POA: Diagnosis not present

## 2017-08-22 DIAGNOSIS — M810 Age-related osteoporosis without current pathological fracture: Secondary | ICD-10-CM | POA: Diagnosis not present

## 2017-08-22 DIAGNOSIS — F329 Major depressive disorder, single episode, unspecified: Secondary | ICD-10-CM | POA: Diagnosis not present

## 2017-08-22 DIAGNOSIS — I10 Essential (primary) hypertension: Secondary | ICD-10-CM | POA: Diagnosis not present

## 2017-08-28 DIAGNOSIS — F329 Major depressive disorder, single episode, unspecified: Secondary | ICD-10-CM | POA: Diagnosis not present

## 2017-08-28 DIAGNOSIS — F419 Anxiety disorder, unspecified: Secondary | ICD-10-CM | POA: Diagnosis not present

## 2017-08-28 DIAGNOSIS — J449 Chronic obstructive pulmonary disease, unspecified: Secondary | ICD-10-CM | POA: Diagnosis not present

## 2017-08-28 DIAGNOSIS — M810 Age-related osteoporosis without current pathological fracture: Secondary | ICD-10-CM | POA: Diagnosis not present

## 2017-08-28 DIAGNOSIS — I10 Essential (primary) hypertension: Secondary | ICD-10-CM | POA: Diagnosis not present

## 2017-08-28 DIAGNOSIS — S72002D Fracture of unspecified part of neck of left femur, subsequent encounter for closed fracture with routine healing: Secondary | ICD-10-CM | POA: Diagnosis not present

## 2017-08-29 ENCOUNTER — Ambulatory Visit (INDEPENDENT_AMBULATORY_CARE_PROVIDER_SITE_OTHER): Payer: Medicare Other

## 2017-08-29 ENCOUNTER — Other Ambulatory Visit: Payer: Self-pay | Admitting: Orthopedic Surgery

## 2017-08-29 ENCOUNTER — Encounter: Payer: Self-pay | Admitting: Family Medicine

## 2017-08-29 ENCOUNTER — Encounter: Payer: Self-pay | Admitting: Orthopedic Surgery

## 2017-08-29 ENCOUNTER — Ambulatory Visit (INDEPENDENT_AMBULATORY_CARE_PROVIDER_SITE_OTHER): Payer: Self-pay | Admitting: Orthopedic Surgery

## 2017-08-29 VITALS — BP 137/63 | HR 88 | Ht 65.0 in | Wt 108.0 lb

## 2017-08-29 DIAGNOSIS — Z9889 Other specified postprocedural states: Secondary | ICD-10-CM

## 2017-08-29 DIAGNOSIS — Z8781 Personal history of (healed) traumatic fracture: Secondary | ICD-10-CM | POA: Diagnosis not present

## 2017-08-29 DIAGNOSIS — Z967 Presence of other bone and tendon implants: Secondary | ICD-10-CM | POA: Diagnosis not present

## 2017-08-29 DIAGNOSIS — Z4889 Encounter for other specified surgical aftercare: Secondary | ICD-10-CM

## 2017-08-29 MED ORDER — OXYCODONE-ACETAMINOPHEN 10-325 MG PO TABS: 1.0000 | ORAL_TABLET | ORAL | 0 refills | Status: DC | PRN

## 2017-08-29 NOTE — Progress Notes (Signed)
Chief Complaint  Patient presents with  . Routine Post Op    ORIF DOS 07/15/17     BP 137/63   Pulse 88   Ht 5\' 5"  (1.651 m)   Wt 108 lb (49 kg)   BMI 17.97 kg/m   Encounter Diagnoses  Name Primary?  . S/P ORIF (open reduction internal fixation) fracture( cannulated hip pins ) 07/15/17 Yes  . Aftercare following surgery     Meds ordered this encounter  Medications  . oxyCODONE-acetaminophen (PERCOCET) 10-325 MG tablet    Sig: Take 1 tablet by mouth every 4 (four) hours as needed for pain.    Dispense:  84 tablet    Refill:  0   xrays look good  Exam BP 137/63   Pulse 88   Ht 5\' 5"  (1.651 m)   Wt 108 lb (49 kg)   BMI 17.97 kg/m   Physical Exam  Musculoskeletal:       Left hip: She exhibits normal range of motion, normal strength, no tenderness, no bony tenderness and no deformity.   She says that norco does not work; she says she was on percocet 10 mg prior to surgery and was o it for years so I m going back to that   xrays in 6 weeks

## 2017-08-29 NOTE — Patient Instructions (Signed)
Steps to Quit Smoking Smoking tobacco can be bad for your health. It can also affect almost every organ in your body. Smoking puts you and people around you at risk for many serious Amy Rubio-lasting (chronic) diseases. Quitting smoking is hard, but it is one of the best things that you can do for your health. It is never too late to quit. What are the benefits of quitting smoking? When you quit smoking, you lower your risk for getting serious diseases and conditions. They can include:  Lung cancer or lung disease.  Heart disease.  Stroke.  Heart attack.  Not being able to have children (infertility).  Weak bones (osteoporosis) and broken bones (fractures).  If you have coughing, wheezing, and shortness of breath, those symptoms may get better when you quit. You may also get sick less often. If you are pregnant, quitting smoking can help to lower your chances of having a baby of low birth weight. What can I do to help me quit smoking? Talk with your doctor about what can help you quit smoking. Some things you can do (strategies) include:  Quitting smoking totally, instead of slowly cutting back how much you smoke over a period of time.  Going to in-person counseling. You are more likely to quit if you go to many counseling sessions.  Using resources and support systems, such as: ? Online chats with a counselor. ? Phone quitlines. ? Printed self-help materials. ? Support groups or group counseling. ? Text messaging programs. ? Mobile phone apps or applications.  Taking medicines. Some of these medicines may have nicotine in them. If you are pregnant or breastfeeding, do not take any medicines to quit smoking unless your doctor says it is okay. Talk with your doctor about counseling or other things that can help you.  Talk with your doctor about using more than one strategy at the same time, such as taking medicines while you are also going to in-person counseling. This can help make  quitting easier. What things can I do to make it easier to quit? Quitting smoking might feel very hard at first, but there is a lot that you can do to make it easier. Take these steps:  Talk to your family and friends. Ask them to support and encourage you.  Call phone quitlines, reach out to support groups, or work with a counselor.  Ask people who smoke to not smoke around you.  Avoid places that make you want (trigger) to smoke, such as: ? Bars. ? Parties. ? Smoke-break areas at work.  Spend time with people who do not smoke.  Lower the stress in your life. Stress can make you want to smoke. Try these things to help your stress: ? Getting regular exercise. ? Deep-breathing exercises. ? Yoga. ? Meditating. ? Doing a body scan. To do this, close your eyes, focus on one area of your body at a time from head to toe, and notice which parts of your body are tense. Try to relax the muscles in those areas.  Download or buy apps on your mobile phone or tablet that can help you stick to your quit plan. There are many free apps, such as QuitGuide from the CDC (Centers for Disease Control and Prevention). You can find more support from smokefree.gov and other websites.  This information is not intended to replace advice given to you by your health care provider. Make sure you discuss any questions you have with your health care provider. Document Released: 07/15/2009 Document   Revised: 05/16/2016 Document Reviewed: 02/02/2015 Elsevier Interactive Patient Education  2018 Elsevier Inc.  

## 2017-09-03 ENCOUNTER — Other Ambulatory Visit: Payer: Self-pay | Admitting: Family Medicine

## 2017-09-04 DIAGNOSIS — J449 Chronic obstructive pulmonary disease, unspecified: Secondary | ICD-10-CM | POA: Diagnosis not present

## 2017-09-04 DIAGNOSIS — F419 Anxiety disorder, unspecified: Secondary | ICD-10-CM | POA: Diagnosis not present

## 2017-09-04 DIAGNOSIS — I10 Essential (primary) hypertension: Secondary | ICD-10-CM | POA: Diagnosis not present

## 2017-09-04 DIAGNOSIS — M810 Age-related osteoporosis without current pathological fracture: Secondary | ICD-10-CM | POA: Diagnosis not present

## 2017-09-04 DIAGNOSIS — F329 Major depressive disorder, single episode, unspecified: Secondary | ICD-10-CM | POA: Diagnosis not present

## 2017-09-04 DIAGNOSIS — S72002D Fracture of unspecified part of neck of left femur, subsequent encounter for closed fracture with routine healing: Secondary | ICD-10-CM | POA: Diagnosis not present

## 2017-09-06 DIAGNOSIS — I10 Essential (primary) hypertension: Secondary | ICD-10-CM | POA: Diagnosis not present

## 2017-09-06 DIAGNOSIS — M810 Age-related osteoporosis without current pathological fracture: Secondary | ICD-10-CM | POA: Diagnosis not present

## 2017-09-06 DIAGNOSIS — S72002D Fracture of unspecified part of neck of left femur, subsequent encounter for closed fracture with routine healing: Secondary | ICD-10-CM | POA: Diagnosis not present

## 2017-09-06 DIAGNOSIS — F329 Major depressive disorder, single episode, unspecified: Secondary | ICD-10-CM | POA: Diagnosis not present

## 2017-09-06 DIAGNOSIS — J449 Chronic obstructive pulmonary disease, unspecified: Secondary | ICD-10-CM | POA: Diagnosis not present

## 2017-09-06 DIAGNOSIS — F419 Anxiety disorder, unspecified: Secondary | ICD-10-CM | POA: Diagnosis not present

## 2017-09-11 ENCOUNTER — Telehealth: Payer: Self-pay | Admitting: Orthopedic Surgery

## 2017-09-11 ENCOUNTER — Telehealth: Payer: Self-pay | Admitting: Radiology

## 2017-09-11 NOTE — Telephone Encounter (Signed)
Error / patient left message for refill, Angie has already sent her request to Dr Aline Brochure,.

## 2017-09-11 NOTE — Telephone Encounter (Signed)
Patient requests refill on Hydrocodone/Acetaminophen 7.5-325  Mgs.   Qty  42   Sig: Take 1 tablet by mouth every 6 (six) hours as needed for moderate pain.

## 2017-09-12 ENCOUNTER — Other Ambulatory Visit: Payer: Self-pay | Admitting: Orthopedic Surgery

## 2017-09-12 ENCOUNTER — Telehealth: Payer: Self-pay | Admitting: Orthopedic Surgery

## 2017-09-12 ENCOUNTER — Telehealth: Payer: Self-pay | Admitting: Radiology

## 2017-09-12 DIAGNOSIS — M810 Age-related osteoporosis without current pathological fracture: Secondary | ICD-10-CM | POA: Diagnosis not present

## 2017-09-12 DIAGNOSIS — S72002D Fracture of unspecified part of neck of left femur, subsequent encounter for closed fracture with routine healing: Secondary | ICD-10-CM | POA: Diagnosis not present

## 2017-09-12 DIAGNOSIS — F329 Major depressive disorder, single episode, unspecified: Secondary | ICD-10-CM | POA: Diagnosis not present

## 2017-09-12 DIAGNOSIS — S72145D Nondisplaced intertrochanteric fracture of left femur, subsequent encounter for closed fracture with routine healing: Secondary | ICD-10-CM

## 2017-09-12 DIAGNOSIS — I10 Essential (primary) hypertension: Secondary | ICD-10-CM | POA: Diagnosis not present

## 2017-09-12 DIAGNOSIS — F419 Anxiety disorder, unspecified: Secondary | ICD-10-CM | POA: Diagnosis not present

## 2017-09-12 DIAGNOSIS — J449 Chronic obstructive pulmonary disease, unspecified: Secondary | ICD-10-CM | POA: Diagnosis not present

## 2017-09-12 MED ORDER — HYDROCODONE-ACETAMINOPHEN 7.5-325 MG PO TABS: 1.0000 | ORAL_TABLET | Freq: Four times a day (QID) | ORAL | 0 refills | Status: DC | PRN

## 2017-09-12 NOTE — Telephone Encounter (Signed)
[place]

## 2017-09-12 NOTE — Telephone Encounter (Signed)
Patient came in today unhappy with Hydrocodone, wants "perc 10" I have told her Dr Aline Brochure can no longer prescribe the Percocet 10mg , at this point she needs to decrease her pain meds. She is very unhappy with this, but voiced understanding. I asked her the status of pain management and she states she has gotten a call, but did not schedule, since Dr Aline Brochure was prescribing for her. I told her if she wants the Percocet she must establish at pain management, Dr Aline Brochure will no longer prescribe  To you FYI

## 2017-09-12 NOTE — Telephone Encounter (Signed)
Patient's  home health PT from [facility] called and stated that he felt like Raynisha would benefit from having an AFO.  He would like for Dr. Aline Brochure to write an order for this.  He asked that the order along with pt's demographics and insurance information be faxed to [facility] on [address].  Would you speak with Dr. Aline Brochure regarding an AFO?  Her next appointment is not until 10-10-17  Thanks  Angie

## 2017-09-13 DIAGNOSIS — S72002D Fracture of unspecified part of neck of left femur, subsequent encounter for closed fracture with routine healing: Secondary | ICD-10-CM | POA: Diagnosis not present

## 2017-09-13 DIAGNOSIS — M810 Age-related osteoporosis without current pathological fracture: Secondary | ICD-10-CM | POA: Diagnosis not present

## 2017-09-13 DIAGNOSIS — I10 Essential (primary) hypertension: Secondary | ICD-10-CM | POA: Diagnosis not present

## 2017-09-13 DIAGNOSIS — F419 Anxiety disorder, unspecified: Secondary | ICD-10-CM | POA: Diagnosis not present

## 2017-09-13 DIAGNOSIS — F329 Major depressive disorder, single episode, unspecified: Secondary | ICD-10-CM | POA: Diagnosis not present

## 2017-09-13 DIAGNOSIS — J449 Chronic obstructive pulmonary disease, unspecified: Secondary | ICD-10-CM | POA: Diagnosis not present

## 2017-09-13 NOTE — Telephone Encounter (Signed)
Thanks I will make sure we discuss with patient when she is in the office next.

## 2017-09-13 NOTE — Telephone Encounter (Signed)
No I need to see this drop foot

## 2017-09-13 NOTE — Telephone Encounter (Signed)
She has had hip surgery left hip 07/15/17, the therapist is asking for AFO, but I do not have documentation of a foot drop. Can you order one for her?

## 2017-09-18 DIAGNOSIS — I10 Essential (primary) hypertension: Secondary | ICD-10-CM | POA: Diagnosis not present

## 2017-09-18 DIAGNOSIS — M1990 Unspecified osteoarthritis, unspecified site: Secondary | ICD-10-CM | POA: Diagnosis not present

## 2017-09-18 DIAGNOSIS — G14 Postpolio syndrome: Secondary | ICD-10-CM | POA: Diagnosis not present

## 2017-09-18 DIAGNOSIS — Z79899 Other long term (current) drug therapy: Secondary | ICD-10-CM | POA: Diagnosis not present

## 2017-09-19 DIAGNOSIS — M810 Age-related osteoporosis without current pathological fracture: Secondary | ICD-10-CM | POA: Diagnosis not present

## 2017-09-19 DIAGNOSIS — S72002D Fracture of unspecified part of neck of left femur, subsequent encounter for closed fracture with routine healing: Secondary | ICD-10-CM | POA: Diagnosis not present

## 2017-09-19 DIAGNOSIS — I10 Essential (primary) hypertension: Secondary | ICD-10-CM | POA: Diagnosis not present

## 2017-09-19 DIAGNOSIS — F419 Anxiety disorder, unspecified: Secondary | ICD-10-CM | POA: Diagnosis not present

## 2017-09-19 DIAGNOSIS — J449 Chronic obstructive pulmonary disease, unspecified: Secondary | ICD-10-CM | POA: Diagnosis not present

## 2017-09-19 DIAGNOSIS — F329 Major depressive disorder, single episode, unspecified: Secondary | ICD-10-CM | POA: Diagnosis not present

## 2017-09-21 DIAGNOSIS — S72002D Fracture of unspecified part of neck of left femur, subsequent encounter for closed fracture with routine healing: Secondary | ICD-10-CM | POA: Diagnosis not present

## 2017-09-21 DIAGNOSIS — J449 Chronic obstructive pulmonary disease, unspecified: Secondary | ICD-10-CM | POA: Diagnosis not present

## 2017-09-21 DIAGNOSIS — F329 Major depressive disorder, single episode, unspecified: Secondary | ICD-10-CM | POA: Diagnosis not present

## 2017-09-21 DIAGNOSIS — I10 Essential (primary) hypertension: Secondary | ICD-10-CM | POA: Diagnosis not present

## 2017-09-21 DIAGNOSIS — M810 Age-related osteoporosis without current pathological fracture: Secondary | ICD-10-CM | POA: Diagnosis not present

## 2017-09-21 DIAGNOSIS — F419 Anxiety disorder, unspecified: Secondary | ICD-10-CM | POA: Diagnosis not present

## 2017-10-01 DIAGNOSIS — F419 Anxiety disorder, unspecified: Secondary | ICD-10-CM | POA: Diagnosis not present

## 2017-10-01 DIAGNOSIS — I10 Essential (primary) hypertension: Secondary | ICD-10-CM | POA: Diagnosis not present

## 2017-10-01 DIAGNOSIS — S72002D Fracture of unspecified part of neck of left femur, subsequent encounter for closed fracture with routine healing: Secondary | ICD-10-CM | POA: Diagnosis not present

## 2017-10-01 DIAGNOSIS — M810 Age-related osteoporosis without current pathological fracture: Secondary | ICD-10-CM | POA: Diagnosis not present

## 2017-10-01 DIAGNOSIS — F329 Major depressive disorder, single episode, unspecified: Secondary | ICD-10-CM | POA: Diagnosis not present

## 2017-10-01 DIAGNOSIS — J449 Chronic obstructive pulmonary disease, unspecified: Secondary | ICD-10-CM | POA: Diagnosis not present

## 2017-10-02 DIAGNOSIS — I10 Essential (primary) hypertension: Secondary | ICD-10-CM | POA: Diagnosis not present

## 2017-10-02 DIAGNOSIS — M810 Age-related osteoporosis without current pathological fracture: Secondary | ICD-10-CM | POA: Diagnosis not present

## 2017-10-02 DIAGNOSIS — F17210 Nicotine dependence, cigarettes, uncomplicated: Secondary | ICD-10-CM | POA: Diagnosis not present

## 2017-10-02 DIAGNOSIS — S72002D Fracture of unspecified part of neck of left femur, subsequent encounter for closed fracture with routine healing: Secondary | ICD-10-CM | POA: Diagnosis not present

## 2017-10-02 DIAGNOSIS — F329 Major depressive disorder, single episode, unspecified: Secondary | ICD-10-CM | POA: Diagnosis not present

## 2017-10-02 DIAGNOSIS — J449 Chronic obstructive pulmonary disease, unspecified: Secondary | ICD-10-CM | POA: Diagnosis not present

## 2017-10-02 DIAGNOSIS — F419 Anxiety disorder, unspecified: Secondary | ICD-10-CM | POA: Diagnosis not present

## 2017-10-03 DIAGNOSIS — Z79899 Other long term (current) drug therapy: Secondary | ICD-10-CM | POA: Diagnosis not present

## 2017-10-03 DIAGNOSIS — M1990 Unspecified osteoarthritis, unspecified site: Secondary | ICD-10-CM | POA: Diagnosis not present

## 2017-10-05 DIAGNOSIS — J449 Chronic obstructive pulmonary disease, unspecified: Secondary | ICD-10-CM | POA: Diagnosis not present

## 2017-10-05 DIAGNOSIS — M810 Age-related osteoporosis without current pathological fracture: Secondary | ICD-10-CM | POA: Diagnosis not present

## 2017-10-05 DIAGNOSIS — F419 Anxiety disorder, unspecified: Secondary | ICD-10-CM | POA: Diagnosis not present

## 2017-10-05 DIAGNOSIS — S72002D Fracture of unspecified part of neck of left femur, subsequent encounter for closed fracture with routine healing: Secondary | ICD-10-CM | POA: Diagnosis not present

## 2017-10-05 DIAGNOSIS — I10 Essential (primary) hypertension: Secondary | ICD-10-CM | POA: Diagnosis not present

## 2017-10-05 DIAGNOSIS — F329 Major depressive disorder, single episode, unspecified: Secondary | ICD-10-CM | POA: Diagnosis not present

## 2017-10-08 DIAGNOSIS — S72002D Fracture of unspecified part of neck of left femur, subsequent encounter for closed fracture with routine healing: Secondary | ICD-10-CM | POA: Diagnosis not present

## 2017-10-08 DIAGNOSIS — F419 Anxiety disorder, unspecified: Secondary | ICD-10-CM | POA: Diagnosis not present

## 2017-10-08 DIAGNOSIS — F329 Major depressive disorder, single episode, unspecified: Secondary | ICD-10-CM | POA: Diagnosis not present

## 2017-10-08 DIAGNOSIS — J449 Chronic obstructive pulmonary disease, unspecified: Secondary | ICD-10-CM | POA: Diagnosis not present

## 2017-10-08 DIAGNOSIS — M810 Age-related osteoporosis without current pathological fracture: Secondary | ICD-10-CM | POA: Diagnosis not present

## 2017-10-08 DIAGNOSIS — I10 Essential (primary) hypertension: Secondary | ICD-10-CM | POA: Diagnosis not present

## 2017-10-10 ENCOUNTER — Ambulatory Visit (INDEPENDENT_AMBULATORY_CARE_PROVIDER_SITE_OTHER): Payer: Medicare Other

## 2017-10-10 ENCOUNTER — Ambulatory Visit (INDEPENDENT_AMBULATORY_CARE_PROVIDER_SITE_OTHER): Payer: Self-pay | Admitting: Orthopedic Surgery

## 2017-10-10 VITALS — BP 134/58 | HR 66 | Ht 65.0 in | Wt 108.0 lb

## 2017-10-10 DIAGNOSIS — Z967 Presence of other bone and tendon implants: Secondary | ICD-10-CM | POA: Diagnosis not present

## 2017-10-10 DIAGNOSIS — Z8781 Personal history of (healed) traumatic fracture: Secondary | ICD-10-CM

## 2017-10-10 DIAGNOSIS — Z9889 Other specified postprocedural states: Secondary | ICD-10-CM

## 2017-10-10 NOTE — Progress Notes (Signed)
POST OP  Encounter Diagnosis  Name Primary?  . 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  She would like a foot brace with an AFO which we can do  She is having some difficulty with her pain management doctors in terms of what medications they are giving her and I will let them manage that  X-rays today were obtained  Her exam shows that she does have some dorsiflexion strength grade 4.  Recommend 23-month follow-up for x-ray of the hip

## 2017-10-11 DIAGNOSIS — S72002D Fracture of unspecified part of neck of left femur, subsequent encounter for closed fracture with routine healing: Secondary | ICD-10-CM | POA: Diagnosis not present

## 2017-10-11 DIAGNOSIS — I10 Essential (primary) hypertension: Secondary | ICD-10-CM | POA: Diagnosis not present

## 2017-10-11 DIAGNOSIS — J449 Chronic obstructive pulmonary disease, unspecified: Secondary | ICD-10-CM | POA: Diagnosis not present

## 2017-10-11 DIAGNOSIS — F329 Major depressive disorder, single episode, unspecified: Secondary | ICD-10-CM | POA: Diagnosis not present

## 2017-10-11 DIAGNOSIS — M810 Age-related osteoporosis without current pathological fracture: Secondary | ICD-10-CM | POA: Diagnosis not present

## 2017-10-11 DIAGNOSIS — F419 Anxiety disorder, unspecified: Secondary | ICD-10-CM | POA: Diagnosis not present

## 2017-10-12 ENCOUNTER — Other Ambulatory Visit: Payer: Self-pay | Admitting: Family Medicine

## 2017-10-15 DIAGNOSIS — S72002D Fracture of unspecified part of neck of left femur, subsequent encounter for closed fracture with routine healing: Secondary | ICD-10-CM | POA: Diagnosis not present

## 2017-10-15 DIAGNOSIS — I10 Essential (primary) hypertension: Secondary | ICD-10-CM | POA: Diagnosis not present

## 2017-10-15 DIAGNOSIS — M810 Age-related osteoporosis without current pathological fracture: Secondary | ICD-10-CM | POA: Diagnosis not present

## 2017-10-15 DIAGNOSIS — F329 Major depressive disorder, single episode, unspecified: Secondary | ICD-10-CM | POA: Diagnosis not present

## 2017-10-15 DIAGNOSIS — F419 Anxiety disorder, unspecified: Secondary | ICD-10-CM | POA: Diagnosis not present

## 2017-10-15 DIAGNOSIS — J449 Chronic obstructive pulmonary disease, unspecified: Secondary | ICD-10-CM | POA: Diagnosis not present

## 2017-10-17 DIAGNOSIS — F329 Major depressive disorder, single episode, unspecified: Secondary | ICD-10-CM | POA: Diagnosis not present

## 2017-10-17 DIAGNOSIS — I10 Essential (primary) hypertension: Secondary | ICD-10-CM | POA: Diagnosis not present

## 2017-10-17 DIAGNOSIS — J449 Chronic obstructive pulmonary disease, unspecified: Secondary | ICD-10-CM | POA: Diagnosis not present

## 2017-10-17 DIAGNOSIS — M810 Age-related osteoporosis without current pathological fracture: Secondary | ICD-10-CM | POA: Diagnosis not present

## 2017-10-17 DIAGNOSIS — S72002D Fracture of unspecified part of neck of left femur, subsequent encounter for closed fracture with routine healing: Secondary | ICD-10-CM | POA: Diagnosis not present

## 2017-10-17 DIAGNOSIS — F419 Anxiety disorder, unspecified: Secondary | ICD-10-CM | POA: Diagnosis not present

## 2017-10-22 DIAGNOSIS — F329 Major depressive disorder, single episode, unspecified: Secondary | ICD-10-CM | POA: Diagnosis not present

## 2017-10-22 DIAGNOSIS — J449 Chronic obstructive pulmonary disease, unspecified: Secondary | ICD-10-CM | POA: Diagnosis not present

## 2017-10-22 DIAGNOSIS — I10 Essential (primary) hypertension: Secondary | ICD-10-CM | POA: Diagnosis not present

## 2017-10-22 DIAGNOSIS — F419 Anxiety disorder, unspecified: Secondary | ICD-10-CM | POA: Diagnosis not present

## 2017-10-22 DIAGNOSIS — M810 Age-related osteoporosis without current pathological fracture: Secondary | ICD-10-CM | POA: Diagnosis not present

## 2017-10-22 DIAGNOSIS — S72002D Fracture of unspecified part of neck of left femur, subsequent encounter for closed fracture with routine healing: Secondary | ICD-10-CM | POA: Diagnosis not present

## 2017-10-24 DIAGNOSIS — I10 Essential (primary) hypertension: Secondary | ICD-10-CM | POA: Diagnosis not present

## 2017-10-24 DIAGNOSIS — F419 Anxiety disorder, unspecified: Secondary | ICD-10-CM | POA: Diagnosis not present

## 2017-10-24 DIAGNOSIS — M810 Age-related osteoporosis without current pathological fracture: Secondary | ICD-10-CM | POA: Diagnosis not present

## 2017-10-24 DIAGNOSIS — J449 Chronic obstructive pulmonary disease, unspecified: Secondary | ICD-10-CM | POA: Diagnosis not present

## 2017-10-24 DIAGNOSIS — F329 Major depressive disorder, single episode, unspecified: Secondary | ICD-10-CM | POA: Diagnosis not present

## 2017-10-24 DIAGNOSIS — S72002D Fracture of unspecified part of neck of left femur, subsequent encounter for closed fracture with routine healing: Secondary | ICD-10-CM | POA: Diagnosis not present

## 2017-10-29 DIAGNOSIS — F329 Major depressive disorder, single episode, unspecified: Secondary | ICD-10-CM | POA: Diagnosis not present

## 2017-10-29 DIAGNOSIS — J449 Chronic obstructive pulmonary disease, unspecified: Secondary | ICD-10-CM | POA: Diagnosis not present

## 2017-10-29 DIAGNOSIS — I10 Essential (primary) hypertension: Secondary | ICD-10-CM | POA: Diagnosis not present

## 2017-10-29 DIAGNOSIS — M810 Age-related osteoporosis without current pathological fracture: Secondary | ICD-10-CM | POA: Diagnosis not present

## 2017-10-29 DIAGNOSIS — S72002D Fracture of unspecified part of neck of left femur, subsequent encounter for closed fracture with routine healing: Secondary | ICD-10-CM | POA: Diagnosis not present

## 2017-10-29 DIAGNOSIS — F419 Anxiety disorder, unspecified: Secondary | ICD-10-CM | POA: Diagnosis not present

## 2017-10-29 IMAGING — DX DG CHEST 2V
2 series · 2 of 2 positions shown · non-contrast
Comparison: 03/02/2016 ; correlation CT chest 04/28/2016

CLINICAL DATA: Hoarseness, COPD, chronic cough, weight loss, recent
polyp on ENT exam of throat, smoker, hypertension

EXAM:
CHEST  2 VIEW

[chest pa]
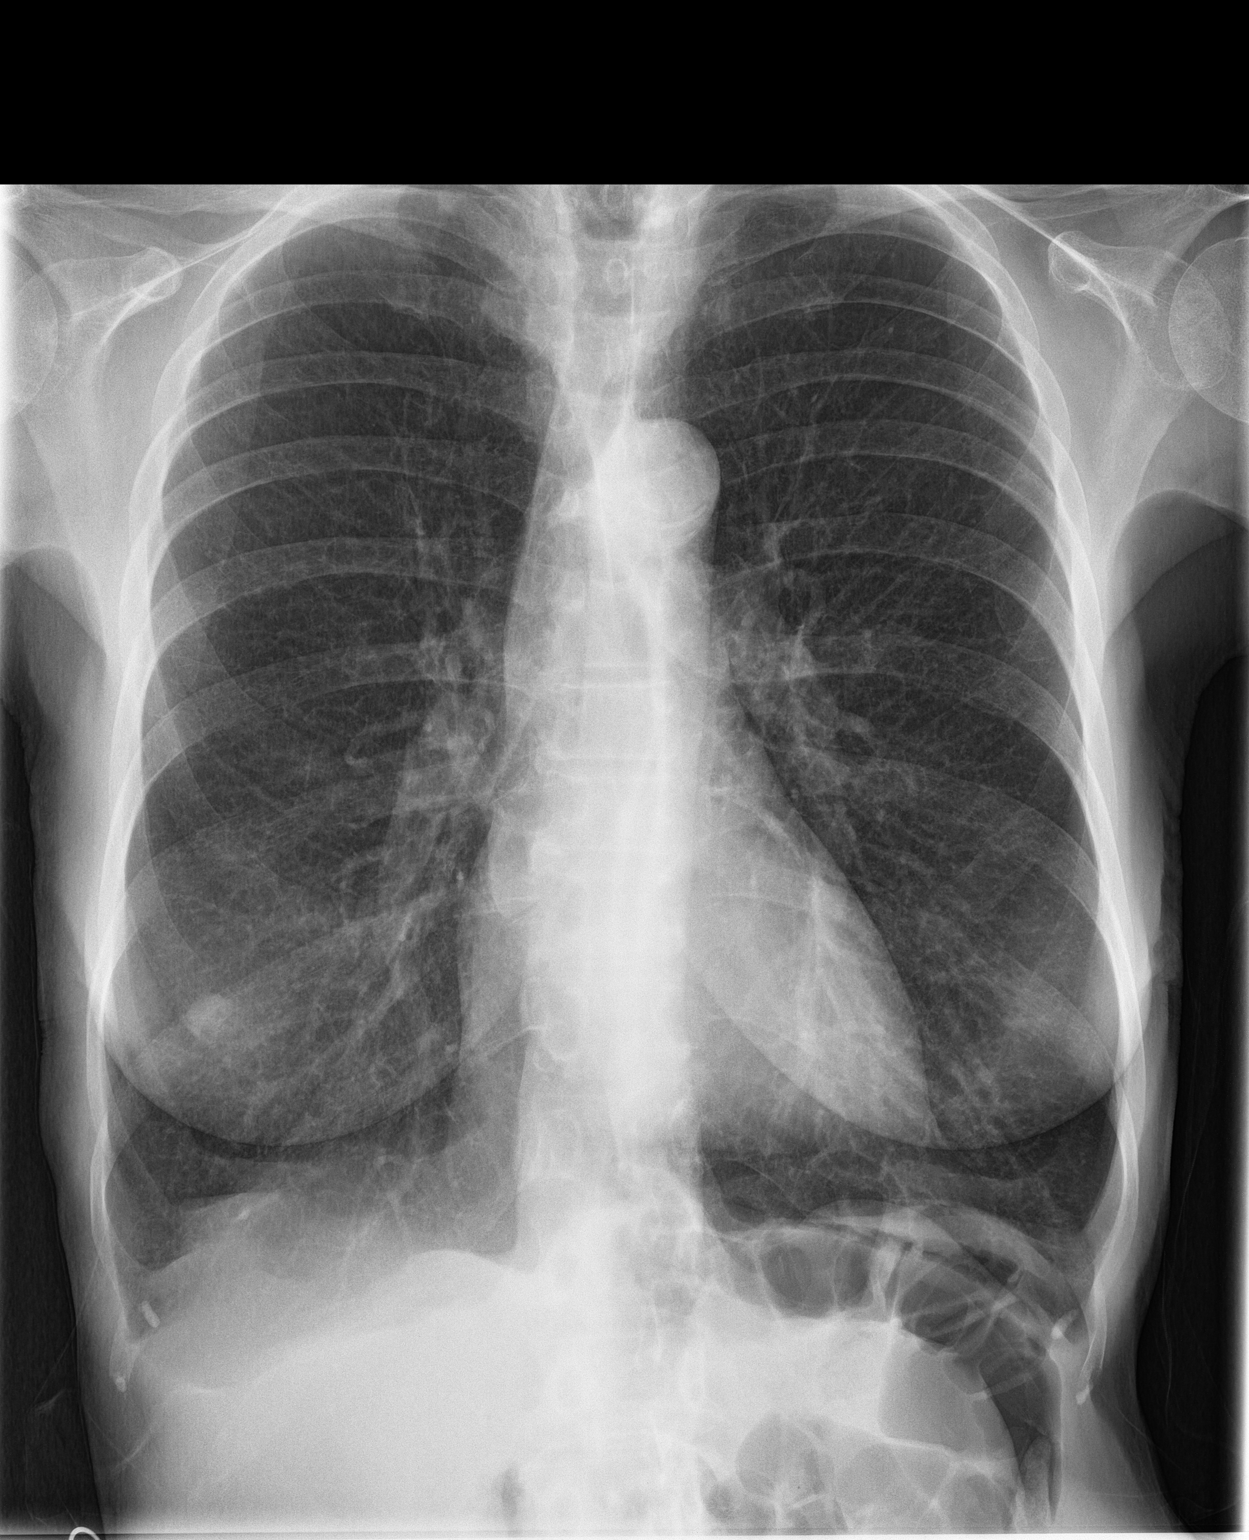

[chest lat]
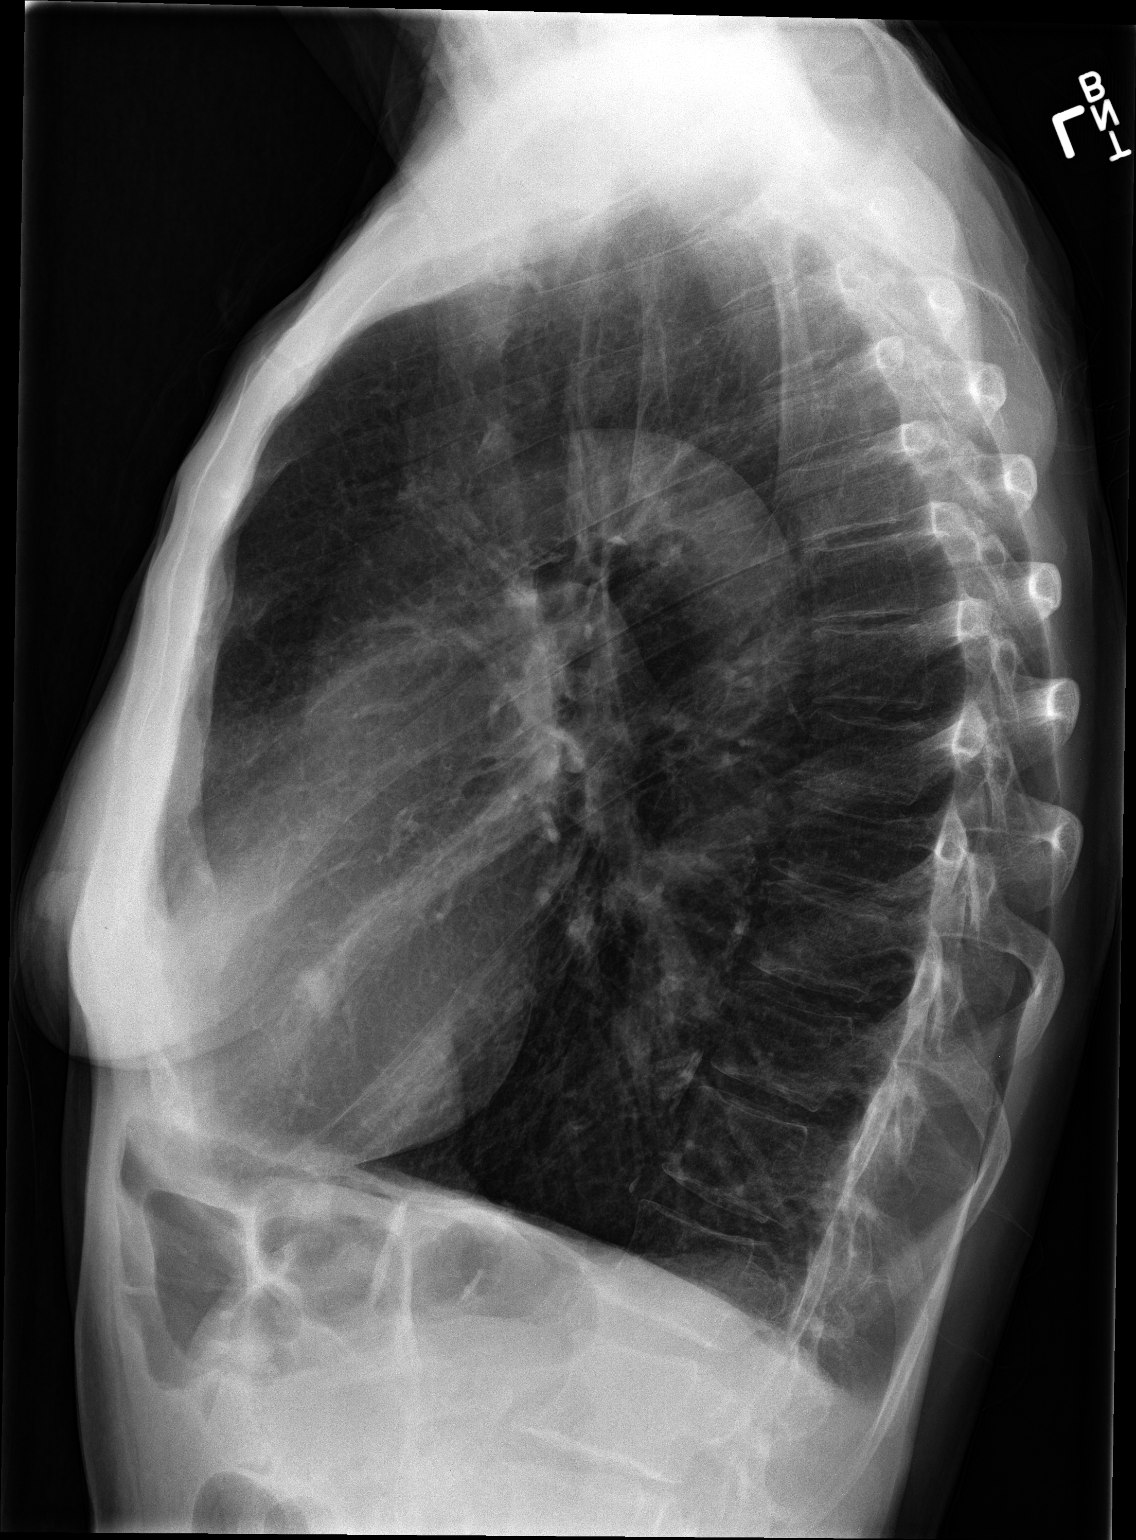

[2 of 2 positions shown; findings below may reference images not displayed]

FINDINGS: Normal heart size, mediastinal contours, and pulmonary vascularity.

Atherosclerotic calcifications aorta.

BILATERAL nipple shadows.

Additional nodular density at the anterior lung bases on lateral
view corresponds to a bone island in the anterior RIGHT seventh rib
on a prior CT exam (series 5, image 15) and is unchanged.

Lungs emphysematous but clear.

No infiltrate, pleural effusion or pneumothorax.

Bones appear demineralized.
IMPRESSION: COPD changes without acute infiltrate.

## 2017-10-31 DIAGNOSIS — F419 Anxiety disorder, unspecified: Secondary | ICD-10-CM | POA: Diagnosis not present

## 2017-10-31 DIAGNOSIS — S72002D Fracture of unspecified part of neck of left femur, subsequent encounter for closed fracture with routine healing: Secondary | ICD-10-CM | POA: Diagnosis not present

## 2017-10-31 DIAGNOSIS — I10 Essential (primary) hypertension: Secondary | ICD-10-CM | POA: Diagnosis not present

## 2017-10-31 DIAGNOSIS — F329 Major depressive disorder, single episode, unspecified: Secondary | ICD-10-CM | POA: Diagnosis not present

## 2017-10-31 DIAGNOSIS — M810 Age-related osteoporosis without current pathological fracture: Secondary | ICD-10-CM | POA: Diagnosis not present

## 2017-10-31 DIAGNOSIS — J449 Chronic obstructive pulmonary disease, unspecified: Secondary | ICD-10-CM | POA: Diagnosis not present

## 2017-11-05 DIAGNOSIS — S72002D Fracture of unspecified part of neck of left femur, subsequent encounter for closed fracture with routine healing: Secondary | ICD-10-CM | POA: Diagnosis not present

## 2017-11-05 DIAGNOSIS — F419 Anxiety disorder, unspecified: Secondary | ICD-10-CM | POA: Diagnosis not present

## 2017-11-05 DIAGNOSIS — J449 Chronic obstructive pulmonary disease, unspecified: Secondary | ICD-10-CM | POA: Diagnosis not present

## 2017-11-05 DIAGNOSIS — F329 Major depressive disorder, single episode, unspecified: Secondary | ICD-10-CM | POA: Diagnosis not present

## 2017-11-05 DIAGNOSIS — M810 Age-related osteoporosis without current pathological fracture: Secondary | ICD-10-CM | POA: Diagnosis not present

## 2017-11-05 DIAGNOSIS — I10 Essential (primary) hypertension: Secondary | ICD-10-CM | POA: Diagnosis not present

## 2017-11-08 DIAGNOSIS — I10 Essential (primary) hypertension: Secondary | ICD-10-CM | POA: Diagnosis not present

## 2017-11-08 DIAGNOSIS — M810 Age-related osteoporosis without current pathological fracture: Secondary | ICD-10-CM | POA: Diagnosis not present

## 2017-11-08 DIAGNOSIS — Z79899 Other long term (current) drug therapy: Secondary | ICD-10-CM | POA: Diagnosis not present

## 2017-11-08 DIAGNOSIS — F419 Anxiety disorder, unspecified: Secondary | ICD-10-CM | POA: Diagnosis not present

## 2017-11-08 DIAGNOSIS — F329 Major depressive disorder, single episode, unspecified: Secondary | ICD-10-CM | POA: Diagnosis not present

## 2017-11-08 DIAGNOSIS — F17210 Nicotine dependence, cigarettes, uncomplicated: Secondary | ICD-10-CM | POA: Diagnosis not present

## 2017-11-08 DIAGNOSIS — S72002D Fracture of unspecified part of neck of left femur, subsequent encounter for closed fracture with routine healing: Secondary | ICD-10-CM | POA: Diagnosis not present

## 2017-11-08 DIAGNOSIS — Z681 Body mass index (BMI) 19 or less, adult: Secondary | ICD-10-CM | POA: Diagnosis not present

## 2017-11-08 DIAGNOSIS — J449 Chronic obstructive pulmonary disease, unspecified: Secondary | ICD-10-CM | POA: Diagnosis not present

## 2017-11-12 ENCOUNTER — Telehealth: Payer: Self-pay | Admitting: Radiology

## 2017-11-12 DIAGNOSIS — I10 Essential (primary) hypertension: Secondary | ICD-10-CM | POA: Diagnosis not present

## 2017-11-12 DIAGNOSIS — M810 Age-related osteoporosis without current pathological fracture: Secondary | ICD-10-CM | POA: Diagnosis not present

## 2017-11-12 DIAGNOSIS — F419 Anxiety disorder, unspecified: Secondary | ICD-10-CM | POA: Diagnosis not present

## 2017-11-12 DIAGNOSIS — F329 Major depressive disorder, single episode, unspecified: Secondary | ICD-10-CM | POA: Diagnosis not present

## 2017-11-12 DIAGNOSIS — S72002D Fracture of unspecified part of neck of left femur, subsequent encounter for closed fracture with routine healing: Secondary | ICD-10-CM | POA: Diagnosis not present

## 2017-11-12 DIAGNOSIS — J449 Chronic obstructive pulmonary disease, unspecified: Secondary | ICD-10-CM | POA: Diagnosis not present

## 2017-11-12 NOTE — Telephone Encounter (Signed)
Faxed last office note to [facility] for her AFO

## 2017-11-13 DIAGNOSIS — M1990 Unspecified osteoarthritis, unspecified site: Secondary | ICD-10-CM | POA: Diagnosis not present

## 2017-11-13 DIAGNOSIS — Z79899 Other long term (current) drug therapy: Secondary | ICD-10-CM | POA: Diagnosis not present

## 2017-11-14 DIAGNOSIS — S72002D Fracture of unspecified part of neck of left femur, subsequent encounter for closed fracture with routine healing: Secondary | ICD-10-CM | POA: Diagnosis not present

## 2017-11-14 DIAGNOSIS — F419 Anxiety disorder, unspecified: Secondary | ICD-10-CM | POA: Diagnosis not present

## 2017-11-14 DIAGNOSIS — J449 Chronic obstructive pulmonary disease, unspecified: Secondary | ICD-10-CM | POA: Diagnosis not present

## 2017-11-14 DIAGNOSIS — F329 Major depressive disorder, single episode, unspecified: Secondary | ICD-10-CM | POA: Diagnosis not present

## 2017-11-14 DIAGNOSIS — I10 Essential (primary) hypertension: Secondary | ICD-10-CM | POA: Diagnosis not present

## 2017-11-14 DIAGNOSIS — M810 Age-related osteoporosis without current pathological fracture: Secondary | ICD-10-CM | POA: Diagnosis not present

## 2017-11-19 DIAGNOSIS — S72002D Fracture of unspecified part of neck of left femur, subsequent encounter for closed fracture with routine healing: Secondary | ICD-10-CM | POA: Diagnosis not present

## 2017-11-19 DIAGNOSIS — I10 Essential (primary) hypertension: Secondary | ICD-10-CM | POA: Diagnosis not present

## 2017-11-19 DIAGNOSIS — J449 Chronic obstructive pulmonary disease, unspecified: Secondary | ICD-10-CM | POA: Diagnosis not present

## 2017-11-19 DIAGNOSIS — F419 Anxiety disorder, unspecified: Secondary | ICD-10-CM | POA: Diagnosis not present

## 2017-11-19 DIAGNOSIS — M810 Age-related osteoporosis without current pathological fracture: Secondary | ICD-10-CM | POA: Diagnosis not present

## 2017-11-19 DIAGNOSIS — F329 Major depressive disorder, single episode, unspecified: Secondary | ICD-10-CM | POA: Diagnosis not present

## 2017-11-21 DIAGNOSIS — F329 Major depressive disorder, single episode, unspecified: Secondary | ICD-10-CM | POA: Diagnosis not present

## 2017-11-21 DIAGNOSIS — S72002D Fracture of unspecified part of neck of left femur, subsequent encounter for closed fracture with routine healing: Secondary | ICD-10-CM | POA: Diagnosis not present

## 2017-11-21 DIAGNOSIS — F419 Anxiety disorder, unspecified: Secondary | ICD-10-CM | POA: Diagnosis not present

## 2017-11-21 DIAGNOSIS — I10 Essential (primary) hypertension: Secondary | ICD-10-CM | POA: Diagnosis not present

## 2017-11-21 DIAGNOSIS — J449 Chronic obstructive pulmonary disease, unspecified: Secondary | ICD-10-CM | POA: Diagnosis not present

## 2017-11-21 DIAGNOSIS — M810 Age-related osteoporosis without current pathological fracture: Secondary | ICD-10-CM | POA: Diagnosis not present

## 2017-11-23 DIAGNOSIS — J449 Chronic obstructive pulmonary disease, unspecified: Secondary | ICD-10-CM | POA: Diagnosis not present

## 2017-11-23 DIAGNOSIS — E559 Vitamin D deficiency, unspecified: Secondary | ICD-10-CM | POA: Diagnosis not present

## 2017-11-23 DIAGNOSIS — R7301 Impaired fasting glucose: Secondary | ICD-10-CM | POA: Diagnosis not present

## 2017-11-23 DIAGNOSIS — F17210 Nicotine dependence, cigarettes, uncomplicated: Secondary | ICD-10-CM | POA: Diagnosis not present

## 2017-11-23 DIAGNOSIS — Z79899 Other long term (current) drug therapy: Secondary | ICD-10-CM | POA: Diagnosis not present

## 2017-11-23 DIAGNOSIS — R5381 Other malaise: Secondary | ICD-10-CM | POA: Diagnosis not present

## 2017-11-23 DIAGNOSIS — Z681 Body mass index (BMI) 19 or less, adult: Secondary | ICD-10-CM | POA: Diagnosis not present

## 2017-11-26 DIAGNOSIS — S72002D Fracture of unspecified part of neck of left femur, subsequent encounter for closed fracture with routine healing: Secondary | ICD-10-CM | POA: Diagnosis not present

## 2017-11-26 DIAGNOSIS — I10 Essential (primary) hypertension: Secondary | ICD-10-CM | POA: Diagnosis not present

## 2017-11-26 DIAGNOSIS — M810 Age-related osteoporosis without current pathological fracture: Secondary | ICD-10-CM | POA: Diagnosis not present

## 2017-11-26 DIAGNOSIS — J449 Chronic obstructive pulmonary disease, unspecified: Secondary | ICD-10-CM | POA: Diagnosis not present

## 2017-11-26 DIAGNOSIS — F329 Major depressive disorder, single episode, unspecified: Secondary | ICD-10-CM | POA: Diagnosis not present

## 2017-11-26 DIAGNOSIS — F419 Anxiety disorder, unspecified: Secondary | ICD-10-CM | POA: Diagnosis not present

## 2017-11-28 DIAGNOSIS — Z0000 Encounter for general adult medical examination without abnormal findings: Secondary | ICD-10-CM | POA: Diagnosis not present

## 2017-11-28 DIAGNOSIS — R7301 Impaired fasting glucose: Secondary | ICD-10-CM | POA: Diagnosis not present

## 2017-11-28 DIAGNOSIS — F411 Generalized anxiety disorder: Secondary | ICD-10-CM | POA: Diagnosis not present

## 2017-11-28 DIAGNOSIS — E871 Hypo-osmolality and hyponatremia: Secondary | ICD-10-CM | POA: Diagnosis not present

## 2017-11-30 DIAGNOSIS — I10 Essential (primary) hypertension: Secondary | ICD-10-CM | POA: Diagnosis not present

## 2017-11-30 DIAGNOSIS — M810 Age-related osteoporosis without current pathological fracture: Secondary | ICD-10-CM | POA: Diagnosis not present

## 2017-11-30 DIAGNOSIS — S72002D Fracture of unspecified part of neck of left femur, subsequent encounter for closed fracture with routine healing: Secondary | ICD-10-CM | POA: Diagnosis not present

## 2017-11-30 DIAGNOSIS — F419 Anxiety disorder, unspecified: Secondary | ICD-10-CM | POA: Diagnosis not present

## 2017-11-30 DIAGNOSIS — J449 Chronic obstructive pulmonary disease, unspecified: Secondary | ICD-10-CM | POA: Diagnosis not present

## 2017-11-30 DIAGNOSIS — F329 Major depressive disorder, single episode, unspecified: Secondary | ICD-10-CM | POA: Diagnosis not present

## 2017-12-01 DIAGNOSIS — S72002D Fracture of unspecified part of neck of left femur, subsequent encounter for closed fracture with routine healing: Secondary | ICD-10-CM | POA: Diagnosis not present

## 2017-12-01 DIAGNOSIS — F329 Major depressive disorder, single episode, unspecified: Secondary | ICD-10-CM | POA: Diagnosis not present

## 2017-12-01 DIAGNOSIS — M810 Age-related osteoporosis without current pathological fracture: Secondary | ICD-10-CM | POA: Diagnosis not present

## 2017-12-01 DIAGNOSIS — I10 Essential (primary) hypertension: Secondary | ICD-10-CM | POA: Diagnosis not present

## 2017-12-01 DIAGNOSIS — R269 Unspecified abnormalities of gait and mobility: Secondary | ICD-10-CM | POA: Diagnosis not present

## 2017-12-01 DIAGNOSIS — F17210 Nicotine dependence, cigarettes, uncomplicated: Secondary | ICD-10-CM | POA: Diagnosis not present

## 2017-12-01 DIAGNOSIS — J449 Chronic obstructive pulmonary disease, unspecified: Secondary | ICD-10-CM | POA: Diagnosis not present

## 2017-12-01 DIAGNOSIS — F419 Anxiety disorder, unspecified: Secondary | ICD-10-CM | POA: Diagnosis not present

## 2017-12-04 DIAGNOSIS — I10 Essential (primary) hypertension: Secondary | ICD-10-CM | POA: Diagnosis not present

## 2017-12-04 DIAGNOSIS — R269 Unspecified abnormalities of gait and mobility: Secondary | ICD-10-CM | POA: Diagnosis not present

## 2017-12-04 DIAGNOSIS — M810 Age-related osteoporosis without current pathological fracture: Secondary | ICD-10-CM | POA: Diagnosis not present

## 2017-12-04 DIAGNOSIS — J449 Chronic obstructive pulmonary disease, unspecified: Secondary | ICD-10-CM | POA: Diagnosis not present

## 2017-12-04 DIAGNOSIS — S72002D Fracture of unspecified part of neck of left femur, subsequent encounter for closed fracture with routine healing: Secondary | ICD-10-CM | POA: Diagnosis not present

## 2017-12-04 DIAGNOSIS — F329 Major depressive disorder, single episode, unspecified: Secondary | ICD-10-CM | POA: Diagnosis not present

## 2017-12-06 ENCOUNTER — Encounter: Payer: Self-pay | Admitting: Internal Medicine

## 2017-12-06 DIAGNOSIS — M810 Age-related osteoporosis without current pathological fracture: Secondary | ICD-10-CM | POA: Diagnosis not present

## 2017-12-06 DIAGNOSIS — S72002D Fracture of unspecified part of neck of left femur, subsequent encounter for closed fracture with routine healing: Secondary | ICD-10-CM | POA: Diagnosis not present

## 2017-12-06 DIAGNOSIS — J449 Chronic obstructive pulmonary disease, unspecified: Secondary | ICD-10-CM | POA: Diagnosis not present

## 2017-12-06 DIAGNOSIS — R269 Unspecified abnormalities of gait and mobility: Secondary | ICD-10-CM | POA: Diagnosis not present

## 2017-12-06 DIAGNOSIS — I10 Essential (primary) hypertension: Secondary | ICD-10-CM | POA: Diagnosis not present

## 2017-12-06 DIAGNOSIS — F329 Major depressive disorder, single episode, unspecified: Secondary | ICD-10-CM | POA: Diagnosis not present

## 2017-12-10 DIAGNOSIS — M810 Age-related osteoporosis without current pathological fracture: Secondary | ICD-10-CM | POA: Diagnosis not present

## 2017-12-10 DIAGNOSIS — J449 Chronic obstructive pulmonary disease, unspecified: Secondary | ICD-10-CM | POA: Diagnosis not present

## 2017-12-10 DIAGNOSIS — I10 Essential (primary) hypertension: Secondary | ICD-10-CM | POA: Diagnosis not present

## 2017-12-10 DIAGNOSIS — S72002D Fracture of unspecified part of neck of left femur, subsequent encounter for closed fracture with routine healing: Secondary | ICD-10-CM | POA: Diagnosis not present

## 2017-12-10 DIAGNOSIS — R269 Unspecified abnormalities of gait and mobility: Secondary | ICD-10-CM | POA: Diagnosis not present

## 2017-12-10 DIAGNOSIS — F329 Major depressive disorder, single episode, unspecified: Secondary | ICD-10-CM | POA: Diagnosis not present

## 2017-12-11 DIAGNOSIS — M1990 Unspecified osteoarthritis, unspecified site: Secondary | ICD-10-CM | POA: Diagnosis not present

## 2017-12-11 DIAGNOSIS — Z79899 Other long term (current) drug therapy: Secondary | ICD-10-CM | POA: Diagnosis not present

## 2017-12-13 DIAGNOSIS — F329 Major depressive disorder, single episode, unspecified: Secondary | ICD-10-CM | POA: Diagnosis not present

## 2017-12-13 DIAGNOSIS — M810 Age-related osteoporosis without current pathological fracture: Secondary | ICD-10-CM | POA: Diagnosis not present

## 2017-12-13 DIAGNOSIS — S72002D Fracture of unspecified part of neck of left femur, subsequent encounter for closed fracture with routine healing: Secondary | ICD-10-CM | POA: Diagnosis not present

## 2017-12-13 DIAGNOSIS — J449 Chronic obstructive pulmonary disease, unspecified: Secondary | ICD-10-CM | POA: Diagnosis not present

## 2017-12-13 DIAGNOSIS — I10 Essential (primary) hypertension: Secondary | ICD-10-CM | POA: Diagnosis not present

## 2017-12-13 DIAGNOSIS — R269 Unspecified abnormalities of gait and mobility: Secondary | ICD-10-CM | POA: Diagnosis not present

## 2017-12-18 DIAGNOSIS — I10 Essential (primary) hypertension: Secondary | ICD-10-CM | POA: Diagnosis not present

## 2017-12-18 DIAGNOSIS — F329 Major depressive disorder, single episode, unspecified: Secondary | ICD-10-CM | POA: Diagnosis not present

## 2017-12-18 DIAGNOSIS — S72002D Fracture of unspecified part of neck of left femur, subsequent encounter for closed fracture with routine healing: Secondary | ICD-10-CM | POA: Diagnosis not present

## 2017-12-18 DIAGNOSIS — J449 Chronic obstructive pulmonary disease, unspecified: Secondary | ICD-10-CM | POA: Diagnosis not present

## 2017-12-18 DIAGNOSIS — R269 Unspecified abnormalities of gait and mobility: Secondary | ICD-10-CM | POA: Diagnosis not present

## 2017-12-18 DIAGNOSIS — M810 Age-related osteoporosis without current pathological fracture: Secondary | ICD-10-CM | POA: Diagnosis not present

## 2017-12-20 DIAGNOSIS — M810 Age-related osteoporosis without current pathological fracture: Secondary | ICD-10-CM | POA: Diagnosis not present

## 2017-12-20 DIAGNOSIS — J449 Chronic obstructive pulmonary disease, unspecified: Secondary | ICD-10-CM | POA: Diagnosis not present

## 2017-12-20 DIAGNOSIS — S72002D Fracture of unspecified part of neck of left femur, subsequent encounter for closed fracture with routine healing: Secondary | ICD-10-CM | POA: Diagnosis not present

## 2017-12-20 DIAGNOSIS — R269 Unspecified abnormalities of gait and mobility: Secondary | ICD-10-CM | POA: Diagnosis not present

## 2017-12-20 DIAGNOSIS — F329 Major depressive disorder, single episode, unspecified: Secondary | ICD-10-CM | POA: Diagnosis not present

## 2017-12-20 DIAGNOSIS — I10 Essential (primary) hypertension: Secondary | ICD-10-CM | POA: Diagnosis not present

## 2017-12-25 DIAGNOSIS — S72002D Fracture of unspecified part of neck of left femur, subsequent encounter for closed fracture with routine healing: Secondary | ICD-10-CM | POA: Diagnosis not present

## 2017-12-25 DIAGNOSIS — F329 Major depressive disorder, single episode, unspecified: Secondary | ICD-10-CM | POA: Diagnosis not present

## 2017-12-25 DIAGNOSIS — I10 Essential (primary) hypertension: Secondary | ICD-10-CM | POA: Diagnosis not present

## 2017-12-25 DIAGNOSIS — J449 Chronic obstructive pulmonary disease, unspecified: Secondary | ICD-10-CM | POA: Diagnosis not present

## 2017-12-25 DIAGNOSIS — M810 Age-related osteoporosis without current pathological fracture: Secondary | ICD-10-CM | POA: Diagnosis not present

## 2017-12-25 DIAGNOSIS — R269 Unspecified abnormalities of gait and mobility: Secondary | ICD-10-CM | POA: Diagnosis not present

## 2017-12-27 DIAGNOSIS — M810 Age-related osteoporosis without current pathological fracture: Secondary | ICD-10-CM | POA: Diagnosis not present

## 2017-12-27 DIAGNOSIS — S72002D Fracture of unspecified part of neck of left femur, subsequent encounter for closed fracture with routine healing: Secondary | ICD-10-CM | POA: Diagnosis not present

## 2017-12-27 DIAGNOSIS — I10 Essential (primary) hypertension: Secondary | ICD-10-CM | POA: Diagnosis not present

## 2017-12-27 DIAGNOSIS — J449 Chronic obstructive pulmonary disease, unspecified: Secondary | ICD-10-CM | POA: Diagnosis not present

## 2017-12-27 DIAGNOSIS — R269 Unspecified abnormalities of gait and mobility: Secondary | ICD-10-CM | POA: Diagnosis not present

## 2017-12-27 DIAGNOSIS — F329 Major depressive disorder, single episode, unspecified: Secondary | ICD-10-CM | POA: Diagnosis not present

## 2017-12-31 DIAGNOSIS — R7301 Impaired fasting glucose: Secondary | ICD-10-CM | POA: Diagnosis not present

## 2017-12-31 DIAGNOSIS — F17210 Nicotine dependence, cigarettes, uncomplicated: Secondary | ICD-10-CM | POA: Diagnosis not present

## 2017-12-31 DIAGNOSIS — J449 Chronic obstructive pulmonary disease, unspecified: Secondary | ICD-10-CM | POA: Diagnosis not present

## 2017-12-31 DIAGNOSIS — Z79899 Other long term (current) drug therapy: Secondary | ICD-10-CM | POA: Diagnosis not present

## 2017-12-31 DIAGNOSIS — Z681 Body mass index (BMI) 19 or less, adult: Secondary | ICD-10-CM | POA: Diagnosis not present

## 2017-12-31 DIAGNOSIS — F411 Generalized anxiety disorder: Secondary | ICD-10-CM | POA: Diagnosis not present

## 2017-12-31 DIAGNOSIS — Z0000 Encounter for general adult medical examination without abnormal findings: Secondary | ICD-10-CM | POA: Diagnosis not present

## 2017-12-31 DIAGNOSIS — E871 Hypo-osmolality and hyponatremia: Secondary | ICD-10-CM | POA: Diagnosis not present

## 2018-01-01 DIAGNOSIS — R269 Unspecified abnormalities of gait and mobility: Secondary | ICD-10-CM | POA: Diagnosis not present

## 2018-01-01 DIAGNOSIS — M810 Age-related osteoporosis without current pathological fracture: Secondary | ICD-10-CM | POA: Diagnosis not present

## 2018-01-01 DIAGNOSIS — S72002D Fracture of unspecified part of neck of left femur, subsequent encounter for closed fracture with routine healing: Secondary | ICD-10-CM | POA: Diagnosis not present

## 2018-01-01 DIAGNOSIS — I10 Essential (primary) hypertension: Secondary | ICD-10-CM | POA: Diagnosis not present

## 2018-01-01 DIAGNOSIS — J449 Chronic obstructive pulmonary disease, unspecified: Secondary | ICD-10-CM | POA: Diagnosis not present

## 2018-01-01 DIAGNOSIS — F329 Major depressive disorder, single episode, unspecified: Secondary | ICD-10-CM | POA: Diagnosis not present

## 2018-01-04 DIAGNOSIS — F329 Major depressive disorder, single episode, unspecified: Secondary | ICD-10-CM | POA: Diagnosis not present

## 2018-01-04 DIAGNOSIS — J449 Chronic obstructive pulmonary disease, unspecified: Secondary | ICD-10-CM | POA: Diagnosis not present

## 2018-01-04 DIAGNOSIS — R269 Unspecified abnormalities of gait and mobility: Secondary | ICD-10-CM | POA: Diagnosis not present

## 2018-01-04 DIAGNOSIS — M810 Age-related osteoporosis without current pathological fracture: Secondary | ICD-10-CM | POA: Diagnosis not present

## 2018-01-04 DIAGNOSIS — S72002D Fracture of unspecified part of neck of left femur, subsequent encounter for closed fracture with routine healing: Secondary | ICD-10-CM | POA: Diagnosis not present

## 2018-01-04 DIAGNOSIS — I10 Essential (primary) hypertension: Secondary | ICD-10-CM | POA: Diagnosis not present

## 2018-01-07 DIAGNOSIS — R269 Unspecified abnormalities of gait and mobility: Secondary | ICD-10-CM | POA: Diagnosis not present

## 2018-01-07 DIAGNOSIS — I10 Essential (primary) hypertension: Secondary | ICD-10-CM | POA: Diagnosis not present

## 2018-01-07 DIAGNOSIS — S72002D Fracture of unspecified part of neck of left femur, subsequent encounter for closed fracture with routine healing: Secondary | ICD-10-CM | POA: Diagnosis not present

## 2018-01-07 DIAGNOSIS — F329 Major depressive disorder, single episode, unspecified: Secondary | ICD-10-CM | POA: Diagnosis not present

## 2018-01-07 DIAGNOSIS — M810 Age-related osteoporosis without current pathological fracture: Secondary | ICD-10-CM | POA: Diagnosis not present

## 2018-01-07 DIAGNOSIS — J449 Chronic obstructive pulmonary disease, unspecified: Secondary | ICD-10-CM | POA: Diagnosis not present

## 2018-01-10 DIAGNOSIS — M810 Age-related osteoporosis without current pathological fracture: Secondary | ICD-10-CM | POA: Diagnosis not present

## 2018-01-10 DIAGNOSIS — J449 Chronic obstructive pulmonary disease, unspecified: Secondary | ICD-10-CM | POA: Diagnosis not present

## 2018-01-10 DIAGNOSIS — I10 Essential (primary) hypertension: Secondary | ICD-10-CM | POA: Diagnosis not present

## 2018-01-10 DIAGNOSIS — S72002D Fracture of unspecified part of neck of left femur, subsequent encounter for closed fracture with routine healing: Secondary | ICD-10-CM | POA: Diagnosis not present

## 2018-01-10 DIAGNOSIS — F329 Major depressive disorder, single episode, unspecified: Secondary | ICD-10-CM | POA: Diagnosis not present

## 2018-01-10 DIAGNOSIS — R269 Unspecified abnormalities of gait and mobility: Secondary | ICD-10-CM | POA: Diagnosis not present

## 2018-01-11 DIAGNOSIS — G14 Postpolio syndrome: Secondary | ICD-10-CM | POA: Diagnosis not present

## 2018-01-11 DIAGNOSIS — M1990 Unspecified osteoarthritis, unspecified site: Secondary | ICD-10-CM | POA: Diagnosis not present

## 2018-01-11 DIAGNOSIS — Z79899 Other long term (current) drug therapy: Secondary | ICD-10-CM | POA: Diagnosis not present

## 2018-01-14 DIAGNOSIS — F5101 Primary insomnia: Secondary | ICD-10-CM | POA: Diagnosis not present

## 2018-01-14 DIAGNOSIS — F411 Generalized anxiety disorder: Secondary | ICD-10-CM | POA: Diagnosis not present

## 2018-01-14 DIAGNOSIS — H6120 Impacted cerumen, unspecified ear: Secondary | ICD-10-CM | POA: Diagnosis not present

## 2018-01-14 DIAGNOSIS — J449 Chronic obstructive pulmonary disease, unspecified: Secondary | ICD-10-CM | POA: Diagnosis not present

## 2018-01-15 ENCOUNTER — Other Ambulatory Visit: Payer: Self-pay | Admitting: Family Medicine

## 2018-01-15 DIAGNOSIS — S72002D Fracture of unspecified part of neck of left femur, subsequent encounter for closed fracture with routine healing: Secondary | ICD-10-CM | POA: Diagnosis not present

## 2018-01-15 DIAGNOSIS — R269 Unspecified abnormalities of gait and mobility: Secondary | ICD-10-CM | POA: Diagnosis not present

## 2018-01-15 DIAGNOSIS — I10 Essential (primary) hypertension: Secondary | ICD-10-CM | POA: Diagnosis not present

## 2018-01-15 DIAGNOSIS — M810 Age-related osteoporosis without current pathological fracture: Secondary | ICD-10-CM | POA: Diagnosis not present

## 2018-01-15 DIAGNOSIS — J449 Chronic obstructive pulmonary disease, unspecified: Secondary | ICD-10-CM | POA: Diagnosis not present

## 2018-01-15 DIAGNOSIS — F329 Major depressive disorder, single episode, unspecified: Secondary | ICD-10-CM | POA: Diagnosis not present

## 2018-01-17 DIAGNOSIS — R269 Unspecified abnormalities of gait and mobility: Secondary | ICD-10-CM | POA: Diagnosis not present

## 2018-01-17 DIAGNOSIS — F329 Major depressive disorder, single episode, unspecified: Secondary | ICD-10-CM | POA: Diagnosis not present

## 2018-01-17 DIAGNOSIS — M810 Age-related osteoporosis without current pathological fracture: Secondary | ICD-10-CM | POA: Diagnosis not present

## 2018-01-17 DIAGNOSIS — S72002D Fracture of unspecified part of neck of left femur, subsequent encounter for closed fracture with routine healing: Secondary | ICD-10-CM | POA: Diagnosis not present

## 2018-01-17 DIAGNOSIS — I10 Essential (primary) hypertension: Secondary | ICD-10-CM | POA: Diagnosis not present

## 2018-01-17 DIAGNOSIS — J449 Chronic obstructive pulmonary disease, unspecified: Secondary | ICD-10-CM | POA: Diagnosis not present

## 2018-01-22 DIAGNOSIS — M810 Age-related osteoporosis without current pathological fracture: Secondary | ICD-10-CM | POA: Diagnosis not present

## 2018-01-22 DIAGNOSIS — F329 Major depressive disorder, single episode, unspecified: Secondary | ICD-10-CM | POA: Diagnosis not present

## 2018-01-22 DIAGNOSIS — R269 Unspecified abnormalities of gait and mobility: Secondary | ICD-10-CM | POA: Diagnosis not present

## 2018-01-22 DIAGNOSIS — S72002D Fracture of unspecified part of neck of left femur, subsequent encounter for closed fracture with routine healing: Secondary | ICD-10-CM | POA: Diagnosis not present

## 2018-01-22 DIAGNOSIS — I10 Essential (primary) hypertension: Secondary | ICD-10-CM | POA: Diagnosis not present

## 2018-01-22 DIAGNOSIS — J449 Chronic obstructive pulmonary disease, unspecified: Secondary | ICD-10-CM | POA: Diagnosis not present

## 2018-01-31 ENCOUNTER — Other Ambulatory Visit: Payer: Self-pay

## 2018-01-31 ENCOUNTER — Ambulatory Visit (INDEPENDENT_AMBULATORY_CARE_PROVIDER_SITE_OTHER): Payer: Medicare Other | Admitting: Gastroenterology

## 2018-01-31 ENCOUNTER — Telehealth: Payer: Self-pay

## 2018-01-31 ENCOUNTER — Encounter: Payer: Self-pay | Admitting: Gastroenterology

## 2018-01-31 VITALS — BP 159/62 | HR 71 | Temp 97.0°F | Ht 65.0 in | Wt 108.8 lb

## 2018-01-31 DIAGNOSIS — Z86010 Personal history of colonic polyps: Secondary | ICD-10-CM

## 2018-01-31 DIAGNOSIS — K5900 Constipation, unspecified: Secondary | ICD-10-CM | POA: Diagnosis not present

## 2018-01-31 MED ORDER — NA SULFATE-K SULFATE-MG SULF 17.5-3.13-1.6 GM/177ML PO SOLN: 1.0000 | ORAL | 0 refills | Status: DC

## 2018-01-31 NOTE — Telephone Encounter (Signed)
Pt called office, informed of pre-op appt. 

## 2018-01-31 NOTE — Assessment & Plan Note (Signed)
Chronically with infrequent stools but sometimes with [place] 1 through 3.  Given upcoming colonoscopy, will be proactive to try to make sure she gets adequately prepped.  She will begin Linzess 145 mcg daily on empty stomach 1 week prior to her procedure.  She can hold for diarrhea.  Colonoscopy in the near future with deep sedation.  I have discussed the risks, alternatives, benefits with regards to but not limited to the risk of reaction to medication, bleeding, infection, perforation and the patient is agreeable to proceed. Written consent to be obtained.

## 2018-01-31 NOTE — Progress Notes (Signed)
cc'ed to pcp °

## 2018-01-31 NOTE — Patient Instructions (Addendum)
1. One week prior to your colonoscopy, start Linzess 141mcg daily. Samples provided for that purpose.  2. Colonoscopy as scheduled. See separate instructions.  3. Your Granddaughter will need to check her insurance before making her appointment to be seen here. If she has the "Focused" plan, she will need referral. If she has the regular plan, she can make her own appointment.

## 2018-01-31 NOTE — Assessment & Plan Note (Signed)
Overdue for surveillance colonoscopy.  See assessment and plan for constipation.

## 2018-01-31 NOTE — Telephone Encounter (Signed)
Tried to call pt to inform of pre-op appt 03/19/18 at 10:00am, no answer, LMOVM. Letter mailed.
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
## 2018-02-12 DIAGNOSIS — Z79899 Other long term (current) drug therapy: Secondary | ICD-10-CM | POA: Diagnosis not present

## 2018-02-12 DIAGNOSIS — M1990 Unspecified osteoarthritis, unspecified site: Secondary | ICD-10-CM | POA: Diagnosis not present

## 2018-02-26 DIAGNOSIS — R7301 Impaired fasting glucose: Secondary | ICD-10-CM | POA: Diagnosis not present

## 2018-02-26 DIAGNOSIS — Z79899 Other long term (current) drug therapy: Secondary | ICD-10-CM | POA: Diagnosis not present

## 2018-02-28 DIAGNOSIS — F17210 Nicotine dependence, cigarettes, uncomplicated: Secondary | ICD-10-CM | POA: Diagnosis not present

## 2018-02-28 DIAGNOSIS — J449 Chronic obstructive pulmonary disease, unspecified: Secondary | ICD-10-CM | POA: Diagnosis not present

## 2018-02-28 DIAGNOSIS — S72002D Fracture of unspecified part of neck of left femur, subsequent encounter for closed fracture with routine healing: Secondary | ICD-10-CM | POA: Diagnosis not present

## 2018-02-28 DIAGNOSIS — E559 Vitamin D deficiency, unspecified: Secondary | ICD-10-CM | POA: Diagnosis not present

## 2018-02-28 DIAGNOSIS — R7301 Impaired fasting glucose: Secondary | ICD-10-CM | POA: Diagnosis not present

## 2018-02-28 DIAGNOSIS — F419 Anxiety disorder, unspecified: Secondary | ICD-10-CM | POA: Diagnosis not present

## 2018-02-28 DIAGNOSIS — E871 Hypo-osmolality and hyponatremia: Secondary | ICD-10-CM | POA: Diagnosis not present

## 2018-02-28 DIAGNOSIS — Z681 Body mass index (BMI) 19 or less, adult: Secondary | ICD-10-CM | POA: Diagnosis not present

## 2018-03-14 NOTE — Patient Instructions (Signed)
Amy Rubio  03/14/2018     @PREFPERIOPPHARMACY @   Your procedure is scheduled on  03/25/2018   Report to [facility] at  61   A.M.  Call this number if you have problems the morning of surgery:  [phone]   Remember:  Do not eat or drink after midnight.  You may drink clear liquids until  (see instructions given to you) .  Clear liquids allowed are:                    Water, Juice (non-citric and without pulp), Carbonated beverages, Clear Tea, Black Coffee only, Plain Jell-O only, Gatorade and Plain Popsicles only    Take these medicines the morning of surgery with A SIP OF WATER  Xanax, prozac, lisinopril, prilosec, oxycodoen, lyrica. Use your inhalers before you come.    Do not wear jewelry, make-up or nail polish.  Do not wear lotions, powders, or perfumes, or deodorant.  Do not shave 48 hours prior to surgery.  Men may shave face and neck.  Do not bring valuables to the hospital.  [facility] is not responsible for any belongings or valuables.  Contacts, dentures or bridgework may not be worn into surgery.  Leave your suitcase in the car.  After surgery it may be brought to your room.  For patients admitted to the hospital, discharge time will be determined by your treatment team.  Patients discharged the day of surgery will not be allowed to drive home.   Name and phone number of your driver:   family Special instructions:  Follow the diet and prep instructions given to you by Dr Roseanne Kaufman office.  Please read over the following fact sheets that you were given. Anesthesia Post-op Instructions and Care and Recovery After Surgery       Colonoscopy, Adult A colonoscopy is an exam to look at the large intestine. It is done to check for problems, such as:  Lumps (tumors).  Growths (polyps).  Swelling (inflammation).  Bleeding.  What happens before the procedure? Eating and drinking Follow instructions from your doctor about eating and drinking.  These instructions may include:  A few days before the procedure - follow a low-fiber diet. ? Avoid nuts. ? Avoid seeds. ? Avoid dried fruit. ? Avoid raw fruits. ? Avoid vegetables.  1-3 days before the procedure - follow a clear liquid diet. Avoid liquids that have red or purple dye. Drink only clear liquids, such as: ? Clear broth or bouillon. ? Black coffee or tea. ? Clear juice. ? Clear soft drinks or sports drinks. ? Gelatin dessert. ? Popsicles.  On the day of the procedure - do not eat or drink anything during the 2 hours before the procedure.  Bowel prep If you were prescribed an oral bowel prep:  Take it as told by your doctor. Starting the day before your procedure, you will need to drink a lot of liquid. The liquid will cause you to poop (have bowel movements) until your poop is almost clear or light green.  If your skin or butt gets irritated from diarrhea, you may: ? Wipe the area with wipes that have medicine in them, such as adult wet wipes with aloe and vitamin E. ? Put something on your skin that soothes the area, such as petroleum jelly.  If you throw up (vomit) while drinking the bowel prep, take a break for up to 60 minutes. Then begin  the bowel prep again. If you keep throwing up and you cannot take the bowel prep without throwing up, call your doctor.  General instructions  Ask your doctor about changing or stopping your normal medicines. This is important if you take diabetes medicines or blood thinners.  Plan to have someone take you home from the hospital or clinic. What happens during the procedure?  An IV tube may be put into one of your veins.  You will be given medicine to help you relax (sedative).  To reduce your risk of infection: ? Your doctors will wash their hands. ? Your anal area will be washed with soap.  You will be asked to lie on your side with your knees bent.  Your doctor will get a long, thin, flexible tube ready. The tube  will have a camera and a light on the end.  The tube will be put into your anus.  The tube will be gently put into your large intestine.  Air will be delivered into your large intestine to keep it open. You may feel some pressure or cramping.  The camera will be used to take photos.  A small tissue sample may be removed from your body to be looked at under a microscope (biopsy). If any possible problems are found, the tissue will be sent to a lab for testing.  If small growths are found, your doctor may remove them and have them checked for cancer.  The tube that was put into your anus will be slowly removed. The procedure may vary among doctors and hospitals. What happens after the procedure?  Your doctor will check on you often until the medicines you were given have worn off.  Do not drive for 24 hours after the procedure.  You may have a small amount of blood in your poop.  You may pass gas.  You may have mild cramps or bloating in your belly (abdomen).  It is up to you to get the results of your procedure. Ask your doctor, or the department performing the procedure, when your results will be ready. This information is not intended to replace advice given to you by your health care provider. Make sure you discuss any questions you have with your health care provider. Document Released: 10/21/2010 Document Revised: 07/19/2016 Document Reviewed: 11/30/2015 Elsevier Interactive Patient Education  2017 [employer]  Colonoscopy, Adult, Care After This sheet gives you information about how to care for yourself after your procedure. Your health care provider may also give you more specific instructions. If you have problems or questions, contact your health care provider. What can I expect after the procedure? After the procedure, it is common to have:  A small amount of blood in your stool for 24 hours after the procedure.  Some gas.  Mild abdominal cramping or  bloating.  Follow these instructions at home: General instructions   For the first 24 hours after the procedure: ? Do not drive or use machinery. ? Do not sign important documents. ? Do not drink alcohol. ? Do your regular daily activities at a slower pace than normal. ? Eat soft, easy-to-digest foods. ? Rest often.  Take over-the-counter or prescription medicines only as told by your health care provider.  It is up to you to get the results of your procedure. Ask your health care provider, or the department performing the procedure, when your results will be ready. Relieving cramping and bloating  Try walking around when you have cramps or  feel bloated.  Apply heat to your abdomen as told by your health care provider. Use a heat source that your health care provider recommends, such as a moist heat pack or a heating pad. ? Place a towel between your skin and the heat source. ? Leave the heat on for 20-30 minutes. ? Remove the heat if your skin turns bright red. This is especially important if you are unable to feel pain, heat, or cold. You may have a greater risk of getting burned. Eating and drinking  Drink enough fluid to keep your urine clear or pale yellow.  Resume your normal diet as instructed by your health care provider. Avoid heavy or fried foods that are hard to digest.  Avoid drinking alcohol for as long as instructed by your health care provider. Contact a health care provider if:  You have blood in your stool 2-3 days after the procedure. Get help right away if:  You have more than a small spotting of blood in your stool.  You pass large blood clots in your stool.  Your abdomen is swollen.  You have nausea or vomiting.  You have a fever.  You have increasing abdominal pain that is not relieved with medicine. This information is not intended to replace advice given to you by your health care provider. Make sure you discuss any questions you have with your  health care provider. Document Released: 05/02/2004 Document Revised: 06/12/2016 Document Reviewed: 11/30/2015 Elsevier Interactive Patient Education  2018 [place] Anesthesia is a term that refers to techniques, procedures, and medicines that help a person stay safe and comfortable during a medical procedure. Monitored anesthesia care, or sedation, is one type of anesthesia. Your anesthesia specialist may recommend sedation if you will be having a procedure that does not require you to be unconscious, such as:  Cataract surgery.  A dental procedure.  A biopsy.  A colonoscopy.  During the procedure, you may receive a medicine to help you relax (sedative). There are three levels of sedation:  Mild sedation. At this level, you may feel awake and relaxed. You will be able to follow directions.  Moderate sedation. At this level, you will be sleepy. You may not remember the procedure.  Deep sedation. At this level, you will be asleep. You will not remember the procedure.  The more medicine you are given, the deeper your level of sedation will be. Depending on how you respond to the procedure, the anesthesia specialist may change your level of sedation or the type of anesthesia to fit your needs. An anesthesia specialist will monitor you closely during the procedure. Let your health care provider know about:  Any allergies you have.  All medicines you are taking, including vitamins, herbs, eye drops, creams, and over-the-counter medicines.  Any use of steroids (by mouth or as a cream).  Any problems you or family members have had with sedatives and anesthetic medicines.  Any blood disorders you have.  Any surgeries you have had.  Any medical conditions you have, such as sleep apnea.  Whether you are pregnant or may be pregnant.  Any use of cigarettes, alcohol, or street drugs. What are the risks? Generally, this is a safe procedure. However,  problems may occur, including:  Getting too much medicine (oversedation).  Nausea.  Allergic reaction to medicines.  Trouble breathing. If this happens, a breathing tube may be used to help with breathing. It will be removed when you are awake and breathing  on your own.  Heart trouble.  Lung trouble.  Before the procedure Staying hydrated Follow instructions from your health care provider about hydration, which may include:  Up to 2 hours before the procedure - you may continue to drink clear liquids, such as water, clear fruit juice, black coffee, and plain tea.  Eating and drinking restrictions Follow instructions from your health care provider about eating and drinking, which may include:  8 hours before the procedure - stop eating heavy meals or foods such as meat, fried foods, or fatty foods.  6 hours before the procedure - stop eating light meals or foods, such as toast or cereal.  6 hours before the procedure - stop drinking milk or drinks that contain milk.  2 hours before the procedure - stop drinking clear liquids.  Medicines Ask your health care provider about:  Changing or stopping your regular medicines. This is especially important if you are taking diabetes medicines or blood thinners.  Taking medicines such as aspirin and ibuprofen. These medicines can thin your blood. Do not take these medicines before your procedure if your health care provider instructs you not to.  Tests and exams  You will have a physical exam.  You may have blood tests done to show: ? How well your kidneys and liver are working. ? How well your blood can clot.  General instructions  Plan to have someone take you home from the hospital or clinic.  If you will be going home right after the procedure, plan to have someone with you for 24 hours.  What happens during the procedure?  Your blood pressure, heart rate, breathing, level of pain and overall condition will be  monitored.  An IV tube will be inserted into one of your veins.  Your anesthesia specialist will give you medicines as needed to keep you comfortable during the procedure. This may mean changing the level of sedation.  The procedure will be performed. After the procedure  Your blood pressure, heart rate, breathing rate, and blood oxygen level will be monitored until the medicines you were given have worn off.  Do not drive for 24 hours if you received a sedative.  You may: ? Feel sleepy, clumsy, or nauseous. ? Feel forgetful about what happened after the procedure. ? Have a sore throat if you had a breathing tube during the procedure. ? Vomit. This information is not intended to replace advice given to you by your health care provider. Make sure you discuss any questions you have with your health care provider. Document Released: 06/14/2005 Document Revised: 02/25/2016 Document Reviewed: 01/09/2016 Elsevier Interactive Patient Education  2018 [place], Care After These instructions provide you with information about caring for yourself after your procedure. Your health care provider may also give you more specific instructions. Your treatment has been planned according to current medical practices, but problems sometimes occur. Call your health care provider if you have any problems or questions after your procedure. What can I expect after the procedure? After your procedure, it is common to:  Feel sleepy for several hours.  Feel clumsy and have poor balance for several hours.  Feel forgetful about what happened after the procedure.  Have poor judgment for several hours.  Feel nauseous or vomit.  Have a sore throat if you had a breathing tube during the procedure.  Follow these instructions at home: For at least 24 hours after the procedure:   Do not: ? Participate in activities in  which you could fall or become injured. ? Drive. ? Use  heavy machinery. ? Drink alcohol. ? Take sleeping pills or medicines that cause drowsiness. ? Make important decisions or sign legal documents. ? Take care of children on your own.  Rest. Eating and drinking  Follow the diet that is recommended by your health care provider.  If you vomit, drink water, juice, or soup when you can drink without vomiting.  Make sure you have little or no nausea before eating solid foods. General instructions  Have a responsible adult stay with you until you are awake and alert.  Take over-the-counter and prescription medicines only as told by your health care provider.  If you smoke, do not smoke without supervision.  Keep all follow-up visits as told by your health care provider. This is important. Contact a health care provider if:  You keep feeling nauseous or you keep vomiting.  You feel light-headed.  You develop a rash.  You have a fever. Get help right away if:  You have trouble breathing. This information is not intended to replace advice given to you by your health care provider. Make sure you discuss any questions you have with your health care provider. Document Released: 01/09/2016 Document Revised: 05/10/2016 Document Reviewed: 01/09/2016 Elsevier Interactive Patient Education  [employer].

## 2018-03-19 ENCOUNTER — Encounter (HOSPITAL_COMMUNITY)
Admission: RE | Admit: 2018-03-19 | Discharge: 2018-03-19 | Disposition: A | Discharge status: Home / Self Care | Payer: Medicare Other | Source: Ambulatory Visit | Attending: Internal Medicine | Admitting: Internal Medicine

## 2018-03-19 ENCOUNTER — Other Ambulatory Visit: Payer: Self-pay

## 2018-03-19 ENCOUNTER — Encounter (HOSPITAL_COMMUNITY): Payer: Self-pay

## 2018-03-19 DIAGNOSIS — Z01812 Encounter for preprocedural laboratory examination: Secondary | ICD-10-CM | POA: Insufficient documentation

## 2018-03-19 HISTORY — DX: Fibromyalgia: M79.7

## 2018-03-19 HISTORY — DX: Depression, unspecified: F32.A

## 2018-03-19 HISTORY — DX: Unspecified osteoarthritis, unspecified site: M19.90

## 2018-03-19 HISTORY — DX: Dyspnea, unspecified: R06.00

## 2018-03-19 HISTORY — DX: Major depressive disorder, single episode, unspecified: F32.9

## 2018-03-19 HISTORY — DX: Postpolio syndrome: G14

## 2018-03-19 HISTORY — DX: Cardiac murmur, unspecified: R01.1

## 2018-03-19 HISTORY — DX: Gastro-esophageal reflux disease without esophagitis: K21.9

## 2018-03-19 LAB — BASIC METABOLIC PANEL
ANION GAP: 9 (ref 5–15)
BUN: 11 mg/dL (ref 6–20)
CHLORIDE: 100 mmol/L — AB (ref 101–111)
CO2: 27 mmol/L (ref 22–32)
Calcium: 8.9 mg/dL (ref 8.9–10.3)
Creatinine, Ser: 0.77 mg/dL (ref 0.44–1.00)
GFR calc Af Amer: >60 mL/min (ref >60)
GFR calc non Af Amer: >60 mL/min (ref >60)
GLUCOSE: 115 mg/dL — AB (ref 65–99)
POTASSIUM: 3.4 mmol/L — AB (ref 3.5–5.1)
Sodium: 136 mmol/L (ref 135–145)

## 2018-03-19 LAB — CBC WITH DIFFERENTIAL/PLATELET
BASOS ABS: 0 10*3/uL (ref 0.0–0.1)
Basophils Relative: 1 %
EOS PCT: 4 %
Eosinophils Absolute: 0.2 10*3/uL (ref 0.0–0.7)
HEMATOCRIT: 38.5 % (ref 36.0–46.0)
HEMOGLOBIN: 12.5 g/dL (ref 12.0–15.0)
LYMPHS ABS: 1.5 10*3/uL (ref 0.7–4.0)
LYMPHS PCT: 26 %
MCH: 31.6 pg (ref 26.0–34.0)
MCHC: 32.5 g/dL (ref 30.0–36.0)
MCV: 97.5 fL (ref 78.0–100.0)
Monocytes Absolute: 0.5 10*3/uL (ref 0.1–1.0)
Monocytes Relative: 9 %
NEUTROS ABS: 3.5 10*3/uL (ref 1.7–7.7)
Neutrophils Relative %: 60 %
PLATELETS: 327 10*3/uL (ref 150–400)
RBC: 3.95 MIL/uL (ref 3.87–5.11)
RDW: 15.8 % — ABNORMAL HIGH (ref 11.5–15.5)
WBC: 5.8 10*3/uL (ref 4.0–10.5)

## 2018-03-22 DIAGNOSIS — Z79899 Other long term (current) drug therapy: Secondary | ICD-10-CM | POA: Diagnosis not present

## 2018-03-22 DIAGNOSIS — M1990 Unspecified osteoarthritis, unspecified site: Secondary | ICD-10-CM | POA: Diagnosis not present

## 2018-03-25 ENCOUNTER — Ambulatory Visit (HOSPITAL_COMMUNITY)
Admission: RE | Admit: 2018-03-25 | Discharge: 2018-03-25 | Disposition: A | Discharge status: Home / Self Care | Payer: Medicare Other | Source: Ambulatory Visit | Attending: Internal Medicine | Admitting: Internal Medicine

## 2018-03-25 ENCOUNTER — Ambulatory Visit (HOSPITAL_COMMUNITY): Payer: Medicare Other | Admitting: Anesthesiology

## 2018-03-25 ENCOUNTER — Other Ambulatory Visit: Payer: Self-pay

## 2018-03-25 ENCOUNTER — Encounter (HOSPITAL_COMMUNITY)
Admission: RE | Disposition: A | Discharge status: Home / Self Care | Payer: Self-pay | Source: Ambulatory Visit | Attending: Internal Medicine

## 2018-03-25 ENCOUNTER — Encounter (HOSPITAL_COMMUNITY): Payer: Self-pay | Admitting: *Deleted

## 2018-03-25 DIAGNOSIS — Z8719 Personal history of other diseases of the digestive system: Secondary | ICD-10-CM | POA: Insufficient documentation

## 2018-03-25 DIAGNOSIS — K621 Rectal polyp: Secondary | ICD-10-CM | POA: Insufficient documentation

## 2018-03-25 DIAGNOSIS — D122 Benign neoplasm of ascending colon: Secondary | ICD-10-CM | POA: Diagnosis not present

## 2018-03-25 DIAGNOSIS — D125 Benign neoplasm of sigmoid colon: Secondary | ICD-10-CM | POA: Insufficient documentation

## 2018-03-25 DIAGNOSIS — Z1211 Encounter for screening for malignant neoplasm of colon: Secondary | ICD-10-CM | POA: Diagnosis not present

## 2018-03-25 DIAGNOSIS — K6389 Other specified diseases of intestine: Secondary | ICD-10-CM | POA: Insufficient documentation

## 2018-03-25 DIAGNOSIS — F17210 Nicotine dependence, cigarettes, uncomplicated: Secondary | ICD-10-CM | POA: Insufficient documentation

## 2018-03-25 DIAGNOSIS — Z86010 Personal history of colonic polyps: Secondary | ICD-10-CM | POA: Diagnosis not present

## 2018-03-25 DIAGNOSIS — D120 Benign neoplasm of cecum: Secondary | ICD-10-CM | POA: Diagnosis not present

## 2018-03-25 DIAGNOSIS — K219 Gastro-esophageal reflux disease without esophagitis: Secondary | ICD-10-CM | POA: Diagnosis not present

## 2018-03-25 DIAGNOSIS — F419 Anxiety disorder, unspecified: Secondary | ICD-10-CM | POA: Diagnosis not present

## 2018-03-25 DIAGNOSIS — I10 Essential (primary) hypertension: Secondary | ICD-10-CM | POA: Diagnosis not present

## 2018-03-25 DIAGNOSIS — D127 Benign neoplasm of rectosigmoid junction: Secondary | ICD-10-CM | POA: Diagnosis not present

## 2018-03-25 DIAGNOSIS — Z79899 Other long term (current) drug therapy: Secondary | ICD-10-CM | POA: Insufficient documentation

## 2018-03-25 DIAGNOSIS — J449 Chronic obstructive pulmonary disease, unspecified: Secondary | ICD-10-CM | POA: Diagnosis not present

## 2018-03-25 DIAGNOSIS — M797 Fibromyalgia: Secondary | ICD-10-CM | POA: Diagnosis not present

## 2018-03-25 DIAGNOSIS — K5730 Diverticulosis of large intestine without perforation or abscess without bleeding: Secondary | ICD-10-CM | POA: Insufficient documentation

## 2018-03-25 DIAGNOSIS — F329 Major depressive disorder, single episode, unspecified: Secondary | ICD-10-CM | POA: Diagnosis not present

## 2018-03-25 HISTORY — PX: POLYPECTOMY: SHX5525

## 2018-03-25 HISTORY — PX: COLONOSCOPY WITH PROPOFOL: SHX5780

## 2018-03-25 SURGERY — COLONOSCOPY WITH PROPOFOL
Anesthesia: Monitor Anesthesia Care

## 2018-03-25 MED ORDER — CHLORHEXIDINE GLUCONATE CLOTH 2 % EX PADS: 6.0000 | MEDICATED_PAD | Freq: Once | CUTANEOUS | Status: DC

## 2018-03-25 MED ORDER — EPHEDRINE SULFATE 50 MG/ML IJ SOLN
INTRAMUSCULAR | Status: AC
  Filled 2018-03-25: qty 1

## 2018-03-25 MED ORDER — SODIUM CHLORIDE 0.9 % IJ SOLN
INTRAMUSCULAR | Status: AC
  Filled 2018-03-25: qty 10

## 2018-03-25 MED ORDER — EPHEDRINE SULFATE 50 MG/ML IJ SOLN
INTRAMUSCULAR | Status: DC | PRN
  Administered 2018-03-25: 5 mg via INTRAVENOUS

## 2018-03-25 MED ORDER — MIDAZOLAM HCL 2 MG/2ML IJ SOLN
INTRAMUSCULAR | Status: AC
  Filled 2018-03-25: qty 2

## 2018-03-25 MED ORDER — MIDAZOLAM HCL 5 MG/5ML IJ SOLN
INTRAMUSCULAR | Status: DC | PRN
  Administered 2018-03-25: 1 mg via INTRAVENOUS

## 2018-03-25 MED ORDER — LACTATED RINGERS IV SOLN: INTRAVENOUS | Status: DC

## 2018-03-25 MED ORDER — LACTATED RINGERS IV SOLN
INTRAVENOUS | Status: DC | PRN
  Administered 2018-03-25: 09:00:00 via INTRAVENOUS

## 2018-03-25 MED ORDER — PROPOFOL 10 MG/ML IV BOLUS
INTRAVENOUS | Status: DC | PRN
  Administered 2018-03-25: 10 mg via INTRAVENOUS
  Administered 2018-03-25 (×2): 20 mg via INTRAVENOUS

## 2018-03-25 MED ORDER — PROPOFOL 500 MG/50ML IV EMUL
INTRAVENOUS | Status: DC | PRN
  Administered 2018-03-25: 150 ug/kg/min via INTRAVENOUS
  Administered 2018-03-25: 10:00:00 via INTRAVENOUS

## 2018-03-25 NOTE — Op Note (Signed)
[facility] Patient Name: Jandy Brackens Procedure Date: 03/25/2018 9:16 AM MRN: [id number] Date of Birth: 10/23/1950 Attending MD: Norvel Richards , MD CSN: [id number] Age: 67 Admit Type: Outpatient Procedure:                Colonoscopy Indications:              High risk colon cancer surveillance: Personal                            history of colonic polyps Providers:                Norvel Richards, MD, Janeece Riggers, RN, Lurline Del, RN Referring MD:              Medicines:                Propofol per Anesthesia Complications:            No immediate complications. Estimated Blood Loss:     Estimated blood loss was minimal. Melanosis coli Procedure:                Pre-Anesthesia Assessment:                           - Prior to the procedure, a History and Physical                            was performed, and patient medications and                            allergies were reviewed. The patient's tolerance of                            previous anesthesia was also reviewed. The risks                            and benefits of the procedure and the sedation                            options and risks were discussed with the patient.                            All questions were answered, and informed consent                            was obtained. Prior Anticoagulants: The patient has                            taken no previous anticoagulant or antiplatelet                            agents. ASA Grade Assessment: III - A patient with  severe systemic disease. After reviewing the risks                            and benefits, the patient was deemed in                            satisfactory condition to undergo the procedure.                           After obtaining informed consent, the colonoscope                            was passed under direct vision. Throughout the                            procedure, the  patient's blood pressure, pulse, and                            oxygen saturations were monitored continuously. The                            EC-[id number] ([id number]) scope was introduced through                            the and advanced to the the cecum, identified by                            appendiceal orifice and ileocecal valve. The                            colonoscopy was performed without difficulty. The                            patient tolerated the procedure well. The quality                            of the bowel preparation was adequate. The entire                            colon was well visualized. Scope In: 9:38:55 AM Scope Out: 10:15:37 AM Scope Withdrawal Time: 0 hours 28 minutes 14 seconds  Total Procedure Duration: 0 hours 36 minutes 42 seconds  Findings:      The perianal and digital rectal examinations were normal. Melanosis coli       present.      14 semi-pedunculated polyps were found in the rectum, recto-sigmoid       colon, sigmoid colon, ascending colon and cecum. 3?"8 millimeters in       size. These polyps were removed with a cold snare. Resection and       retrieval were complete. Estimated blood loss was minimal.      The exam was otherwise normal without abnormality on direct and       retroflexion views.      A few small-mouthed diverticula were found in the entire colon. Impression:               -  14 polyps in the rectum, at the recto-sigmoid                            colon, in the sigmoid colon, in the ascending colon                            and in the cecum, removed with a cold snare.                            Resected and retrieved.                           - The examination was otherwise normal on direct                            and retroflexion views.                           - Diverticulosis in the entire examined colon. Moderate Sedation:      Moderate (conscious) sedation was personally administered by an       anesthesia  professional. The following parameters were monitored: oxygen       saturation, heart rate, blood pressure, respiratory rate, EKG, adequacy       of pulmonary ventilation, and response to care. Total physician       intraservice time was 41 minutes. Recommendation:           - Written discharge instructions were provided to                            the patient. Procedure Code(s):        --- Professional ---                           [phone], Colonoscopy, flexible; with removal of                            tumor(s), polyp(s), or other lesion(s) by snare                            technique Diagnosis Code(s):        --- Professional ---                           Z86.010, Personal history of colonic polyps                           K62.1, Rectal polyp                           D12.7, Benign neoplasm of rectosigmoid junction                           D12.5, Benign neoplasm of sigmoid colon                           D12.2, Benign neoplasm of ascending colon  D12.0, Benign neoplasm of cecum                           K57.30, Diverticulosis of large intestine without                            perforation or abscess without bleeding CPT copyright 2017 American Medical Association. All rights reserved. The codes documented in this report are preliminary and upon coder review may  be revised to meet current compliance requirements. Cristopher Estimable. Kynnedi Zweig, MD Norvel Richards, MD 03/25/2018 10:23:52 AM This report has been signed electronically. Number of Addenda: 0

## 2018-03-25 NOTE — Anesthesia Postprocedure Evaluation (Signed)
Anesthesia Post Note  Patient: Amy Rubio  Procedure(s) Performed: COLONOSCOPY WITH PROPOFOL (N/A ) POLYPECTOMY  Patient location during evaluation: PACU Anesthesia Type: MAC Level of consciousness: awake and alert and oriented Pain management: pain level controlled Vital Signs Assessment: post-procedure vital signs reviewed and stable Respiratory status: spontaneous breathing Cardiovascular status: blood pressure returned to baseline Postop Assessment: no apparent nausea or vomiting Anesthetic complications: no     Last Vitals:  Vitals:   03/25/18 0845  BP: (!) 170/65  Pulse: (!) 51  Resp: 20  Temp: 36.8 C  SpO2: 93%    Last Pain:  Vitals:   03/25/18 0845  TempSrc: Oral  PainSc: 0-No pain                 Keyuna Cuthrell

## 2018-03-25 NOTE — Transfer of Care (Signed)
Immediate Anesthesia Transfer of Care Note  Patient: Amy Rubio  Procedure(s) Performed: COLONOSCOPY WITH PROPOFOL (N/A ) POLYPECTOMY  Patient Location: PACU  Anesthesia Type:MAC  Level of Consciousness: awake, alert  and oriented  Airway & Oxygen Therapy: Patient Spontanous Breathing  Post-op Assessment: Report given to RN  Post vital signs: Reviewed and stable  Last Vitals:  Vitals Value Taken Time  BP 152/126 03/25/2018 10:22 AM  Temp    Pulse 30 03/25/2018 10:23 AM  Resp 16 03/25/2018 10:23 AM  SpO2 88 % 03/25/2018 10:23 AM  Vitals shown include unvalidated device data.  Last Pain:  Vitals:   03/25/18 0845  TempSrc: Oral  PainSc: 0-No pain      Patients Stated Pain Goal: 5 (53/29/92 4268)  Complications: No apparent anesthesia complications

## 2018-03-25 NOTE — Anesthesia Preprocedure Evaluation (Signed)
Anesthesia Evaluation  Patient identified by MRN, date of birth, ID band Patient awake    Reviewed: Allergy & Precautions, H&P , NPO status , Patient's Chart, lab work & pertinent test results, reviewed documented beta blocker date and time   Airway Mallampati: II  TM Distance: >3 FB Neck ROM: full    Dental no notable dental hx.    Pulmonary neg pulmonary ROS, shortness of breath, COPD,  COPD inhaler, Current Smoker,    Pulmonary exam normal breath sounds clear to auscultation       Cardiovascular Exercise Tolerance: Good hypertension, + Peripheral Vascular Disease  negative cardio ROS  + Valvular Problems/Murmurs  Rhythm:regular Rate:Normal     Neuro/Psych  Headaches, PSYCHIATRIC DISORDERS Anxiety Depression  Neuromuscular disease negative neurological ROS  negative psych ROS   GI/Hepatic negative GI ROS, Neg liver ROS, GERD  ,  Endo/Other  negative endocrine ROS  Renal/GU negative Renal ROS  negative genitourinary   Musculoskeletal   Abdominal   Peds  Hematology negative hematology ROS (+)   Anesthesia Other Findings   Reproductive/Obstetrics negative OB ROS                             Anesthesia Physical Anesthesia Plan  ASA: III  Anesthesia Plan: MAC   Post-op Pain Management:    Induction:   PONV Risk Score and Plan:   Airway Management Planned:   Additional Equipment:   Intra-op Plan:   Post-operative Plan:   Informed Consent: I have reviewed the patients History and Physical, chart, labs and discussed the procedure including the risks, benefits and alternatives for the proposed anesthesia with the patient or authorized representative who has indicated his/her understanding and acceptance.   Dental Advisory Given  Plan Discussed with: CRNA  Anesthesia Plan Comments:         Anesthesia Quick Evaluation
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
## 2018-03-25 NOTE — Discharge Instructions (Signed)
Colonoscopy Discharge Instructions  Read the instructions outlined below and refer to this sheet in the next few weeks. These discharge instructions provide you with general information on caring for yourself after you leave the hospital. Your doctor may also give you specific instructions. While your treatment has been planned according to the most current medical practices available, unavoidable complications occasionally occur. If you have any problems or questions after discharge, call Dr. Gala Romney at [phone].  After hours and weekends, call the hospital and have the GI doctor on call paged.  They will call you back. ACTIVITY  You may resume your regular activity, but move at a slower pace for the next 24 hours.   Take frequent rest periods for the next 24 hours.   Walking will help get rid of the air and reduce the bloated feeling in your belly (abdomen).   No driving for 24 hours (because of the medicine (anesthesia) used during the test).    Do not sign any important legal documents or operate any machinery for 24 hours (because of the anesthesia used during the test).  NUTRITION  Drink plenty of fluids.   You may resume your normal diet as instructed by your doctor.   Begin with a light meal and progress to your normal diet. Heavy or fried foods are harder to digest and may make you feel sick to your stomach (nauseated).   Avoid alcoholic beverages for 24 hours or as instructed.  MEDICATIONS  You may resume your normal medications unless your doctor tells you otherwise.  WHAT YOU CAN EXPECT TODAY  Some feelings of bloating in the abdomen.   Passage of more gas than usual.   Spotting of blood in your stool or on the toilet paper.  FINDING OUT THE RESULTS OF YOUR TEST Not all test results are available during your visit. If your test results are not back during the visit, make an appointment with your caregiver to find out the results. Do not assume everything is normal if  you have not heard from your caregiver or the medical facility. It is important for you to follow up on all of your test results.  SEEK IMMEDIATE MEDICAL ATTENTION IF:  You have more than a spotting of blood in your stool.   Your belly is swollen (abdominal distention).   You are nauseated or vomiting.   You have a temperature over 101.   You have abdominal pain or discomfort that is severe or gets worse throughout the day.    Colon polyp and diverticulosis information provided  Further recommendations to follow pending review of pathology report   Colon Polyps Polyps are tissue growths inside the body. Polyps can grow in many places, including the large intestine (colon). A polyp may be a round bump or a mushroom-shaped growth. You could have one polyp or several. Most colon polyps are noncancerous (benign). However, some colon polyps can become cancerous over time. What are the causes? The exact cause of colon polyps is not known. What increases the risk? This condition is more likely to develop in people who:  Have a family history of colon cancer or colon polyps.  Are older than 53 or older than 45 if they are African American.  Have inflammatory bowel disease, such as ulcerative colitis or Crohn disease.  Are overweight.  Smoke cigarettes.  Do not get enough exercise.  Drink too much alcohol.  Eat a diet that is: ? High in fat and red meat. ? Low in  fiber.  Had childhood cancer that was treated with abdominal radiation.  What are the signs or symptoms? Most polyps do not cause symptoms. If you have symptoms, they may include:  Blood coming from your rectum when having a bowel movement.  Blood in your stool.The stool may look dark red or black.  A change in bowel habits, such as constipation or diarrhea.  How is this diagnosed? This condition is diagnosed with a colonoscopy. This is a procedure that uses a lighted, flexible scope to look at the inside of  your colon. How is this treated? Treatment for this condition involves removing any polyps that are found. Those polyps will then be tested for cancer. If cancer is found, your health care provider will talk to you about options for colon cancer treatment. Follow these instructions at home: Diet  Eat plenty of fiber, such as fruits, vegetables, and whole grains.  Eat foods that are high in calcium and vitamin D, such as milk, cheese, yogurt, eggs, liver, fish, and broccoli.  Limit foods high in fat, red meats, and processed meats, such as hot dogs, sausage, bacon, and lunch meats.  Maintain a healthy weight, or lose weight if recommended by your health care provider. General instructions  Do not smoke cigarettes.  Do not drink alcohol excessively.  Keep all follow-up visits as told by your health care provider. This is important. This includes keeping regularly scheduled colonoscopies. Talk to your health care provider about when you need a colonoscopy.  Exercise every day or as told by your health care provider. Contact a health care provider if:  You have new or worsening bleeding during a bowel movement.  You have new or increased blood in your stool.  You have a change in bowel habits.  You unexpectedly lose weight. This information is not intended to replace advice given to you by your health care provider. Make sure you discuss any questions you have with your health care provider. Document Released: 06/14/2004 Document Revised: 02/24/2016 Document Reviewed: 08/09/2015 Elsevier Interactive Patient Education  [employer].    Diverticulosis Diverticulosis is a condition that develops when small pouches (diverticula) form in the wall of the large intestine (colon). The colon is where water is absorbed and stool is formed. The pouches form when the inside layer of the colon pushes through weak spots in the outer layers of the colon. You may have a few pouches or many of  them. What are the causes? The cause of this condition is not known. What increases the risk? The following factors may make you more likely to develop this condition:  Being older than age 76. Your risk for this condition increases with age. Diverticulosis is rare among people younger than age 87. By age 51, many people have it.  Eating a low-fiber diet.  Having frequent constipation.  Being overweight.  Not getting enough exercise.  Smoking.  Taking over-the-counter pain medicines, like aspirin and ibuprofen.  Having a family history of diverticulosis.  What are the signs or symptoms? In most people, there are no symptoms of this condition. If you do have symptoms, they may include:  Bloating.  Cramps in the abdomen.  Constipation or diarrhea.  Pain in the lower left side of the abdomen.  How is this diagnosed? This condition is most often diagnosed during an exam for other colon problems. Because diverticulosis usually has no symptoms, it often cannot be diagnosed independently. This condition may be diagnosed by:  Using a flexible  scope to examine the colon (colonoscopy).  Taking an X-ray of the colon after dye has been put into the colon (barium enema).  Doing a CT scan.  How is this treated? You may not need treatment for this condition if you have never developed an infection related to diverticulosis. If you have had an infection before, treatment may include:  Eating a high-fiber diet. This may include eating more fruits, vegetables, and grains.  Taking a fiber supplement.  Taking a live bacteria supplement (probiotic).  Taking medicine to relax your colon.  Taking antibiotic medicines.  Follow these instructions at home:  Drink 6-8 glasses of water or more each day to prevent constipation.  Try not to strain when you have a bowel movement.  If you have had an infection before: ? Eat more fiber as directed by your health care provider or your  diet and nutrition specialist (dietitian). ? Take a fiber supplement or probiotic, if your health care provider approves.  Take over-the-counter and prescription medicines only as told by your health care provider.  If you were prescribed an antibiotic, take it as told by your health care provider. Do not stop taking the antibiotic even if you start to feel better.  Keep all follow-up visits as told by your health care provider. This is important. Contact a health care provider if:  You have pain in your abdomen.  You have bloating.  You have cramps.  You have not had a bowel movement in 3 days. Get help right away if:  Your pain gets worse.  Your bloating becomes very bad.  You have a fever or chills, and your symptoms suddenly get worse.  You vomit.  You have bowel movements that are bloody or black.  You have bleeding from your rectum. Summary  Diverticulosis is a condition that develops when small pouches (diverticula) form in the wall of the large intestine (colon).  You may have a few pouches or many of them.  This condition is most often diagnosed during an exam for other colon problems.  If you have had an infection related to diverticulosis, treatment may include increasing the fiber in your diet, taking supplements, or taking medicines. This information is not intended to replace advice given to you by your health care provider. Make sure you discuss any questions you have with your health care provider. Document Released: 06/15/2004 Document Revised: 08/07/2016 Document Reviewed: 08/07/2016 Elsevier Interactive Patient Education  2017 [employer].

## 2018-03-27 ENCOUNTER — Encounter (HOSPITAL_COMMUNITY): Payer: Self-pay | Admitting: Internal Medicine

## 2018-03-27 ENCOUNTER — Encounter: Payer: Self-pay | Admitting: Internal Medicine

## 2018-04-23 DIAGNOSIS — Z79899 Other long term (current) drug therapy: Secondary | ICD-10-CM | POA: Diagnosis not present

## 2018-04-23 DIAGNOSIS — M1990 Unspecified osteoarthritis, unspecified site: Secondary | ICD-10-CM | POA: Diagnosis not present

## 2018-04-23 DIAGNOSIS — G894 Chronic pain syndrome: Secondary | ICD-10-CM | POA: Diagnosis not present

## 2018-05-17 DIAGNOSIS — G894 Chronic pain syndrome: Secondary | ICD-10-CM | POA: Diagnosis not present

## 2018-05-17 DIAGNOSIS — M1990 Unspecified osteoarthritis, unspecified site: Secondary | ICD-10-CM | POA: Diagnosis not present

## 2018-05-17 DIAGNOSIS — Z79899 Other long term (current) drug therapy: Secondary | ICD-10-CM | POA: Diagnosis not present

## 2018-06-07 DIAGNOSIS — J441 Chronic obstructive pulmonary disease with (acute) exacerbation: Secondary | ICD-10-CM | POA: Diagnosis not present

## 2018-06-07 DIAGNOSIS — M79604 Pain in right leg: Secondary | ICD-10-CM | POA: Diagnosis not present

## 2018-06-07 DIAGNOSIS — M545 Low back pain: Secondary | ICD-10-CM | POA: Diagnosis not present

## 2018-06-07 DIAGNOSIS — F419 Anxiety disorder, unspecified: Secondary | ICD-10-CM | POA: Diagnosis not present

## 2018-06-07 DIAGNOSIS — Z681 Body mass index (BMI) 19 or less, adult: Secondary | ICD-10-CM | POA: Diagnosis not present

## 2018-06-07 DIAGNOSIS — E559 Vitamin D deficiency, unspecified: Secondary | ICD-10-CM | POA: Diagnosis not present

## 2018-06-07 DIAGNOSIS — F17210 Nicotine dependence, cigarettes, uncomplicated: Secondary | ICD-10-CM | POA: Diagnosis not present

## 2018-07-10 ENCOUNTER — Ambulatory Visit: Payer: Self-pay | Admitting: Orthopedic Surgery

## 2018-07-22 ENCOUNTER — Ambulatory Visit (INDEPENDENT_AMBULATORY_CARE_PROVIDER_SITE_OTHER): Payer: Medicare Other

## 2018-07-22 ENCOUNTER — Ambulatory Visit (INDEPENDENT_AMBULATORY_CARE_PROVIDER_SITE_OTHER): Payer: Medicare Other | Admitting: Orthopedic Surgery

## 2018-07-22 ENCOUNTER — Encounter: Payer: Self-pay | Admitting: Orthopedic Surgery

## 2018-07-22 VITALS — BP 126/65 | HR 71 | Ht 65.0 in | Wt 110.0 lb

## 2018-07-22 DIAGNOSIS — Z9889 Other specified postprocedural states: Secondary | ICD-10-CM

## 2018-07-22 DIAGNOSIS — Z8781 Personal history of (healed) traumatic fracture: Secondary | ICD-10-CM
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
## 2018-07-29 DIAGNOSIS — Z23 Encounter for immunization: Secondary | ICD-10-CM | POA: Diagnosis not present

## 2018-08-05 DIAGNOSIS — G894 Chronic pain syndrome: Secondary | ICD-10-CM | POA: Diagnosis not present

## 2018-08-05 DIAGNOSIS — M1990 Unspecified osteoarthritis, unspecified site: Secondary | ICD-10-CM | POA: Diagnosis not present

## 2018-08-05 DIAGNOSIS — Z79899 Other long term (current) drug therapy: Secondary | ICD-10-CM | POA: Diagnosis not present

## 2018-08-10 DIAGNOSIS — J060 Acute laryngopharyngitis: Secondary | ICD-10-CM | POA: Diagnosis not present

## 2018-08-10 DIAGNOSIS — J441 Chronic obstructive pulmonary disease with (acute) exacerbation: Secondary | ICD-10-CM | POA: Diagnosis not present

## 2018-10-08 DIAGNOSIS — Z79899 Other long term (current) drug therapy: Secondary | ICD-10-CM | POA: Diagnosis not present

## 2018-10-08 DIAGNOSIS — M1990 Unspecified osteoarthritis, unspecified site: Secondary | ICD-10-CM | POA: Diagnosis not present

## 2018-10-10 DIAGNOSIS — E871 Hypo-osmolality and hyponatremia: Secondary | ICD-10-CM | POA: Diagnosis not present

## 2018-10-10 DIAGNOSIS — F419 Anxiety disorder, unspecified: Secondary | ICD-10-CM | POA: Diagnosis not present

## 2018-10-10 DIAGNOSIS — E559 Vitamin D deficiency, unspecified: Secondary | ICD-10-CM | POA: Diagnosis not present

## 2018-10-10 DIAGNOSIS — J449 Chronic obstructive pulmonary disease, unspecified: Secondary | ICD-10-CM | POA: Diagnosis not present

## 2018-10-10 DIAGNOSIS — Z681 Body mass index (BMI) 19 or less, adult: Secondary | ICD-10-CM | POA: Diagnosis not present

## 2018-10-10 DIAGNOSIS — F5101 Primary insomnia: Secondary | ICD-10-CM | POA: Diagnosis not present

## 2018-10-10 DIAGNOSIS — R7301 Impaired fasting glucose: Secondary | ICD-10-CM | POA: Diagnosis not present

## 2018-10-10 DIAGNOSIS — H6120 Impacted cerumen, unspecified ear: Secondary | ICD-10-CM | POA: Diagnosis not present

## 2018-10-10 DIAGNOSIS — Z79899 Other long term (current) drug therapy: Secondary | ICD-10-CM | POA: Diagnosis not present

## 2018-10-10 DIAGNOSIS — F411 Generalized anxiety disorder: Secondary | ICD-10-CM | POA: Diagnosis not present

## 2018-10-10 DIAGNOSIS — F17210 Nicotine dependence, cigarettes, uncomplicated: Secondary | ICD-10-CM | POA: Diagnosis not present

## 2018-10-10 DIAGNOSIS — S72002D Fracture of unspecified part of neck of left femur, subsequent encounter for closed fracture with routine healing: Secondary | ICD-10-CM | POA: Diagnosis not present

## 2018-10-11 DIAGNOSIS — E559 Vitamin D deficiency, unspecified: Secondary | ICD-10-CM | POA: Diagnosis not present

## 2018-12-04 ENCOUNTER — Telehealth: Payer: Self-pay | Admitting: Orthopedic Surgery

## 2018-12-04 DIAGNOSIS — M21371 Foot drop, right foot: Secondary | ICD-10-CM

## 2018-12-04 NOTE — Telephone Encounter (Signed)
Patient called and asked for the phone # for the place on [address] to get a new brace. Stated that Medicare is paying for a new brace and that is where she had went before.  I googled [facility] in [place] and gave her the number. She stated she didn't know if she would need a new prescription for this brace or not.  Please advise

## 2018-12-04 NOTE — Telephone Encounter (Signed)
I have the order. She will need one. I have placed it at front desk for pick up.

## 2018-12-10 DIAGNOSIS — I10 Essential (primary) hypertension: Secondary | ICD-10-CM | POA: Diagnosis not present

## 2018-12-10 DIAGNOSIS — Z0001 Encounter for general adult medical examination with abnormal findings: Secondary | ICD-10-CM | POA: Diagnosis not present

## 2018-12-10 DIAGNOSIS — Z1389 Encounter for screening for other disorder: Secondary | ICD-10-CM | POA: Diagnosis not present

## 2018-12-10 DIAGNOSIS — G14 Postpolio syndrome: Secondary | ICD-10-CM | POA: Diagnosis not present

## 2018-12-10 DIAGNOSIS — M1991 Primary osteoarthritis, unspecified site: Secondary | ICD-10-CM | POA: Diagnosis not present

## 2018-12-10 DIAGNOSIS — G4709 Other insomnia: Secondary | ICD-10-CM | POA: Diagnosis not present

## 2018-12-10 DIAGNOSIS — G894 Chronic pain syndrome: Secondary | ICD-10-CM | POA: Diagnosis not present

## 2018-12-10 DIAGNOSIS — J449 Chronic obstructive pulmonary disease, unspecified: Secondary | ICD-10-CM | POA: Diagnosis not present

## 2018-12-10 DIAGNOSIS — F320 Major depressive disorder, single episode, mild: Secondary | ICD-10-CM | POA: Diagnosis not present

## 2018-12-10 DIAGNOSIS — Z681 Body mass index (BMI) 19 or less, adult: Secondary | ICD-10-CM | POA: Diagnosis not present

## 2018-12-16 ENCOUNTER — Ambulatory Visit (INDEPENDENT_AMBULATORY_CARE_PROVIDER_SITE_OTHER): Payer: Medicare Other | Admitting: Otolaryngology

## 2018-12-16 DIAGNOSIS — R490 Dysphonia: Secondary | ICD-10-CM

## 2018-12-16 DIAGNOSIS — J381 Polyp of vocal cord and larynx: Secondary | ICD-10-CM | POA: Diagnosis not present

## 2018-12-17 ENCOUNTER — Other Ambulatory Visit: Payer: Self-pay

## 2018-12-17 ENCOUNTER — Inpatient Hospital Stay (HOSPITAL_COMMUNITY)
Admission: EM | Admit: 2018-12-17 | Discharge: 2018-12-19 | DRG: 190 | Disposition: A | Discharge status: Home / Self Care | Payer: Medicare Other | Attending: Internal Medicine | Admitting: Internal Medicine

## 2018-12-17 ENCOUNTER — Encounter (HOSPITAL_COMMUNITY): Payer: Self-pay | Admitting: Emergency Medicine

## 2018-12-17 DIAGNOSIS — J449 Chronic obstructive pulmonary disease, unspecified: Secondary | ICD-10-CM | POA: Diagnosis not present

## 2018-12-17 DIAGNOSIS — T59811A Toxic effect of smoke, accidental (unintentional), initial encounter: Secondary | ICD-10-CM | POA: Diagnosis present

## 2018-12-17 DIAGNOSIS — I119 Hypertensive heart disease without heart failure: Secondary | ICD-10-CM | POA: Diagnosis present

## 2018-12-17 DIAGNOSIS — E785 Hyperlipidemia, unspecified: Secondary | ICD-10-CM | POA: Diagnosis present

## 2018-12-17 DIAGNOSIS — F17210 Nicotine dependence, cigarettes, uncomplicated: Secondary | ICD-10-CM | POA: Diagnosis present

## 2018-12-17 DIAGNOSIS — M810 Age-related osteoporosis without current pathological fracture: Secondary | ICD-10-CM | POA: Diagnosis present

## 2018-12-17 DIAGNOSIS — J441 Chronic obstructive pulmonary disease with (acute) exacerbation: Secondary | ICD-10-CM | POA: Diagnosis not present

## 2018-12-17 DIAGNOSIS — G14 Postpolio syndrome: Secondary | ICD-10-CM | POA: Diagnosis present

## 2018-12-17 DIAGNOSIS — F419 Anxiety disorder, unspecified: Secondary | ICD-10-CM | POA: Diagnosis present

## 2018-12-17 DIAGNOSIS — T23062A Burn of unspecified degree of back of left hand, initial encounter: Secondary | ICD-10-CM | POA: Diagnosis not present

## 2018-12-17 DIAGNOSIS — J9601 Acute respiratory failure with hypoxia: Secondary | ICD-10-CM | POA: Diagnosis not present

## 2018-12-17 DIAGNOSIS — R0902 Hypoxemia: Secondary | ICD-10-CM | POA: Diagnosis not present

## 2018-12-17 DIAGNOSIS — Z79899 Other long term (current) drug therapy: Secondary | ICD-10-CM

## 2018-12-17 DIAGNOSIS — J705 Respiratory conditions due to smoke inhalation: Secondary | ICD-10-CM

## 2018-12-17 DIAGNOSIS — Z716 Tobacco abuse counseling: Secondary | ICD-10-CM

## 2018-12-17 DIAGNOSIS — J440 Chronic obstructive pulmonary disease with acute lower respiratory infection: Secondary | ICD-10-CM | POA: Diagnosis present

## 2018-12-17 DIAGNOSIS — F418 Other specified anxiety disorders: Secondary | ICD-10-CM | POA: Diagnosis present

## 2018-12-17 DIAGNOSIS — K219 Gastro-esophageal reflux disease without esophagitis: Secondary | ICD-10-CM | POA: Diagnosis present

## 2018-12-17 DIAGNOSIS — Z800 Family history of malignant neoplasm of digestive organs: Secondary | ICD-10-CM

## 2018-12-17 DIAGNOSIS — G894 Chronic pain syndrome: Secondary | ICD-10-CM | POA: Diagnosis present

## 2018-12-17 DIAGNOSIS — T300 Burn of unspecified body region, unspecified degree: Secondary | ICD-10-CM | POA: Diagnosis not present

## 2018-12-17 DIAGNOSIS — M797 Fibromyalgia: Secondary | ICD-10-CM | POA: Diagnosis present

## 2018-12-17 DIAGNOSIS — X102XXA Contact with fats and cooking oils, initial encounter: Secondary | ICD-10-CM | POA: Diagnosis present

## 2018-12-17 DIAGNOSIS — Z90710 Acquired absence of both cervix and uterus: Secondary | ICD-10-CM

## 2018-12-17 DIAGNOSIS — J189 Pneumonia, unspecified organism: Secondary | ICD-10-CM | POA: Diagnosis not present

## 2018-12-17 DIAGNOSIS — J9621 Acute and chronic respiratory failure with hypoxia: Secondary | ICD-10-CM | POA: Diagnosis not present

## 2018-12-17 DIAGNOSIS — R0602 Shortness of breath: Secondary | ICD-10-CM | POA: Diagnosis not present

## 2018-12-17 DIAGNOSIS — D509 Iron deficiency anemia, unspecified: Secondary | ICD-10-CM | POA: Diagnosis present

## 2018-12-17 DIAGNOSIS — T59814A Toxic effect of smoke, undetermined, initial encounter: Secondary | ICD-10-CM | POA: Diagnosis not present

## 2018-12-17 MED ORDER — IPRATROPIUM-ALBUTEROL 0.5-2.5 (3) MG/3ML IN SOLN
3.0000 mL | Freq: Once | RESPIRATORY_TRACT | Status: AC
  Administered 2018-12-18: 3 mL via RESPIRATORY_TRACT
  Filled 2018-12-17: qty 3

## 2018-12-17 MED ORDER — METHYLPREDNISOLONE SODIUM SUCC 125 MG IJ SOLR
60.0000 mg | Freq: Once | INTRAMUSCULAR | Status: AC
  Administered 2018-12-18: 60 mg via INTRAVENOUS
  Filled 2018-12-17: qty 2
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
## 2018-12-18 ENCOUNTER — Emergency Department (HOSPITAL_COMMUNITY): Payer: Medicare Other

## 2018-12-18 ENCOUNTER — Encounter (HOSPITAL_COMMUNITY): Payer: Self-pay | Admitting: Family Medicine

## 2018-12-18 DIAGNOSIS — F419 Anxiety disorder, unspecified: Secondary | ICD-10-CM

## 2018-12-18 DIAGNOSIS — J181 Lobar pneumonia, unspecified organism: Secondary | ICD-10-CM

## 2018-12-18 DIAGNOSIS — J441 Chronic obstructive pulmonary disease with (acute) exacerbation: Secondary | ICD-10-CM | POA: Diagnosis not present

## 2018-12-18 DIAGNOSIS — R0602 Shortness of breath: Secondary | ICD-10-CM | POA: Diagnosis not present

## 2018-12-18 DIAGNOSIS — J189 Pneumonia, unspecified organism: Secondary | ICD-10-CM | POA: Diagnosis present

## 2018-12-18 DIAGNOSIS — J9601 Acute respiratory failure with hypoxia: Secondary | ICD-10-CM | POA: Diagnosis present

## 2018-12-18 LAB — CBC WITH DIFFERENTIAL/PLATELET
ABS IMMATURE GRANULOCYTES: 0.03 10*3/uL (ref 0.00–0.07)
Basophils Absolute: 0.1 10*3/uL (ref 0.0–0.1)
Basophils Relative: 1 %
EOS ABS: 0.3 10*3/uL (ref 0.0–0.5)
Eosinophils Relative: 3 %
HCT: 36.7 % (ref 36.0–46.0)
Hemoglobin: 11.5 g/dL — ABNORMAL LOW (ref 12.0–15.0)
IMMATURE GRANULOCYTES: 0 %
LYMPHS ABS: 1 10*3/uL (ref 0.7–4.0)
Lymphocytes Relative: 10 %
MCH: 31.8 pg (ref 26.0–34.0)
MCHC: 31.3 g/dL (ref 30.0–36.0)
MCV: 101.4 fL — ABNORMAL HIGH (ref 80.0–100.0)
MONOS PCT: 5 %
Monocytes Absolute: 0.5 10*3/uL (ref 0.1–1.0)
NEUTROS ABS: 7.8 10*3/uL — AB (ref 1.7–7.7)
NEUTROS PCT: 81 %
NRBC: 0 % (ref 0.0–0.2)
PLATELETS: 265 10*3/uL (ref 150–400)
RBC: 3.62 MIL/uL — AB (ref 3.87–5.11)
RDW: 15.4 % (ref 11.5–15.5)
WBC: 9.7 10*3/uL (ref 4.0–10.5)

## 2018-12-18 LAB — GLUCOSE, CAPILLARY: Glucose-Capillary: 181 mg/dL — ABNORMAL HIGH (ref 70–99)

## 2018-12-18 LAB — LACTIC ACID, PLASMA
Lactic Acid, Venous: 0.7 mmol/L (ref 0.5–1.9)
Lactic Acid, Venous: 1.2 mmol/L (ref 0.5–1.9)

## 2018-12-18 LAB — HEPATIC FUNCTION PANEL
ALT: 25 U/L (ref 0–44)
AST: 32 U/L (ref 15–41)
Albumin: 3.8 g/dL (ref 3.5–5.0)
Alkaline Phosphatase: 59 U/L (ref 38–126)
BILIRUBIN TOTAL: 0.2 mg/dL — AB (ref 0.3–1.2)
Bilirubin, Direct: 0.1 mg/dL (ref 0.0–0.2)
Indirect Bilirubin: 0.1 mg/dL — ABNORMAL LOW (ref 0.3–0.9)
Total Protein: 6.5 g/dL (ref 6.5–8.1)

## 2018-12-18 LAB — BASIC METABOLIC PANEL
Anion gap: 11 (ref 5–15)
BUN: 9 mg/dL (ref 8–23)
CALCIUM: 8.5 mg/dL — AB (ref 8.9–10.3)
CO2: 29 mmol/L (ref 22–32)
Chloride: 92 mmol/L — ABNORMAL LOW (ref 98–111)
Creatinine, Ser: 0.61 mg/dL (ref 0.44–1.00)
GFR calc Af Amer: >60 mL/min (ref >60)
GLUCOSE: 103 mg/dL — AB (ref 70–99)
POTASSIUM: 3.4 mmol/L — AB (ref 3.5–5.1)
SODIUM: 132 mmol/L — AB (ref 135–145)

## 2018-12-18 LAB — PROCALCITONIN: Procalcitonin: <0.1 ng/mL

## 2018-12-18 LAB — D-DIMER, QUANTITATIVE: D-Dimer, Quant: 0.64 ug/mL-FEU — ABNORMAL HIGH (ref 0.00–0.50)

## 2018-12-18 LAB — BRAIN NATRIURETIC PEPTIDE: B NATRIURETIC PEPTIDE 5: 61 pg/mL (ref 0.0–100.0)

## 2018-12-18 MED ORDER — ALBUTEROL SULFATE (2.5 MG/3ML) 0.083% IN NEBU: 2.5000 mg | INHALATION_SOLUTION | RESPIRATORY_TRACT | Status: DC | PRN

## 2018-12-18 MED ORDER — IPRATROPIUM-ALBUTEROL 0.5-2.5 (3) MG/3ML IN SOLN
3.0000 mL | Freq: Four times a day (QID) | RESPIRATORY_TRACT | Status: DC
  Administered 2018-12-18: 3 mL via RESPIRATORY_TRACT
  Filled 2018-12-18: qty 3

## 2018-12-18 MED ORDER — HYDROCODONE-ACETAMINOPHEN 5-325 MG PO TABS
1.0000 | ORAL_TABLET | ORAL | Status: DC | PRN
  Administered 2018-12-18 – 2018-12-19 (×5): 2 via ORAL
  Filled 2018-12-18 (×5): qty 2

## 2018-12-18 MED ORDER — SODIUM CHLORIDE 0.9 % IV SOLN
500.0000 mg | Freq: Once | INTRAVENOUS | Status: AC
  Administered 2018-12-18: 500 mg via INTRAVENOUS
  Filled 2018-12-18: qty 500

## 2018-12-18 MED ORDER — SODIUM CHLORIDE 0.9 % IV SOLN
1.0000 g | INTRAVENOUS | Status: DC
  Administered 2018-12-19: 1 g via INTRAVENOUS
  Filled 2018-12-18: qty 10

## 2018-12-18 MED ORDER — FLUTICASONE-UMECLIDIN-VILANT 100-62.5-25 MCG/INH IN AEPB: 1.0000 | INHALATION_SPRAY | Freq: Every day | RESPIRATORY_TRACT | Status: DC

## 2018-12-18 MED ORDER — POTASSIUM CHLORIDE CRYS ER 20 MEQ PO TBCR
20.0000 meq | EXTENDED_RELEASE_TABLET | Freq: Once | ORAL | Status: AC
  Administered 2018-12-18: 20 meq via ORAL
  Filled 2018-12-18: qty 1

## 2018-12-18 MED ORDER — FLUTICASONE FUROATE-VILANTEROL 100-25 MCG/INH IN AEPB
1.0000 | INHALATION_SPRAY | Freq: Every day | RESPIRATORY_TRACT | Status: DC
  Administered 2018-12-18 – 2018-12-19 (×2): 1 via RESPIRATORY_TRACT
  Filled 2018-12-18: qty 28

## 2018-12-18 MED ORDER — ACETAMINOPHEN 325 MG PO TABS: 650.0000 mg | ORAL_TABLET | Freq: Four times a day (QID) | ORAL | Status: DC | PRN

## 2018-12-18 MED ORDER — METHYLPREDNISOLONE SODIUM SUCC 125 MG IJ SOLR
60.0000 mg | Freq: Two times a day (BID) | INTRAMUSCULAR | Status: DC
  Administered 2018-12-18 – 2018-12-19 (×2): 60 mg via INTRAVENOUS
  Filled 2018-12-18 (×3): qty 2

## 2018-12-18 MED ORDER — ACETAMINOPHEN 650 MG RE SUPP: 650.0000 mg | Freq: Four times a day (QID) | RECTAL | Status: DC | PRN

## 2018-12-18 MED ORDER — ORAL CARE MOUTH RINSE
15.0000 mL | Freq: Two times a day (BID) | OROMUCOSAL | Status: DC
  Administered 2018-12-18 – 2018-12-19 (×3): 15 mL via OROMUCOSAL

## 2018-12-18 MED ORDER — SODIUM CHLORIDE 0.9 % IV SOLN
1.0000 g | Freq: Once | INTRAVENOUS | Status: AC
  Administered 2018-12-18: 1 g via INTRAVENOUS
  Filled 2018-12-18: qty 10

## 2018-12-18 MED ORDER — PREDNISONE 20 MG PO TABS
40.0000 mg | ORAL_TABLET | Freq: Every day | ORAL | Status: DC
  Administered 2018-12-18: 40 mg via ORAL
  Filled 2018-12-18: qty 2

## 2018-12-18 MED ORDER — POTASSIUM CHLORIDE IN NACL 20-0.9 MEQ/L-% IV SOLN
INTRAVENOUS | Status: AC
  Administered 2018-12-18: 05:00:00 via INTRAVENOUS

## 2018-12-18 MED ORDER — GUAIFENESIN ER 600 MG PO TB12
1200.0000 mg | ORAL_TABLET | Freq: Two times a day (BID) | ORAL | Status: DC
  Administered 2018-12-18 – 2018-12-19 (×2): 1200 mg via ORAL
  Filled 2018-12-18 (×2): qty 2

## 2018-12-18 MED ORDER — IPRATROPIUM-ALBUTEROL 0.5-2.5 (3) MG/3ML IN SOLN
3.0000 mL | Freq: Three times a day (TID) | RESPIRATORY_TRACT | Status: DC
  Administered 2018-12-19 (×2): 3 mL via RESPIRATORY_TRACT
  Filled 2018-12-18 (×2): qty 3

## 2018-12-18 MED ORDER — CHLORHEXIDINE GLUCONATE 0.12 % MT SOLN
15.0000 mL | Freq: Two times a day (BID) | OROMUCOSAL | Status: DC
  Administered 2018-12-18 – 2018-12-19 (×3): 15 mL via OROMUCOSAL
  Filled 2018-12-18 (×3): qty 15

## 2018-12-18 MED ORDER — POLYETHYLENE GLYCOL 3350 17 G PO PACK: 17.0000 g | PACK | Freq: Every day | ORAL | Status: DC | PRN

## 2018-12-18 MED ORDER — BISACODYL 5 MG PO TBEC: 5.0000 mg | DELAYED_RELEASE_TABLET | Freq: Every day | ORAL | Status: DC | PRN

## 2018-12-18 MED ORDER — UMECLIDINIUM BROMIDE 62.5 MCG/INH IN AEPB
1.0000 | INHALATION_SPRAY | Freq: Every day | RESPIRATORY_TRACT | Status: DC
  Administered 2018-12-18 – 2018-12-19 (×2): 1 via RESPIRATORY_TRACT
  Filled 2018-12-18: qty 7

## 2018-12-18 MED ORDER — ENOXAPARIN SODIUM 40 MG/0.4ML ~~LOC~~ SOLN
40.0000 mg | SUBCUTANEOUS | Status: DC
  Administered 2018-12-18 – 2018-12-19 (×2): 40 mg via SUBCUTANEOUS
  Filled 2018-12-18 (×2): qty 0.4

## 2018-12-18 MED ORDER — SODIUM CHLORIDE 0.9 % IV SOLN
500.0000 mg | INTRAVENOUS | Status: DC
  Administered 2018-12-19: 500 mg via INTRAVENOUS
  Filled 2018-12-18: qty 500

## 2018-12-18 MED ORDER — IPRATROPIUM-ALBUTEROL 0.5-2.5 (3) MG/3ML IN SOLN: 3.0000 mL | Freq: Once | RESPIRATORY_TRACT | Status: DC

## 2018-12-18 NOTE — Progress Notes (Signed)
SATURATION QUALIFICATIONS: (This note is used to comply with regulatory documentation for home oxygen)  Patient Saturations on Room Air at Rest = 94 %  Patient Saturations on Room Air while Ambulating = 85 %  Patient Saturations on 2 Liters of oxygen while Ambulating = 96 %  Please briefly explain why patient needs home oxygen:

## 2018-12-18 NOTE — ED Notes (Signed)
Pt. Was slightly unsteady while ambulating with staff. Pt's O2 saturation went down to 80% after ambulating. Pt. Did not state she had any trouble with her breathing and felt like she was at her normal.
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
## 2018-12-18 NOTE — Progress Notes (Signed)
Patient admitted to the hospital earlier this morning by Dr. Myna Hidalgo  Patient seen and examined.  Overall she feels better, but is still wheezing.  She becomes hypoxic on room air while ambulating.  She does not have any nebulizer at home and does not use oxygen at home.  She continues to smoke.  She has been admitted to the hospital with COPD exacerbation.  She is currently on bronchodilators, steroids and antibiotics.  Since she is still hypoxic on room air, she may need to discharge home on oxygen.  We will continue current treatments for another 24 hours.  Amy Rubio
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
## 2018-12-18 NOTE — Care Management Obs Status (Signed)
[facility] NOTIFICATION   Patient Details  Name: Amy Rubio MRN: [id number] Date of Birth: 1951/03/03   Medicare Observation Status Notification Given:  Yes    [name] 12/18/2018, 2:28 PM

## 2018-12-19 DIAGNOSIS — Z716 Tobacco abuse counseling: Secondary | ICD-10-CM | POA: Diagnosis not present

## 2018-12-19 DIAGNOSIS — F418 Other specified anxiety disorders: Secondary | ICD-10-CM | POA: Diagnosis present

## 2018-12-19 DIAGNOSIS — T59811A Toxic effect of smoke, accidental (unintentional), initial encounter: Secondary | ICD-10-CM | POA: Diagnosis present

## 2018-12-19 DIAGNOSIS — Z79899 Other long term (current) drug therapy: Secondary | ICD-10-CM | POA: Diagnosis not present

## 2018-12-19 DIAGNOSIS — Z90710 Acquired absence of both cervix and uterus: Secondary | ICD-10-CM | POA: Diagnosis not present

## 2018-12-19 DIAGNOSIS — G14 Postpolio syndrome: Secondary | ICD-10-CM | POA: Diagnosis present

## 2018-12-19 DIAGNOSIS — J449 Chronic obstructive pulmonary disease, unspecified: Secondary | ICD-10-CM | POA: Diagnosis present

## 2018-12-19 DIAGNOSIS — F419 Anxiety disorder, unspecified: Secondary | ICD-10-CM | POA: Diagnosis present

## 2018-12-19 DIAGNOSIS — J441 Chronic obstructive pulmonary disease with (acute) exacerbation: Secondary | ICD-10-CM | POA: Diagnosis present

## 2018-12-19 DIAGNOSIS — X102XXA Contact with fats and cooking oils, initial encounter: Secondary | ICD-10-CM | POA: Diagnosis present

## 2018-12-19 DIAGNOSIS — I119 Hypertensive heart disease without heart failure: Secondary | ICD-10-CM | POA: Diagnosis present

## 2018-12-19 DIAGNOSIS — T23062A Burn of unspecified degree of back of left hand, initial encounter: Secondary | ICD-10-CM | POA: Diagnosis present

## 2018-12-19 DIAGNOSIS — G894 Chronic pain syndrome: Secondary | ICD-10-CM | POA: Diagnosis present

## 2018-12-19 DIAGNOSIS — E785 Hyperlipidemia, unspecified: Secondary | ICD-10-CM | POA: Diagnosis present

## 2018-12-19 DIAGNOSIS — M810 Age-related osteoporosis without current pathological fracture: Secondary | ICD-10-CM | POA: Diagnosis present

## 2018-12-19 DIAGNOSIS — J181 Lobar pneumonia, unspecified organism: Secondary | ICD-10-CM | POA: Diagnosis not present

## 2018-12-19 DIAGNOSIS — D509 Iron deficiency anemia, unspecified: Secondary | ICD-10-CM | POA: Diagnosis present

## 2018-12-19 DIAGNOSIS — J189 Pneumonia, unspecified organism: Secondary | ICD-10-CM | POA: Diagnosis present

## 2018-12-19 DIAGNOSIS — J9601 Acute respiratory failure with hypoxia: Secondary | ICD-10-CM | POA: Diagnosis not present

## 2018-12-19 DIAGNOSIS — M797 Fibromyalgia: Secondary | ICD-10-CM | POA: Diagnosis present

## 2018-12-19 DIAGNOSIS — J440 Chronic obstructive pulmonary disease with acute lower respiratory infection: Secondary | ICD-10-CM | POA: Diagnosis present

## 2018-12-19 DIAGNOSIS — K219 Gastro-esophageal reflux disease without esophagitis: Secondary | ICD-10-CM | POA: Diagnosis present

## 2018-12-19 DIAGNOSIS — F17210 Nicotine dependence, cigarettes, uncomplicated: Secondary | ICD-10-CM | POA: Diagnosis present

## 2018-12-19 DIAGNOSIS — Z800 Family history of malignant neoplasm of digestive organs: Secondary | ICD-10-CM | POA: Diagnosis not present

## 2018-12-19 LAB — CBC
HCT: 37.3 % (ref 36.0–46.0)
Hemoglobin: 11.5 g/dL — ABNORMAL LOW (ref 12.0–15.0)
MCH: 31.3 pg (ref 26.0–34.0)
MCHC: 30.8 g/dL (ref 30.0–36.0)
MCV: 101.6 fL — ABNORMAL HIGH (ref 80.0–100.0)
Platelets: 265 10*3/uL (ref 150–400)
RBC: 3.67 MIL/uL — ABNORMAL LOW (ref 3.87–5.11)
RDW: 15.4 % (ref 11.5–15.5)
WBC: 3.6 10*3/uL — ABNORMAL LOW (ref 4.0–10.5)
nRBC: 0 % (ref 0.0–0.2)

## 2018-12-19 LAB — BASIC METABOLIC PANEL
Anion gap: 8 (ref 5–15)
BUN: 15 mg/dL (ref 8–23)
CO2: 25 mmol/L (ref 22–32)
Calcium: 8.7 mg/dL — ABNORMAL LOW (ref 8.9–10.3)
Chloride: 102 mmol/L (ref 98–111)
Creatinine, Ser: 0.52 mg/dL (ref 0.44–1.00)
GFR calc Af Amer: >60 mL/min (ref >60)
GFR calc non Af Amer: >60 mL/min (ref >60)
Glucose, Bld: 162 mg/dL — ABNORMAL HIGH (ref 70–99)
Potassium: 4.3 mmol/L (ref 3.5–5.1)
Sodium: 135 mmol/L (ref 135–145)

## 2018-12-19 LAB — PROCALCITONIN: Procalcitonin: <0.1 ng/mL

## 2018-12-19 MED ORDER — ALBUTEROL SULFATE (2.5 MG/3ML) 0.083% IN NEBU: 2.5000 mg | INHALATION_SOLUTION | RESPIRATORY_TRACT | 12 refills | Status: AC | PRN

## 2018-12-19 MED ORDER — PREDNISONE 10 MG PO TABS: ORAL_TABLET | ORAL | 0 refills | Status: DC

## 2018-12-19 MED ORDER — GUAIFENESIN ER 600 MG PO TB12: 600.0000 mg | ORAL_TABLET | Freq: Two times a day (BID) | ORAL | 0 refills | Status: AC

## 2018-12-19 MED ORDER — LEVOFLOXACIN 750 MG PO TABS: 750.0000 mg | ORAL_TABLET | Freq: Every day | ORAL | 0 refills | Status: AC

## 2018-12-19 NOTE — Progress Notes (Signed)
Removed IV-clean, dry, intact. Reviewed d/c paperwork with patient and granddaughter. Reviewed new medications and changes. Answered all questions. Wheeled stable patient and belongings to main entrance where she was picked up by granddaughter to d/c to home

## 2018-12-19 NOTE — Progress Notes (Signed)
SATURATION QUALIFICATIONS: (This note is used to comply with regulatory documentation for home oxygen)  Patient Saturations on Room Air at Rest = 97%  Patient Saturations on Room Air while Ambulating = 93%  Patient Saturations on 0 Liters of oxygen while Ambulating = N/A%  Please briefly explain why patient needs home oxygen:

## 2018-12-20 LAB — HIV ANTIBODY (ROUTINE TESTING W REFLEX): HIV Screen 4th Generation wRfx: NONREACTIVE
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
## 2018-12-23 DIAGNOSIS — J441 Chronic obstructive pulmonary disease with (acute) exacerbation: Secondary | ICD-10-CM | POA: Diagnosis not present

## 2018-12-23 DIAGNOSIS — J060 Acute laryngopharyngitis: Secondary | ICD-10-CM | POA: Diagnosis not present

## 2018-12-23 LAB — CULTURE, BLOOD (ROUTINE X 2)
Culture: NO GROWTH
Culture: NO GROWTH
Special Requests: ADEQUATE
Special Requests: ADEQUATE

## 2018-12-27 ENCOUNTER — Other Ambulatory Visit: Payer: Self-pay | Admitting: Otolaryngology

## 2019-01-03 DIAGNOSIS — Z79899 Other long term (current) drug therapy: Secondary | ICD-10-CM | POA: Diagnosis not present

## 2019-01-28 DIAGNOSIS — M810 Age-related osteoporosis without current pathological fracture: Secondary | ICD-10-CM | POA: Diagnosis not present

## 2019-01-28 DIAGNOSIS — Z681 Body mass index (BMI) 19 or less, adult: Secondary | ICD-10-CM | POA: Diagnosis not present

## 2019-01-28 DIAGNOSIS — J449 Chronic obstructive pulmonary disease, unspecified: Secondary | ICD-10-CM | POA: Diagnosis not present

## 2019-03-06 DIAGNOSIS — Z79899 Other long term (current) drug therapy: Secondary | ICD-10-CM | POA: Diagnosis not present

## 2019-03-06 DIAGNOSIS — G894 Chronic pain syndrome: Secondary | ICD-10-CM | POA: Diagnosis not present

## 2019-03-06 DIAGNOSIS — Z1159 Encounter for screening for other viral diseases: Secondary | ICD-10-CM | POA: Diagnosis not present

## 2019-03-06 DIAGNOSIS — I10 Essential (primary) hypertension: Secondary | ICD-10-CM | POA: Diagnosis not present

## 2019-03-06 DIAGNOSIS — E559 Vitamin D deficiency, unspecified: Secondary | ICD-10-CM | POA: Diagnosis not present

## 2019-03-06 DIAGNOSIS — M1990 Unspecified osteoarthritis, unspecified site: Secondary | ICD-10-CM | POA: Diagnosis not present

## 2019-03-06 DIAGNOSIS — M129 Arthropathy, unspecified: Secondary | ICD-10-CM | POA: Diagnosis not present

## 2019-03-19 ENCOUNTER — Other Ambulatory Visit: Payer: Self-pay

## 2019-03-19 ENCOUNTER — Emergency Department (HOSPITAL_COMMUNITY): Payer: Medicare Other

## 2019-03-19 ENCOUNTER — Encounter (HOSPITAL_COMMUNITY): Payer: Self-pay | Admitting: Emergency Medicine

## 2019-03-19 ENCOUNTER — Inpatient Hospital Stay (HOSPITAL_COMMUNITY)
Admission: EM | Admit: 2019-03-19 | Discharge: 2019-03-23 | DRG: 190 | Disposition: A | Discharge status: Home / Self Care | Payer: Medicare Other | Attending: Internal Medicine | Admitting: Internal Medicine

## 2019-03-19 DIAGNOSIS — M797 Fibromyalgia: Secondary | ICD-10-CM | POA: Diagnosis present

## 2019-03-19 DIAGNOSIS — L89151 Pressure ulcer of sacral region, stage 1: Secondary | ICD-10-CM | POA: Diagnosis present

## 2019-03-19 DIAGNOSIS — G8929 Other chronic pain: Secondary | ICD-10-CM | POA: Diagnosis present

## 2019-03-19 DIAGNOSIS — Z20828 Contact with and (suspected) exposure to other viral communicable diseases: Secondary | ICD-10-CM | POA: Diagnosis not present

## 2019-03-19 DIAGNOSIS — Z825 Family history of asthma and other chronic lower respiratory diseases: Secondary | ICD-10-CM

## 2019-03-19 DIAGNOSIS — J449 Chronic obstructive pulmonary disease, unspecified: Secondary | ICD-10-CM | POA: Diagnosis present

## 2019-03-19 DIAGNOSIS — J80 Acute respiratory distress syndrome: Secondary | ICD-10-CM | POA: Diagnosis not present

## 2019-03-19 DIAGNOSIS — F17210 Nicotine dependence, cigarettes, uncomplicated: Secondary | ICD-10-CM | POA: Diagnosis not present

## 2019-03-19 DIAGNOSIS — Z79899 Other long term (current) drug therapy: Secondary | ICD-10-CM

## 2019-03-19 DIAGNOSIS — R0902 Hypoxemia: Secondary | ICD-10-CM | POA: Diagnosis not present

## 2019-03-19 DIAGNOSIS — F419 Anxiety disorder, unspecified: Secondary | ICD-10-CM | POA: Diagnosis present

## 2019-03-19 DIAGNOSIS — J441 Chronic obstructive pulmonary disease with (acute) exacerbation: Principal | ICD-10-CM | POA: Diagnosis present

## 2019-03-19 DIAGNOSIS — R069 Unspecified abnormalities of breathing: Secondary | ICD-10-CM | POA: Diagnosis not present

## 2019-03-19 DIAGNOSIS — Z7951 Long term (current) use of inhaled steroids: Secondary | ICD-10-CM

## 2019-03-19 DIAGNOSIS — F17200 Nicotine dependence, unspecified, uncomplicated: Secondary | ICD-10-CM | POA: Diagnosis present

## 2019-03-19 DIAGNOSIS — G14 Postpolio syndrome: Secondary | ICD-10-CM | POA: Diagnosis present

## 2019-03-19 DIAGNOSIS — Z8719 Personal history of other diseases of the digestive system: Secondary | ICD-10-CM

## 2019-03-19 DIAGNOSIS — I10 Essential (primary) hypertension: Secondary | ICD-10-CM | POA: Diagnosis not present

## 2019-03-19 DIAGNOSIS — R0602 Shortness of breath: Secondary | ICD-10-CM | POA: Diagnosis not present

## 2019-03-19 DIAGNOSIS — Z800 Family history of malignant neoplasm of digestive organs: Secondary | ICD-10-CM

## 2019-03-19 DIAGNOSIS — K219 Gastro-esophageal reflux disease without esophagitis: Secondary | ICD-10-CM | POA: Diagnosis present

## 2019-03-19 DIAGNOSIS — J9601 Acute respiratory failure with hypoxia: Secondary | ICD-10-CM | POA: Diagnosis present

## 2019-03-19 DIAGNOSIS — Z8249 Family history of ischemic heart disease and other diseases of the circulatory system: Secondary | ICD-10-CM

## 2019-03-19 DIAGNOSIS — Z8371 Family history of colonic polyps: Secondary | ICD-10-CM

## 2019-03-19 DIAGNOSIS — M810 Age-related osteoporosis without current pathological fracture: Secondary | ICD-10-CM | POA: Diagnosis present

## 2019-03-19 DIAGNOSIS — Z801 Family history of malignant neoplasm of trachea, bronchus and lung: Secondary | ICD-10-CM

## 2019-03-19 DIAGNOSIS — Z209 Contact with and (suspected) exposure to unspecified communicable disease: Secondary | ICD-10-CM | POA: Diagnosis not present

## 2019-03-19 DIAGNOSIS — Z86010 Personal history of colonic polyps: Secondary | ICD-10-CM

## 2019-03-19 DIAGNOSIS — F329 Major depressive disorder, single episode, unspecified: Secondary | ICD-10-CM | POA: Diagnosis present

## 2019-03-19 DIAGNOSIS — L8990 Pressure ulcer of unspecified site, unspecified stage: Secondary | ICD-10-CM | POA: Insufficient documentation

## 2019-03-19 LAB — BASIC METABOLIC PANEL
Anion gap: 11 (ref 5–15)
BUN: 9 mg/dL (ref 8–23)
CO2: 27 mmol/L (ref 22–32)
Calcium: 9 mg/dL (ref 8.9–10.3)
Chloride: 99 mmol/L (ref 98–111)
Creatinine, Ser: 0.54 mg/dL (ref 0.44–1.00)
GFR calc Af Amer: >60 mL/min (ref >60)
GFR calc non Af Amer: >60 mL/min (ref >60)
Glucose, Bld: 111 mg/dL — ABNORMAL HIGH (ref 70–99)
Potassium: 3.6 mmol/L (ref 3.5–5.1)
Sodium: 137 mmol/L (ref 135–145)

## 2019-03-19 LAB — CBC
HCT: 41.5 % (ref 36.0–46.0)
Hemoglobin: 13.5 g/dL (ref 12.0–15.0)
MCH: 33.3 pg (ref 26.0–34.0)
MCHC: 32.5 g/dL (ref 30.0–36.0)
MCV: 102.5 fL — ABNORMAL HIGH (ref 80.0–100.0)
Platelets: 223 10*3/uL (ref 150–400)
RBC: 4.05 MIL/uL (ref 3.87–5.11)
RDW: 15.5 % (ref 11.5–15.5)
WBC: 4.4 10*3/uL (ref 4.0–10.5)
nRBC: 0 % (ref 0.0–0.2)

## 2019-03-19 LAB — SARS CORONAVIRUS 2 BY RT PCR (HOSPITAL ORDER, PERFORMED IN ~~LOC~~ HOSPITAL LAB): SARS Coronavirus 2: NEGATIVE

## 2019-03-19 MED ORDER — SODIUM CHLORIDE 0.9 % IV SOLN: 250.0000 mL | INTRAVENOUS | Status: DC | PRN

## 2019-03-19 MED ORDER — SODIUM CHLORIDE 0.9% FLUSH
3.0000 mL | Freq: Two times a day (BID) | INTRAVENOUS | Status: DC
  Administered 2019-03-20 – 2019-03-23 (×5): 3 mL via INTRAVENOUS

## 2019-03-19 MED ORDER — ONDANSETRON HCL 4 MG/2ML IJ SOLN
4.0000 mg | Freq: Once | INTRAMUSCULAR | Status: AC | PRN
  Administered 2019-03-19: 4 mg via INTRAVENOUS
  Filled 2019-03-19: qty 2

## 2019-03-19 MED ORDER — PREGABALIN 75 MG PO CAPS
200.0000 mg | ORAL_CAPSULE | Freq: Two times a day (BID) | ORAL | Status: DC
  Administered 2019-03-20 – 2019-03-23 (×8): 200 mg via ORAL
  Filled 2019-03-19 (×8): qty 1

## 2019-03-19 MED ORDER — METHYLPREDNISOLONE SODIUM SUCC 125 MG IJ SOLR
125.0000 mg | Freq: Once | INTRAMUSCULAR | Status: AC
  Administered 2019-03-19: 21:00:00 125 mg via INTRAVENOUS
  Filled 2019-03-19: qty 2

## 2019-03-19 MED ORDER — FLUTICASONE-UMECLIDIN-VILANT 100-62.5-25 MCG/INH IN AEPB: 1.0000 | INHALATION_SPRAY | Freq: Every day | RESPIRATORY_TRACT | Status: DC

## 2019-03-19 MED ORDER — SODIUM CHLORIDE 0.9% FLUSH: 3.0000 mL | INTRAVENOUS | Status: DC | PRN

## 2019-03-19 MED ORDER — FLUOXETINE HCL 20 MG PO CAPS
40.0000 mg | ORAL_CAPSULE | Freq: Every day | ORAL | Status: DC
  Administered 2019-03-20 – 2019-03-23 (×4): 40 mg via ORAL
  Filled 2019-03-19 (×4): qty 2

## 2019-03-19 MED ORDER — OXYCODONE-ACETAMINOPHEN 5-325 MG PO TABS
1.0000 | ORAL_TABLET | Freq: Two times a day (BID) | ORAL | Status: DC | PRN
  Administered 2019-03-20 (×2): 1 via ORAL
  Filled 2019-03-19 (×2): qty 1

## 2019-03-19 MED ORDER — ALBUTEROL SULFATE (2.5 MG/3ML) 0.083% IN NEBU
2.5000 mg | INHALATION_SOLUTION | Freq: Four times a day (QID) | RESPIRATORY_TRACT | Status: DC
  Administered 2019-03-20 – 2019-03-23 (×10): 2.5 mg via RESPIRATORY_TRACT
  Filled 2019-03-19 (×10): qty 3

## 2019-03-19 MED ORDER — SODIUM CHLORIDE 0.9 % IV SOLN
500.0000 mg | INTRAVENOUS | Status: DC
  Administered 2019-03-20 – 2019-03-21 (×3): 500 mg via INTRAVENOUS
  Filled 2019-03-19 (×3): qty 500

## 2019-03-19 MED ORDER — ENOXAPARIN SODIUM 40 MG/0.4ML ~~LOC~~ SOLN
40.0000 mg | SUBCUTANEOUS | Status: DC
  Filled 2019-03-19 (×2): qty 0.4

## 2019-03-19 MED ORDER — IPRATROPIUM-ALBUTEROL 0.5-2.5 (3) MG/3ML IN SOLN
3.0000 mL | Freq: Once | RESPIRATORY_TRACT | Status: DC
  Filled 2019-03-19: qty 3

## 2019-03-19 MED ORDER — ALBUTEROL SULFATE HFA 108 (90 BASE) MCG/ACT IN AERS
2.0000 | INHALATION_SPRAY | RESPIRATORY_TRACT | Status: DC | PRN
  Administered 2019-03-19: 2 via RESPIRATORY_TRACT
  Filled 2019-03-19: qty 6.7

## 2019-03-19 MED ORDER — TRAZODONE HCL 50 MG PO TABS
50.0000 mg | ORAL_TABLET | Freq: Every day | ORAL | Status: DC
  Administered 2019-03-20 – 2019-03-22 (×4): 50 mg via ORAL
  Filled 2019-03-19 (×4): qty 1

## 2019-03-19 MED ORDER — ALBUTEROL SULFATE (2.5 MG/3ML) 0.083% IN NEBU
2.5000 mg | INHALATION_SOLUTION | Freq: Once | RESPIRATORY_TRACT | Status: DC
  Filled 2019-03-19: qty 3

## 2019-03-19 MED ORDER — ACETAMINOPHEN 325 MG PO TABS: 650.0000 mg | ORAL_TABLET | Freq: Four times a day (QID) | ORAL | Status: DC | PRN

## 2019-03-19 MED ORDER — ACETAMINOPHEN 650 MG RE SUPP: 650.0000 mg | Freq: Four times a day (QID) | RECTAL | Status: DC | PRN

## 2019-03-19 MED ORDER — METHYLPREDNISOLONE SODIUM SUCC 125 MG IJ SOLR
80.0000 mg | Freq: Three times a day (TID) | INTRAMUSCULAR | Status: DC
  Administered 2019-03-20: 80 mg via INTRAVENOUS
  Filled 2019-03-19: qty 2

## 2019-03-19 MED ORDER — GUAIFENESIN ER 600 MG PO TB12
600.0000 mg | ORAL_TABLET | Freq: Two times a day (BID) | ORAL | Status: DC
  Administered 2019-03-20 – 2019-03-23 (×8): 600 mg via ORAL
  Filled 2019-03-19 (×8): qty 1

## 2019-03-19 MED ORDER — LISINOPRIL-HYDROCHLOROTHIAZIDE 10-12.5 MG PO TABS: 1.0000 | ORAL_TABLET | Freq: Every day | ORAL | Status: DC

## 2019-03-19 MED ORDER — IPRATROPIUM-ALBUTEROL 0.5-2.5 (3) MG/3ML IN SOLN: 3.0000 mL | Freq: Four times a day (QID) | RESPIRATORY_TRACT | Status: DC

## 2019-03-19 MED ORDER — PANTOPRAZOLE SODIUM 40 MG PO TBEC
40.0000 mg | DELAYED_RELEASE_TABLET | Freq: Every day | ORAL | Status: DC
  Administered 2019-03-20 – 2019-03-23 (×4): 40 mg via ORAL
  Filled 2019-03-19 (×4): qty 1

## 2019-03-19 MED ORDER — ALBUTEROL SULFATE (2.5 MG/3ML) 0.083% IN NEBU: 2.5000 mg | INHALATION_SOLUTION | Freq: Four times a day (QID) | RESPIRATORY_TRACT | Status: DC | PRN

## 2019-03-19 MED ORDER — GABAPENTIN 100 MG PO CAPS
200.0000 mg | ORAL_CAPSULE | Freq: Once | ORAL | Status: AC
  Administered 2019-03-19: 200 mg via ORAL
  Filled 2019-03-19: qty 2

## 2019-03-19 MED ORDER — MAGNESIUM SULFATE 2 GM/50ML IV SOLN
2.0000 g | Freq: Once | INTRAVENOUS | Status: AC
  Administered 2019-03-19: 2 g via INTRAVENOUS
  Filled 2019-03-19: qty 50

## 2019-03-19 NOTE — H&P (Signed)
TRH H&P    Patient Demographics:    Amy Rubio, is a 68 y.o. female  MRN: [id number]  DOB - 1951/02/18  Admit Date - 03/19/2019  Referring MD/NP/PA: Denton Meek  Outpatient Primary MD for the patient is Redmond School, MD  Patient coming from: home  Chief complaint- dyspnea, wheezing   HPI:    Amy Rubio  is a 68 y.o. female, w Hypertension, Anxiety/ Depression, Osteoporosis, Gerd, Smoker, Copd, apparently c/o dry cough, dyspnea and wheezing starting earlier today.  Pt denies fever, chills, cp, palp, n/v, abd pain, diarrhea, brbpr.    In ED T 97.6, P 71, R 19 Bp 148/63 pox 92% on RA resting, 88% w exertion Wt 45.4 kg  Wbc 4.4, Hgb 13.5, Plt 223 Na 137 K 3.6 Bun 9, Creatinine 0.54 Glucose 111  CXR FINDINGS: There is hyperinflation of the lungs compatible with COPD. Heart and mediastinal contours are within normal limits. No focal opacities or effusions. No acute bony abnormality.  IMPRESSION: COPD.  No active disease.  Pt received solumedrol 125mg  iv x1, duoneb x1, and albuterol neb x1 without decrease in wheezing.   Pt will be admitted for Copd exacerbation.    Review of systems:    In addition to the HPI above,  No Fever-chills, No Headache, No changes with Vision or hearing, No problems swallowing food or Liquids, No Chest pain No Abdominal pain, No Nausea or Vomiting, bowel movements are regular, No Blood in stool or Urine, No dysuria, No new skin rashes or bruises, No new joints pains-aches,  No new weakness, tingling, numbness in any extremity, No recent weight gain or loss, No polyuria, polydypsia or polyphagia, No significant Mental Stressors.  All other systems reviewed and are negative.    Past History of the following :    Past Medical History:  Diagnosis Date  . Adenomatous colon polyp 2007   Due surveillance colonoscopy 2012  . Anxiety   . Arthritis    . Chronic pain   . COPD (chronic obstructive pulmonary disease) ([place])   . Depression   . Dyspnea   . Esophageal stricture 07/2009  . Fibromyalgia   . GERD (gastroesophageal reflux disease)   . Headache(784.0)   . Heart murmur   . Hypertension   . Osteoporosis   . Polio    Right lower extremity weakness  . Post poliomyelitis syndrome       Past Surgical History:  Procedure Laterality Date  . ABDOMINAL HYSTERECTOMY     PARTIAL  . CATARACT EXTRACTION W/PHACO Left 01/26/2015   Procedure: CATARACT EXTRACTION PHACO AND INTRAOCULAR LENS PLACEMENT (IOC);  Surgeon: Rutherford Guys, MD;  Location: [facility];  Service: Ophthalmology;  Laterality: Left;  CDE:7.72  . CATARACT EXTRACTION W/PHACO Right 02/23/2015   Procedure: CATARACT EXTRACTION PHACO AND INTRAOCULAR LENS PLACEMENT (IOC);  Surgeon: Rutherford Guys, MD;  Location: [facility];  Service: Ophthalmology;  Laterality: Right;  CDE:10.68  . COLONOSCOPY  2007   adenoma (1.3cm). multiple small polyps ablated, diverticulosis  . COLONOSCOPY WITH PROPOFOL N/A 03/25/2018   Procedure:  COLONOSCOPY WITH PROPOFOL;  Surgeon: Daneil Dolin, MD;  Location: [facility];  Service: Endoscopy;  Laterality: N/A;  9:15am  . ESOPHAGOGASTRODUODENOSCOPY  07/23/09   distal thickened GEJ, peptic stricture narrowed lumen to 22mm, small hh, moderate gastritis (no H.Pylori), mild duodenitis.   Marland Kitchen ESOPHAGOGASTRODUODENOSCOPY  2013   Dr. Gala Romney: Possible occult cervical esophageal web status post dilation, small hiatal hernia.  Marland Kitchen HIP PINNING,CANNULATED Left 07/15/2017   Procedure: CANNULATED HIP PINNING;  Surgeon: Carole Civil, MD;  Location: [facility];  Service: Orthopedics;  Laterality: Left;  . PARTIAL HYSTERECTOMY    . POLYPECTOMY  03/25/2018   Procedure: POLYPECTOMY;  Surgeon: Daneil Dolin, MD;  Location: [facility];  Service: Endoscopy;;  sigmoid, ascending, cecal      Social History:      Social History   Tobacco Use  . Smoking status: Current Every  Day Smoker    Packs/day: 1.00    Years: 45.00    Pack years: 45.00    Types: Cigarettes  . Smokeless tobacco: Never Used  Substance Use Topics  . Alcohol use: No       Family History :     Family History  Problem Relation Age of Onset  . Colon cancer Father        43s  . Hypertension Father   . Lung cancer Sister        stage IV       Home Medications:   Prior to Admission medications   Medication Sig Start Date End Date Taking? Authorizing Provider  albuterol (PROVENTIL) (2.5 MG/3ML) 0.083% nebulizer solution Take 3 mLs (2.5 mg total) by nebulization every 4 (four) hours as needed for wheezing or shortness of breath. 12/19/18   [name], MD  albuterol (VENTOLIN HFA) 108 (90 Base) MCG/ACT inhaler INHALE 2 PUFFS INTO THE LUNGS EVERY 4 HOURS AS NEEDED FOR WHEEZING ORSHORTNESS OF BREATH. 02/14/17   Kathyrn Drown, MD  ALPRAZolam Duanne Moron) 0.5 MG tablet Take 1 tablet by mouth 3 (three) times daily as needed. 01/09/18   [provider]  FLUoxetine (PROZAC) 40 MG capsule Take 40 mg by mouth daily.    [provider]  guaiFENesin (MUCINEX) 600 MG 12 hr tablet Take 1 tablet (600 mg total) by mouth 2 (two) times daily. 12/19/18   [name], MD  lisinopril-hydrochlorothiazide (PRINZIDE,ZESTORETIC) 10-12.5 MG tablet Take 1 tablet by mouth daily. 01/02/18   [provider]  omeprazole (PRILOSEC) 20 MG capsule Take 1 capsule (20 mg total) by mouth daily. Patient not taking: Reported on 07/22/2018 08/18/16 08/18/20  Kathyrn Drown, MD  oxyCODONE-acetaminophen (PERCOCET) 7.5-325 MG tablet Take 1 tablet by mouth 4 (four) times daily.    [provider]  predniSONE (DELTASONE) 10 MG tablet Take 40mg  po daily for 2 days then 30mg  daily for 2 days then 20mg  daily for 2 days then 10mg  daily for 2 days then stop\ 12/19/18   [name], MD  pregabalin (LYRICA) 150 MG capsule Take 200 mg by mouth 2 (two) times daily.     [provider]   traZODone (DESYREL) 50 MG tablet Take 1 tablet (50 mg total) by mouth at bedtime. 02/14/17   Kathyrn Drown, MD  TRELEGY ELLIPTA 100-62.5-25 MCG/INH AEPB Take 1 puff by mouth daily. 01/29/18   [provider]  vitamin B-12 (CYANOCOBALAMIN) 250 MCG tablet Take 250 mcg by mouth daily.    [provider]  Vitamin D, Ergocalciferol, (DRISDOL) 50000 units CAPS capsule  Take 50,000 Units by mouth every 7 (seven) days.    [provider]     Allergies:    No Known Allergies   Physical Exam:   Vitals  Blood pressure (!) 148/63, pulse 71, temperature 97.6 F (36.4 C), temperature source Oral, resp. rate 16, height 5\' 2"  (1.575 m), weight 45.4 kg, SpO2 94 %.  1.  General: Axox3, thin elderly female  2. Psychiatric: euthymic  3. Neurologic: cn2-12 intact, reflexes 2+ symmetric, diffuse with no clonus, motor 5/5 in all 4 ext  4. HEENMT:  Anicteric, pupils 1.37mm symmetric, direct, consensual, near intact Neck: no jvd  5. Respiratory : + bilateral exp wheezing, few crackles bilbasilar base  6. Cardiovascular : rrr s1, s2, no m/g/r  7. Gastrointestinal:  Abd: soft, nt, nd, +bs  8. Skin:  Ext: no c/c/e, no rash  9.Musculoskeletal:  Good ROM  No adenopathy    Data Review:    CBC Recent Labs  Lab 03/19/19 2022  WBC 4.4  HGB 13.5  HCT 41.5  PLT 223  MCV 102.5*  MCH 33.3  MCHC 32.5  RDW 15.5   ------------------------------------------------------------------------------------------------------------------  Results for orders placed or performed during the hospital encounter of 03/19/19 (from the past 48 hour(s))  Basic metabolic panel     Status: Abnormal   Collection Time: 03/19/19  8:22 PM  Result Value Ref Range   Sodium 137 135 - 145 mmol/L   Potassium 3.6 3.5 - 5.1 mmol/L   Chloride 99 98 - 111 mmol/L   CO2 27 22 - 32 mmol/L   Glucose, Bld 111 (H) 70 - 99 mg/dL   BUN 9 8 - 23 mg/dL   Creatinine, Ser 0.54 0.44 - 1.00 mg/dL    Calcium 9.0 8.9 - 10.3 mg/dL   GFR calc non Af Amer >60 >60 mL/min   GFR calc Af Amer >60 >60 mL/min   Anion gap 11 5 - 15    Comment: Performed at [facility], [address], [place], [place] [address]  CBC     Status: Abnormal   Collection Time: 03/19/19  8:22 PM  Result Value Ref Range   WBC 4.4 4.0 - 10.5 K/uL   RBC 4.05 3.87 - 5.11 MIL/uL   Hemoglobin 13.5 12.0 - 15.0 g/dL   HCT 41.5 36.0 - 46.0 %   MCV 102.5 (H) 80.0 - 100.0 fL   MCH 33.3 26.0 - 34.0 pg   MCHC 32.5 30.0 - 36.0 g/dL   RDW 15.5 11.5 - 15.5 %   Platelets 223 150 - 400 K/uL   nRBC 0.0 0.0 - 0.2 %    Comment: Performed at [facility], [address], [place], [place] [address]  SARS Coronavirus 2 (CEPHEID- Performed in [place] hospital lab), Hosp Order     Status: None   Collection Time: 03/19/19  8:48 PM   Specimen: Nasopharyngeal Swab  Result Value Ref Range   SARS Coronavirus 2 NEGATIVE NEGATIVE    Comment: (NOTE) If result is NEGATIVE SARS-CoV-2 target nucleic acids are NOT DETECTED. The SARS-CoV-2 RNA is generally detectable in upper and lower  respiratory specimens during the acute phase of infection. The lowest  concentration of SARS-CoV-2 viral copies this assay can detect is 250  copies / mL. A negative result does not preclude SARS-CoV-2 infection  and should not be used as the sole basis for treatment or other  patient management decisions.  A negative result may occur with  improper specimen collection / handling,  submission of specimen other  than nasopharyngeal swab, presence of viral mutation(s) within the  areas targeted by this assay, and inadequate number of viral copies  (<250 copies / mL). A negative result must be combined with clinical  observations, patient history, and epidemiological information. If result is POSITIVE SARS-CoV-2 target nucleic acids are DETECTED. The SARS-CoV-2 RNA is generally detectable in upper and lower  respiratory specimens dur ing the acute phase of  infection.  Positive  results are indicative of active infection with SARS-CoV-2.  Clinical  correlation with patient history and other diagnostic information is  necessary to determine patient infection status.  Positive results do  not rule out bacterial infection or co-infection with other viruses. If result is PRESUMPTIVE POSTIVE SARS-CoV-2 nucleic acids MAY BE PRESENT.   A presumptive positive result was obtained on the submitted specimen  and confirmed on repeat testing.  While 2019 novel coronavirus  (SARS-CoV-2) nucleic acids may be present in the submitted sample  additional confirmatory testing may be necessary for epidemiological  and / or clinical management purposes  to differentiate between  SARS-CoV-2 and other Sarbecovirus currently known to infect humans.  If clinically indicated additional testing with an alternate test  methodology [phone]) is advised. The SARS-CoV-2 RNA is generally  detectable in upper and lower respiratory sp ecimens during the acute  phase of infection. The expected result is Negative. Fact Sheet for Patients:  StrictlyIdeas.no Fact Sheet for Healthcare Providers: BankingDealers.co.za This test is not yet approved or cleared by the [place] FDA and has been authorized for detection and/or diagnosis of SARS-CoV-2 by FDA under an Emergency Use Authorization (EUA).  This EUA will remain in effect (meaning this test can be used) for the duration of the COVID-19 declaration under Section 564(b)(1) of the Act, 21 U.S.C. section 360bbb-3(b)(1), unless the authorization is terminated or revoked sooner. Performed at [facility], [address] [place], [place] [address]     Chemistries  Recent Labs  Lab 03/19/19 2022  NA 137  K 3.6  CL 99  CO2 27  GLUCOSE 111*  BUN 9  CREATININE 0.54  CALCIUM 9.0    ------------------------------------------------------------------------------------------------------------------  ------------------------------------------------------------------------------------------------------------------ GFR: Estimated Creatinine Clearance: 48.2 mL/min (by C-G formula based on SCr of 0.54 mg/dL). Liver Function Tests: No results for input(s): AST, ALT, ALKPHOS, BILITOT, PROT, ALBUMIN in the last 168 hours. No results for input(s): LIPASE, AMYLASE in the last 168 hours. No results for input(s): AMMONIA in the last 168 hours. Coagulation Profile: No results for input(s): INR, PROTIME in the last 168 hours. Cardiac Enzymes: No results for input(s): CKTOTAL, CKMB, CKMBINDEX, TROPONINI in the last 168 hours. BNP (last 3 results) No results for input(s): PROBNP in the last 8760 hours. HbA1C: No results for input(s): HGBA1C in the last 72 hours. CBG: No results for input(s): GLUCAP in the last 168 hours. Lipid Profile: No results for input(s): CHOL, HDL, LDLCALC, TRIG, CHOLHDL, LDLDIRECT in the last 72 hours. Thyroid Function Tests: No results for input(s): TSH, T4TOTAL, FREET4, T3FREE, THYROIDAB in the last 72 hours. Anemia Panel: No results for input(s): VITAMINB12, FOLATE, FERRITIN, TIBC, IRON, RETICCTPCT in the last 72 hours.  --------------------------------------------------------------------------------------------------------------- Urine analysis: No results found for: COLORURINE, APPEARANCEUR, LABSPEC, PHURINE, GLUCOSEU, HGBUR, BILIRUBINUR, KETONESUR, PROTEINUR, UROBILINOGEN, NITRITE, LEUKOCYTESUR    Imaging Results:    Dg Chest Portable 1 View  Result Date: 03/19/2019 CLINICAL DATA:  Shortness of breath EXAM: PORTABLE CHEST 1 VIEW COMPARISON:  12/18/2018 FINDINGS: There is hyperinflation of the lungs compatible  with COPD. Heart and mediastinal contours are within normal limits. No focal opacities or effusions. No acute bony abnormality. IMPRESSION:  COPD.  No active disease. Electronically Signed   By: Rolm Baptise M.D.   On: 03/19/2019 21:22   nsr at 80, nl axis, nl pr int, no qtc, + lvh,  St depression in the v4-6 leads   Assessment & Plan:    Principal Problem:   COPD exacerbation (HCC) Active Problems:   CIGARETTE SMOKER   Essential hypertension, benign  Copd exacerbation Solumedrol 80mg  iv q8h zithromax 500mg  iv qday Albuterol 1 neb q6h and q6h prn  Cont Trelegy 1puff qday Cont guaifenesin 600mg  po bid Check cbc in am  Hypertension Cont lisinopril / hydrochlorothiazide Check cmp in am  Nicotine dep Nicotine patch 7mg  topically qday Pt counselled on smoking cessation x 3 minutes  Anxiety/ depression Cont Prozac 40mg  po qday  Chronic pain Cont Lyrica 200mg  po bid Cont Percocet 5/325mg  po bid prn   Gerd, hx of esophageal stricture Cont PPI     DVT Prophylaxis-   Lovenox - SCDs  AM Labs Ordered, also please review Full Orders  Family Communication: Admission, patients condition and plan of care including tests being ordered have been discussed with the patient who indicate understanding and agree with the plan and Code Status.  Code Status:  FULL CODE  Admission status: Observation/: Based on patients clinical presentation and evaluation of above clinical data, I have made determination that patient meets observation criteria at this time. Pt will require solumedrol iv along with zithromax iv for Copd exacerbation. Pt will require admission for observation due to hypoxia.   Time spent in minutes : 55    Jani Gravel M.D on 03/19/2019 at 11:53 PM

## 2019-03-19 NOTE — ED Triage Notes (Signed)
Pt c/o increased sob that started today.

## 2019-03-19 NOTE — ED Provider Notes (Signed)
[facility] EMERGENCY DEPARTMENT Provider Note   CSN: [id number] Arrival date & time: 03/19/19  2008     History   Chief Complaint Chief Complaint  Patient presents with   Shortness of Breath    HPI Amy Rubio is a 68 y.o. female.     Patient complains of shortness of breath and wheezing,  The history is provided by the patient. No language interpreter was used.  Shortness of Breath Severity:  Moderate Onset quality:  Sudden Timing:  Constant Progression:  Worsening Chronicity:  New Context: activity   Relieved by:  Nothing Worsened by:  Nothing Ineffective treatments:  Inhaler Associated symptoms: wheezing   Associated symptoms: no abdominal pain, no chest pain, no cough, no headaches and no rash     Past Medical History:  Diagnosis Date   Adenomatous colon polyp 2007   Due surveillance colonoscopy 2012   Anxiety    Arthritis    Chronic pain    COPD (chronic obstructive pulmonary disease) ([place])    Depression    Dyspnea    Esophageal stricture 07/2009   Fibromyalgia    GERD (gastroesophageal reflux disease)    Headache(784.0)    Heart murmur    Hypertension    Osteoporosis    Polio    Right lower extremity weakness   Post poliomyelitis syndrome     Patient Active Problem List   Diagnosis Date Noted   COPD (chronic obstructive pulmonary disease) ([place]) 12/19/2018   COPD with acute exacerbation ([place]) 12/18/2018   CAP (community acquired pneumonia) 12/18/2018   Acute respiratory failure with hypoxia ([place]) 12/18/2018   Constipation 01/31/2018   S/P ORIF (open reduction internal fixation) fracture( cannulated hip pins ) 07/15/17 07/30/2017   Hypokalemia 07/15/2017   Hyponatremia 07/15/2017   Closed left hip fracture, initial encounter ([place]) 07/15/2017   Anxiety 07/15/2017   Visual field defect 12/13/2015   Senile purpura ([place]) 06/14/2015   Hyperlipidemia 10/30/2013   Osteoporosis 07/31/2013   Essential  hypertension, benign 07/31/2013   Osteoarthritis 07/31/2013   Chronic pain syndrome 04/24/2013   Esophageal dysphagia 10/30/2011   FH: colon cancer 10/30/2011   Abnormal CT scan, esophagus 10/30/2011   ESOPHAGEAL STRICTURE 08/24/2009   COLONIC POLYPS, ADENOMATOUS, HX OF 08/24/2009   CIGARETTE SMOKER 08/18/2009   COPD exacerbation ([place]) 08/18/2009   WEIGHT LOSS, RECENT 08/18/2009   CHEST PAIN UNSPECIFIED 08/18/2009    Past Surgical History:  Procedure Laterality Date   ABDOMINAL HYSTERECTOMY     PARTIAL   CATARACT EXTRACTION W/PHACO Left 01/26/2015   Procedure: CATARACT EXTRACTION PHACO AND INTRAOCULAR LENS PLACEMENT ([place]);  Surgeon: Rutherford Guys, MD;  Location: [facility];  Service: Ophthalmology;  Laterality: Left;  CDE:7.72   CATARACT EXTRACTION W/PHACO Right 02/23/2015   Procedure: CATARACT EXTRACTION PHACO AND INTRAOCULAR LENS PLACEMENT (I[facility]);  Surgeon: Rutherford Guys, MD;  Location: [facility];  Service: Ophthalmology;  Laterality: Right;  CDE:10.68   COLONOSCOPY  2007   adenoma (1.3cm). multiple small polyps ablated, diverticulosis   COLONOSCOPY WITH PROPOFOL N/A 03/25/2018   Procedure: COLONOSCOPY WITH PROPOFOL;  Surgeon: Daneil Dolin, MD;  Location: [facility] SU[facility];  Service: Endoscopy;  Laterality: N/A;  9:15am   ESOPHAGOGASTRODUODENOSCOPY  07/23/09   distal thickened GEJ, peptic stricture narrowed lumen to 1mm, small hh, moderate gastritis (no H.Pylori), mild duodenitis.    ESOPHAGOGASTRODUODENOSCOPY  2013   Dr. Gala Romney: Possible occult cervical esophageal web status post dilation, small hiatal hernia.   HIP PINNING,CANNULATED Left 07/15/2017   Procedure: CANNULATED HIP PINNING;  Surgeon: Carole Civil, MD;  Location: [facility];  Service: Orthopedics;  Laterality: Left;   PARTIAL HYSTERECTOMY     POLYPECTOMY  03/25/2018   Procedure: POLYPECTOMY;  Surgeon: Daneil Dolin, MD;  Location: [facility];  Service: Endoscopy;;  sigmoid, ascending, cecal     OB  History   No obstetric history on file.      Home Medications    Prior to Admission medications   Medication Sig Start Date End Date Taking? Authorizing Provider  albuterol (PROVENTIL) (2.5 MG/3ML) 0.083% nebulizer solution Take 3 mLs (2.5 mg total) by nebulization every 4 (four) hours as needed for wheezing or shortness of breath. 12/19/18   [name], MD  albuterol (VENTOLIN HFA) 108 (90 Base) MCG/ACT inhaler INHALE 2 PUFFS INTO THE LUNGS EVERY 4 HOURS AS NEEDED FOR WHEEZING ORSHORTNESS OF BREATH. 02/14/17   Kathyrn Drown, MD  ALPRAZolam Duanne Moron) 0.5 MG tablet Take 1 tablet by mouth 3 (three) times daily as needed. 01/09/18   [provider]  FLUoxetine (PROZAC) 40 MG capsule Take 40 mg by mouth daily.    [provider]  guaiFENesin (MUCINEX) 600 MG 12 hr tablet Take 1 tablet (600 mg total) by mouth 2 (two) times daily. 12/19/18   [name], MD  lisinopril-hydrochlorothiazide (PRINZIDE,ZESTORETIC) 10-12.5 MG tablet Take 1 tablet by mouth daily. 01/02/18   [provider]  omeprazole (PRILOSEC) 20 MG capsule Take 1 capsule (20 mg total) by mouth daily. Patient not taking: Reported on 07/22/2018 08/18/16 08/18/20  Kathyrn Drown, MD  oxyCODONE-acetaminophen (PERCOCET) 7.5-325 MG tablet Take 1 tablet by mouth 4 (four) times daily.    [provider]  predniSONE (DELTASONE) 10 MG tablet Take 40mg  po daily for 2 days then 30mg  daily for 2 days then 20mg  daily for 2 days then 10mg  daily for 2 days then stop\ 12/19/18   [name], MD  pregabalin (LYRICA) 150 MG capsule Take 200 mg by mouth 2 (two) times daily.     [provider]  traZODone (DESYREL) 50 MG tablet Take 1 tablet (50 mg total) by mouth at bedtime. 02/14/17   Kathyrn Drown, MD  TRELEGY ELLIPTA 100-62.5-25 MCG/INH AEPB Take 1 puff by mouth daily. 01/29/18   [provider]  vitamin B-12 (CYANOCOBALAMIN) 250 MCG tablet Take 250 mcg by mouth daily.    [provider]  Vitamin D, Ergocalciferol, (DRISDOL) 50000 units CAPS capsule Take 50,000 Units by mouth every 7 (seven) days.    [provider]    Family History Family History  Problem Relation Age of Onset   Colon cancer Father        63s   Hypertension Father    Lung cancer Sister        stage IV    Social History Social History   Tobacco Use   Smoking status: Current Every Day Smoker    Packs/day: 1.00    Years: 45.00    Pack years: 45.00    Types: Cigarettes   Smokeless tobacco: Never Used  Substance Use Topics   Alcohol use: No   Drug use: No     Allergies   Patient has no known allergies.   Review of Systems Review of Systems  Constitutional: Negative for appetite change and fatigue.  HENT: Negative for congestion, ear discharge and sinus pressure.   Eyes: Negative for discharge.  Respiratory: Positive for shortness of breath and wheezing. Negative for cough.   Cardiovascular: Negative for chest  pain.  Gastrointestinal: Negative for abdominal pain and diarrhea.  Genitourinary: Negative for frequency and hematuria.  Musculoskeletal: Negative for back pain.  Skin: Negative for rash.  Neurological: Negative for seizures and headaches.  Psychiatric/Behavioral: Negative for hallucinations.     Physical Exam Updated Vital Signs BP (!) 141/66    Pulse 74    Resp 14    Ht 5\' 2"  (1.575 m)    Wt 45.4 kg    SpO2 94%    BMI 18.29 kg/m   Physical Exam Vitals signs reviewed.  Constitutional:      Appearance: Normal appearance. She is well-developed.  HENT:     Head: Normocephalic.     Nose: Nose normal.  Eyes:     General: No scleral icterus.    Conjunctiva/sclera: Conjunctivae normal.  Neck:     Musculoskeletal: Neck supple.     Thyroid: No thyromegaly.  Cardiovascular:     Rate and Rhythm: Normal rate and regular rhythm.     Heart sounds: No murmur. No friction rub. No gallop.   Pulmonary:     Breath sounds: No stridor. No wheezing  or rales.  Chest:     Chest wall: No tenderness.  Abdominal:     General: There is no distension.     Tenderness: There is no abdominal tenderness. There is no rebound.  Musculoskeletal: Normal range of motion.  Lymphadenopathy:     Cervical: No cervical adenopathy.  Skin:    Findings: No erythema or rash.  Neurological:     Mental Status: She is alert and oriented to person, place, and time.     Motor: No abnormal muscle tone.     Coordination: Coordination normal.  Psychiatric:        Behavior: Behavior normal.      ED Treatments / Results  Labs (all labs ordered are listed, but only abnormal results are displayed) Labs Reviewed  BASIC METABOLIC PANEL - Abnormal; Notable for the following components:      Result Value   Glucose, Bld 111 (*)    All other components within normal limits  CBC - Abnormal; Notable for the following components:   MCV 102.5 (*)    All other components within normal limits  SARS CORONAVIRUS 2 (HOSPITAL ORDER, [facility])  COMPREHENSIVE METABOLIC PANEL  CBC    EKG    Radiology Dg Chest Portable 1 View  Result Date: 03/19/2019 CLINICAL DATA:  Shortness of breath EXAM: PORTABLE CHEST 1 VIEW COMPARISON:  12/18/2018 FINDINGS: There is hyperinflation of the lungs compatible with COPD. Heart and mediastinal contours are within normal limits. No focal opacities or effusions. No acute bony abnormality. IMPRESSION: COPD.  No active disease. Electronically Signed   By: Rolm Baptise M.D.   On: 03/19/2019 21:22    Procedures Procedures (including critical care time)  Medications Ordered in ED Medications  ipratropium-albuterol (DUONEB) 0.5-2.5 (3) MG/3ML nebulizer solution 3 mL (has no administration in time range)  albuterol (PROVENTIL) (2.5 MG/3ML) 0.083% nebulizer solution 2.5 mg (has no administration in time range)  enoxaparin (LOVENOX) injection 40 mg (has no administration in time range)  sodium chloride flush (NS)  0.9 % injection 3 mL (has no administration in time range)  sodium chloride flush (NS) 0.9 % injection 3 mL (has no administration in time range)  0.9 %  sodium chloride infusion (has no administration in time range)  acetaminophen (TYLENOL) tablet 650 mg (has no administration in time range)    Or  acetaminophen (TYLENOL) suppository 650 mg (has no administration in time range)  methylPREDNISolone sodium succinate (SOLU-MEDROL) 125 mg/2 mL injection 80 mg (has no administration in time range)  ipratropium-albuterol (DUONEB) 0.5-2.5 (3) MG/3ML nebulizer solution 3 mL (has no administration in time range)  albuterol (PROVENTIL) (2.5 MG/3ML) 0.083% nebulizer solution 2.5 mg (has no administration in time range)  azithromycin (ZITHROMAX) 500 mg in sodium chloride 0.9 % 250 mL IVPB (has no administration in time range)  ondansetron (ZOFRAN) injection 4 mg (4 mg Intravenous Given 03/19/19 2023)  magnesium sulfate IVPB 2 g 50 mL (0 g Intravenous Stopped 03/19/19 2145)  methylPREDNISolone sodium succinate (SOLU-MEDROL) 125 mg/2 mL injection 125 mg (125 mg Intravenous Given 03/19/19 2053)  gabapentin (NEURONTIN) capsule 200 mg (200 mg Oral Given 03/19/19 2154)     Initial Impression / Assessment and Plan / ED Course  I have reviewed the triage vital signs and the nursing notes.  Pertinent labs & imaging results that were available during my care of the patient were reviewed by me and considered in my medical decision making (see chart for details).        Patient with exacerbation COPD.  Patient with wheezing and hypoxia.  She will be admitted to medicine for continued COPD treatment  Final Clinical Impressions(s) / ED Diagnoses   Final diagnoses:  COPD exacerbation [facility])    ED Discharge Orders    None       Milton Ferguson, MD 03/19/19 2255

## 2019-03-20 ENCOUNTER — Encounter (HOSPITAL_COMMUNITY): Payer: Self-pay

## 2019-03-20 DIAGNOSIS — Z800 Family history of malignant neoplasm of digestive organs: Secondary | ICD-10-CM | POA: Diagnosis not present

## 2019-03-20 DIAGNOSIS — Z825 Family history of asthma and other chronic lower respiratory diseases: Secondary | ICD-10-CM | POA: Diagnosis not present

## 2019-03-20 DIAGNOSIS — Z79899 Other long term (current) drug therapy: Secondary | ICD-10-CM | POA: Diagnosis not present

## 2019-03-20 DIAGNOSIS — Z8249 Family history of ischemic heart disease and other diseases of the circulatory system: Secondary | ICD-10-CM | POA: Diagnosis not present

## 2019-03-20 DIAGNOSIS — J9601 Acute respiratory failure with hypoxia: Secondary | ICD-10-CM | POA: Diagnosis present

## 2019-03-20 DIAGNOSIS — F329 Major depressive disorder, single episode, unspecified: Secondary | ICD-10-CM | POA: Diagnosis present

## 2019-03-20 DIAGNOSIS — G8929 Other chronic pain: Secondary | ICD-10-CM | POA: Diagnosis present

## 2019-03-20 DIAGNOSIS — Z7951 Long term (current) use of inhaled steroids: Secondary | ICD-10-CM | POA: Diagnosis not present

## 2019-03-20 DIAGNOSIS — L8990 Pressure ulcer of unspecified site, unspecified stage: Secondary | ICD-10-CM | POA: Insufficient documentation

## 2019-03-20 DIAGNOSIS — G14 Postpolio syndrome: Secondary | ICD-10-CM | POA: Diagnosis present

## 2019-03-20 DIAGNOSIS — K219 Gastro-esophageal reflux disease without esophagitis: Secondary | ICD-10-CM | POA: Diagnosis present

## 2019-03-20 DIAGNOSIS — Z86010 Personal history of colonic polyps: Secondary | ICD-10-CM | POA: Diagnosis not present

## 2019-03-20 DIAGNOSIS — R0602 Shortness of breath: Secondary | ICD-10-CM | POA: Diagnosis not present

## 2019-03-20 DIAGNOSIS — J441 Chronic obstructive pulmonary disease with (acute) exacerbation: Secondary | ICD-10-CM | POA: Diagnosis present

## 2019-03-20 DIAGNOSIS — Z8371 Family history of colonic polyps: Secondary | ICD-10-CM | POA: Diagnosis not present

## 2019-03-20 DIAGNOSIS — F17210 Nicotine dependence, cigarettes, uncomplicated: Secondary | ICD-10-CM | POA: Diagnosis present

## 2019-03-20 DIAGNOSIS — M797 Fibromyalgia: Secondary | ICD-10-CM | POA: Diagnosis present

## 2019-03-20 DIAGNOSIS — L89151 Pressure ulcer of sacral region, stage 1: Secondary | ICD-10-CM | POA: Diagnosis present

## 2019-03-20 DIAGNOSIS — Z801 Family history of malignant neoplasm of trachea, bronchus and lung: Secondary | ICD-10-CM | POA: Diagnosis not present

## 2019-03-20 DIAGNOSIS — Z8719 Personal history of other diseases of the digestive system: Secondary | ICD-10-CM | POA: Diagnosis not present

## 2019-03-20 DIAGNOSIS — Z20828 Contact with and (suspected) exposure to other viral communicable diseases: Secondary | ICD-10-CM | POA: Diagnosis present

## 2019-03-20 DIAGNOSIS — I10 Essential (primary) hypertension: Secondary | ICD-10-CM | POA: Diagnosis present

## 2019-03-20 DIAGNOSIS — M810 Age-related osteoporosis without current pathological fracture: Secondary | ICD-10-CM | POA: Diagnosis present

## 2019-03-20 DIAGNOSIS — F419 Anxiety disorder, unspecified: Secondary | ICD-10-CM | POA: Diagnosis present

## 2019-03-20 LAB — CBC
HCT: 40.8 % (ref 36.0–46.0)
Hemoglobin: 12.8 g/dL (ref 12.0–15.0)
MCH: 32.6 pg (ref 26.0–34.0)
MCHC: 31.4 g/dL (ref 30.0–36.0)
MCV: 103.8 fL — ABNORMAL HIGH (ref 80.0–100.0)
Platelets: 214 K/uL (ref 150–400)
RBC: 3.93 MIL/uL (ref 3.87–5.11)
RDW: 15.3 % (ref 11.5–15.5)
WBC: 2.6 K/uL — ABNORMAL LOW (ref 4.0–10.5)
nRBC: 0 % (ref 0.0–0.2)

## 2019-03-20 LAB — COMPREHENSIVE METABOLIC PANEL
ALT: 17 U/L (ref 0–44)
AST: 18 U/L (ref 15–41)
Albumin: 3.7 g/dL (ref 3.5–5.0)
Alkaline Phosphatase: 45 U/L (ref 38–126)
Anion gap: 9 (ref 5–15)
BUN: 11 mg/dL (ref 8–23)
CO2: 26 mmol/L (ref 22–32)
Calcium: 8.9 mg/dL (ref 8.9–10.3)
Chloride: 101 mmol/L (ref 98–111)
Creatinine, Ser: 0.49 mg/dL (ref 0.44–1.00)
GFR calc Af Amer: >60 mL/min (ref >60)
GFR calc non Af Amer: >60 mL/min (ref >60)
Glucose, Bld: 130 mg/dL — ABNORMAL HIGH (ref 70–99)
Potassium: 4.3 mmol/L (ref 3.5–5.1)
Sodium: 136 mmol/L (ref 135–145)
Total Bilirubin: 0.6 mg/dL (ref 0.3–1.2)
Total Protein: 6.2 g/dL — ABNORMAL LOW (ref 6.5–8.1)

## 2019-03-20 MED ORDER — OXYCODONE-ACETAMINOPHEN 5-325 MG PO TABS
1.0000 | ORAL_TABLET | Freq: Four times a day (QID) | ORAL | Status: DC | PRN
  Administered 2019-03-20 – 2019-03-23 (×11): 1 via ORAL
  Filled 2019-03-20 (×11): qty 1

## 2019-03-20 MED ORDER — ADULT MULTIVITAMIN W/MINERALS CH
1.0000 | ORAL_TABLET | Freq: Every day | ORAL | Status: DC
  Administered 2019-03-20 – 2019-03-23 (×4): 1 via ORAL
  Filled 2019-03-20 (×4): qty 1

## 2019-03-20 MED ORDER — UMECLIDINIUM BROMIDE 62.5 MCG/INH IN AEPB
1.0000 | INHALATION_SPRAY | Freq: Every day | RESPIRATORY_TRACT | Status: DC
  Administered 2019-03-20 – 2019-03-23 (×4): 1 via RESPIRATORY_TRACT
  Filled 2019-03-20: qty 7

## 2019-03-20 MED ORDER — METHYLPREDNISOLONE SODIUM SUCC 40 MG IJ SOLR
40.0000 mg | Freq: Three times a day (TID) | INTRAMUSCULAR | Status: DC
  Administered 2019-03-20 – 2019-03-22 (×6): 40 mg via INTRAVENOUS
  Filled 2019-03-20 (×6): qty 1

## 2019-03-20 MED ORDER — HYDROCHLOROTHIAZIDE 12.5 MG PO CAPS
12.5000 mg | ORAL_CAPSULE | Freq: Every day | ORAL | Status: DC
  Administered 2019-03-20 – 2019-03-23 (×4): 12.5 mg via ORAL
  Filled 2019-03-20 (×4): qty 1

## 2019-03-20 MED ORDER — ENSURE ENLIVE PO LIQD
237.0000 mL | Freq: Two times a day (BID) | ORAL | Status: DC
  Administered 2019-03-20 – 2019-03-23 (×6): 237 mL via ORAL

## 2019-03-20 MED ORDER — LISINOPRIL 10 MG PO TABS
10.0000 mg | ORAL_TABLET | Freq: Every day | ORAL | Status: DC
  Administered 2019-03-20 – 2019-03-23 (×4): 10 mg via ORAL
  Filled 2019-03-20 (×4): qty 1

## 2019-03-20 MED ORDER — FLUTICASONE FUROATE-VILANTEROL 100-25 MCG/INH IN AEPB
1.0000 | INHALATION_SPRAY | Freq: Every day | RESPIRATORY_TRACT | Status: DC
  Administered 2019-03-20 – 2019-03-23 (×4): 1 via RESPIRATORY_TRACT
  Filled 2019-03-20: qty 28

## 2019-03-20 NOTE — Progress Notes (Signed)
Initial Nutrition Assessment  DOCUMENTATION CODES:  Underweight  INTERVENTION:  Ensure Enlive po BID, each supplement provides 350 kcal and 20 grams of protein  MVI with minerals  Received V.O. to liberalize diet to Regular.   NUTRITION DIAGNOSIS:  Increased nutrient needs related to wound healing, chronic illness (COPD) as evidenced by estimated needs for these coexisting conditions  GOAL:  Patient will meet greater than or equal to 90% of their needs  MONITOR:  Labs, Skin, Supplement acceptance, I & O's, PO intake, Weight trends, Diet advancement  REASON FOR ASSESSMENT:  Malnutrition Screening Tool    ASSESSMENT:  68 y/o female PMHx HTN, Anxiety, Depression, Gerd, Osteoporosis, COPD and ongoing tobacco abuse. Presents to ED w/ dry cough, SOB and wheezing x1 day. In ED, 02 56% w/ excertion. Admitted for COPD exacerbation.   RD operating remotely d/t covid precautions. RD able to reach patient by phone.   Pt reports she has a good appetite at home, however upon further questioning she admits she may not eat until dinner time. She sometimes will only eat a meal and maybe a snack for an entire day. She has started taking Boost at home; she will mix it with choc ice cream. She does not take any vitamins or mineral supplements. She does not follow any therapeutic diet restrictions and eats what she wants. When asked if her COPD impacts her intake, she says, "not until recently". She does feel her COPD is getting worse and realizes "I need to stop smoking".   Weight wise, she is currently 100.5 lbs. Chart shows a slight decline over past several years. She appears to have lost ~10 lbs in the last year or so. Her BMI is now falls into "underweight".   RD reviewed need to increase oral intake. She quickly aks, "well can I have real people food?". She is referring to the [place] restrictions. She is very displeased with them. She says the regular diet would help her eat more. Given her poor  oral intake, gradual weight loss and early wound, RD felt this was appropriate and asked MD if in agreement. He gave VO to liberalize diet. Patient also agreed to Ensure while admitted.   Labs: Glu: 130., MCV:103.8, WBC:2.6 Meds: Methylprednisolone, PPI, IV abx,   Recent Labs  Lab 03/19/19 2022 03/20/19 0423  NA 137 136  K 3.6 4.3  CL 99 101  CO2 27 26  BUN 9 11  CREATININE 0.54 0.49  CALCIUM 9.0 8.9  GLUCOSE 111* 130*   NUTRITION - FOCUSED PHYSICAL EXAM: Unable to conduct  Diet Order:   Diet Order            Diet regular Room service appropriate? Yes; Fluid consistency: Thin  Diet effective now             EDUCATION NEEDS:  No education needs have been identified at this time  Skin:   Pressure Ulcer Stage 1 to sacrum  Last BM:  6/17  Height:  Ht Readings from Last 1 Encounters:  03/19/19 5\' 2"  (1.575 m)   Weight:  Wt Readings from Last 1 Encounters:  03/20/19 45.6 kg    Wt Readings from Last 10 Encounters:  03/20/19 45.6 kg  12/18/18 48 kg  07/22/18 49.9 kg  03/25/18 49.9 kg  03/19/18 49.9 kg  01/31/18 49.4 kg  10/10/17 49 kg  08/29/17 49 kg  07/31/17 47.9 kg  07/19/17 50.3 kg   Ideal Body Weight:  50 kg  BMI:  Body  mass index is 18.39 kg/m.  Estimated Nutritional Needs:  Kcal:  1500-1700 (33-37 kcal/kg bw Protein:  70-80g Pro (1.5-1.8g/kg bw) Fluid:  1.4-1.6L fluid (30-35 ml/kg bw)  Burtis Junes RD, LDN, CNSC Clinical Nutrition Available Tues-Sat via Pager: [phone] 03/20/2019 4:58 PM

## 2019-03-20 NOTE — Progress Notes (Signed)
[facility] Progress Note  Patient: Amy Rubio PNT:[id number]   PCP: Redmond School, MD DOB: [birth year]   DOA: 03/19/2019   DOS: 03/20/2019   Date of Service: the patient was seen and examined on 03/20/2019  Brief hospital course: Pt. with PMH of COPD; admitted on 03/19/2019, presented with complaint of shortness of breath, was found to have COPD exacerbation secondary to acute bronchitis, COVID negative. Currently further plan is continue current care.  Subjective: Patient continues to have shortness of breath and cough.  Also has wheezing.  No nausea no vomiting.  Minimal appetite.  No diarrhea.  Assessment and Plan: 1. Acute COPD exacerbation. Continue with IV steroids, continue antibiotic.  Continue duo nebs. Continue Mucinex. Coag negative. Continue to monitor on telemetry.  Hypertension Cont lisinopril / hydrochlorothiazide Check cmp in am  Nicotine dep Nicotine patch 7mg  topically qday Pt counselled on smoking cessation x 3 minutes  Anxiety/ depression Cont Prozac 40mg  po qday  Chronic pain Cont Lyrica 200mg  po bid Cont Percocet 5/325mg  po 4 times daily as needed  Gerd, hx of esophageal stricture Cont PPI  Body mass index is 18.39 kg/m.  Nutrition Problem: Increased nutrient needs Etiology: wound healing, chronic illness Interventions:    Pressure Injury 03/20/19 Sacrum Right;Posterior Stage I -  Intact skin with non-blanchable redness of a localized area usually over a bony prominence. (Active)  03/20/19 0005  Location: Sacrum  Location Orientation: Right;Posterior  Staging: Stage I -  Intact skin with non-blanchable redness of a localized area usually over a bony prominence.  Wound Description (Comments):   Present on Admission: Yes     Diet: Regular diet DVT Prophylaxis: Subcutaneous Heparin   Advance goals of care discussion: Full code  Family Communication: no family was present at bedside, at the time of interview.  Disposition:   Discharge to Home .  Consultants: none Procedures: none  Scheduled Meds: . albuterol  2.5 mg Nebulization Once  . albuterol  2.5 mg Nebulization Q6H  . enoxaparin (LOVENOX) injection  40 mg Subcutaneous Q24H  . feeding supplement (ENSURE ENLIVE)  237 mL Oral BID BM  . FLUoxetine  40 mg Oral Daily  . fluticasone furoate-vilanterol  1 puff Inhalation Daily   And  . umeclidinium bromide  1 puff Inhalation Daily  . guaiFENesin  600 mg Oral BID  . lisinopril  10 mg Oral Daily   And  . hydrochlorothiazide  12.5 mg Oral Daily  . ipratropium-albuterol  3 mL Nebulization Once  . methylPREDNISolone (SOLU-MEDROL) injection  40 mg Intravenous Q8H  . multivitamin with minerals  1 tablet Oral Daily  . pantoprazole  40 mg Oral Daily  . pregabalin  200 mg Oral BID  . sodium chloride flush  3 mL Intravenous Q12H  . traZODone  50 mg Oral QHS   Continuous Infusions: . sodium chloride    . azithromycin 500 mg (03/20/19 0019)   PRN Meds: sodium chloride, acetaminophen **OR** acetaminophen, albuterol, oxyCODONE-acetaminophen, sodium chloride flush Antibiotics: Anti-infectives (From admission, onward)   Start     Dose/Rate Route Frequency Ordered Stop   03/19/19 2300  azithromycin (ZITHROMAX) 500 mg in sodium chloride 0.9 % 250 mL IVPB     500 mg 250 mL/hr over 60 Minutes Intravenous Every 24 hours 03/19/19 2252         Objective: Physical Exam: Vitals:   03/20/19 0744 03/20/19 1140 03/20/19 1416 03/20/19 1453  BP:    (!) 108/52  Pulse:    70  Resp:  16  Temp:    97.7 F (36.5 C)  TempSrc:    Oral  SpO2: 90% 96% (!) 89% 99%  Weight:      Height:        Intake/Output Summary (Last 24 hours) at 03/20/2019 1755 Last data filed at 03/20/2019 1200 Gross per 24 hour  Intake 780 ml  Output 1 ml  Net 779 ml   Filed Weights   03/19/19 2003 03/20/19 0500  Weight: 45.4 kg 45.6 kg   General: alert and oriented to time, place, and person. Appear in marked distress, affect appropriate  Eyes: PERRL, Conjunctiva normal ENT: Oral Mucosa Clear, moist  Neck: no JVD, no Abnormal Mass Or lumps Cardiovascular: S1 and S2 Present, no Murmur, peripheral pulses symmetrical Respiratory: Increased respiratory effort, Bilateral Air entry equal and Decreased, no use of accessory muscle, bilateral crackles, bilateral expiratory wheezes Abdomen: Bowel Sound present, Soft and no tenderness, no hernia Skin: no rashes  Extremities: no Pedal edema, no calf tenderness Neurologic: normal without focal findings, mental status, speech normal, alert and oriented x3, PERLA, Motor strength 5/5 and symmetric and sensation grossly normal to light touch Gait not checked due to patient safety concerns  Data Reviewed: CBC: Recent Labs  Lab 03/19/19 2022 03/20/19 0423  WBC 4.4 2.6*  HGB 13.5 12.8  HCT 41.5 40.8  MCV 102.5* 103.8*  PLT 223 765   Basic Metabolic Panel: Recent Labs  Lab 03/19/19 2022 03/20/19 0423  NA 137 136  K 3.6 4.3  CL 99 101  CO2 27 26  GLUCOSE 111* 130*  BUN 9 11  CREATININE 0.54 0.49  CALCIUM 9.0 8.9    Liver Function Tests: Recent Labs  Lab 03/20/19 0423  AST 18  ALT 17  ALKPHOS 45  BILITOT 0.6  PROT 6.2*  ALBUMIN 3.7   No results for input(s): LIPASE, AMYLASE in the last 168 hours. No results for input(s): AMMONIA in the last 168 hours. Coagulation Profile: No results for input(s): INR, PROTIME in the last 168 hours. Cardiac Enzymes: No results for input(s): CKTOTAL, CKMB, CKMBINDEX, TROPONINI in the last 168 hours. BNP (last 3 results) No results for input(s): PROBNP in the last 8760 hours. CBG: No results for input(s): GLUCAP in the last 168 hours. Studies: Dg Chest Portable 1 View  Result Date: 03/19/2019 CLINICAL DATA:  Shortness of breath EXAM: PORTABLE CHEST 1 VIEW COMPARISON:  12/18/2018 FINDINGS: There is hyperinflation of the lungs compatible with COPD. Heart and mediastinal contours are within normal limits. No focal opacities or  effusions. No acute bony abnormality. IMPRESSION: COPD.  No active disease. Electronically Signed   By: Rolm Baptise M.D.   On: 03/19/2019 21:22     Time spent: 35 minutes  Author: Berle Mull, MD [facility] 03/20/2019 5:55 PM  To reach On-call, see care teams to locate the attending and reach out to them via [email or web address]. If 7PM-7AM, please contact night-coverage If you still have difficulty reaching the attending provider, please page the [facility] (Director on Call) for [facility] on amion for assistance.

## 2019-03-21 ENCOUNTER — Inpatient Hospital Stay (HOSPITAL_COMMUNITY): Payer: Medicare Other

## 2019-03-21 LAB — BASIC METABOLIC PANEL
Anion gap: 9 (ref 5–15)
BUN: 23 mg/dL (ref 8–23)
CO2: 26 mmol/L (ref 22–32)
Calcium: 8.9 mg/dL (ref 8.9–10.3)
Chloride: 102 mmol/L (ref 98–111)
Creatinine, Ser: 0.49 mg/dL (ref 0.44–1.00)
GFR calc Af Amer: >60 mL/min (ref >60)
GFR calc non Af Amer: >60 mL/min (ref >60)
Glucose, Bld: 119 mg/dL — ABNORMAL HIGH (ref 70–99)
Potassium: 4.1 mmol/L (ref 3.5–5.1)
Sodium: 137 mmol/L (ref 135–145)

## 2019-03-21 LAB — CBC
HCT: 36.4 % (ref 36.0–46.0)
Hemoglobin: 11.6 g/dL — ABNORMAL LOW (ref 12.0–15.0)
MCH: 32.8 pg (ref 26.0–34.0)
MCHC: 31.9 g/dL (ref 30.0–36.0)
MCV: 102.8 fL — ABNORMAL HIGH (ref 80.0–100.0)
Platelets: 225 10*3/uL (ref 150–400)
RBC: 3.54 MIL/uL — ABNORMAL LOW (ref 3.87–5.11)
RDW: 15.3 % (ref 11.5–15.5)
WBC: 7.8 10*3/uL (ref 4.0–10.5)
nRBC: 0 % (ref 0.0–0.2)

## 2019-03-21 LAB — SEDIMENTATION RATE: Sed Rate: 2 mm/hr (ref 0–22)

## 2019-03-21 LAB — CK: Total CK: 68 U/L (ref 38–234)

## 2019-03-21 LAB — C-REACTIVE PROTEIN: CRP: 0.9 mg/dL (ref <1.0)

## 2019-03-21 LAB — PROCALCITONIN: Procalcitonin: <0.1 ng/mL

## 2019-03-21 MED ORDER — SODIUM CHLORIDE 0.9 % IV SOLN
1.0000 g | INTRAVENOUS | Status: DC
  Administered 2019-03-21: 1 g via INTRAVENOUS
  Filled 2019-03-21: qty 10

## 2019-03-21 NOTE — Progress Notes (Signed)
[facility] Progress Note  Patient: Amy Rubio YKD:[id number]   PCP: Redmond School, MD DOB: [birth year]   DOA: 03/19/2019   DOS: 03/21/2019   Date of Service: the patient was seen and examined on 03/21/2019  Brief hospital course: Pt. with PMH of COPD; admitted on 03/19/2019, presented with complaint of shortness of breath, was found to have COPD exacerbation secondary to acute bronchitis, COVID negative. Currently further plan is continue current care.  Subjective: Continues to have generalized body ache.  Continues to report shortness of breath.  No chest pain abdominal pain.  No fever no chills.  Assessment and Plan: 1. Acute COPD exacerbation. Acute hypoxic respiratory failure. Continue with IV steroids, continue antibiotic.  Continue duo nebs. Continue Mucinex. COVID negative. Her oxygenation requirement worsen over last 24 hours.  We will continue with IV steroids.  Hypertension Cont lisinopril / hydrochlorothiazide Check cmp in am  Nicotine dep Nicotine patch 7mg  topically qday Pt counselled on smoking cessation x 3 minutes  Anxiety/ depression Cont Prozac 40mg  po qday  Chronic pain Cont Lyrica 200mg  po bid Cont Percocet 5/325mg  po 4 times daily as needed Patient is requesting to increase her pain medication although with her worsening respiratory status and for generalized body ache further increase in the pain medication is not indicated. Patient is already on Lyrica as well as 3 antidepressant medication and therefore would not be able to add any Cymbalta or amitriptyline.  Gerd, hx of esophageal stricture Cont PPI  Body mass index is 18.1 kg/m.  Nutrition Problem: Increased nutrient needs Etiology: wound healing, chronic illness Interventions:    Pressure Injury 03/20/19 Sacrum Right;Posterior Stage I -  Intact skin with non-blanchable redness of a localized area usually over a bony prominence. (Active)  03/20/19 0005  Location: Sacrum  Location  Orientation: Right;Posterior  Staging: Stage I -  Intact skin with non-blanchable redness of a localized area usually over a bony prominence.  Wound Description (Comments):   Present on Admission: Yes     Diet: Regular diet DVT Prophylaxis: Subcutaneous Heparin   Advance goals of care discussion: Full code  Family Communication: no family was present at bedside, at the time of interview.  Disposition:  Discharge to Home .  Consultants: none Procedures: none  Scheduled Meds: . albuterol  2.5 mg Nebulization Q6H  . enoxaparin (LOVENOX) injection  40 mg Subcutaneous Q24H  . feeding supplement (ENSURE ENLIVE)  237 mL Oral BID BM  . FLUoxetine  40 mg Oral Daily  . fluticasone furoate-vilanterol  1 puff Inhalation Daily   And  . umeclidinium bromide  1 puff Inhalation Daily  . guaiFENesin  600 mg Oral BID  . lisinopril  10 mg Oral Daily   And  . hydrochlorothiazide  12.5 mg Oral Daily  . methylPREDNISolone (SOLU-MEDROL) injection  40 mg Intravenous Q8H  . multivitamin with minerals  1 tablet Oral Daily  . pantoprazole  40 mg Oral Daily  . pregabalin  200 mg Oral BID  . sodium chloride flush  3 mL Intravenous Q12H  . traZODone  50 mg Oral QHS   Continuous Infusions: . sodium chloride    . azithromycin Stopped (03/20/19 2312)  . cefTRIAXone (ROCEPHIN)  IV Stopped (03/21/19 1351)   PRN Meds: sodium chloride, acetaminophen **OR** acetaminophen, albuterol, oxyCODONE-acetaminophen, sodium chloride flush Antibiotics: Anti-infectives (From admission, onward)   Start     Dose/Rate Route Frequency Ordered Stop   03/21/19 1200  cefTRIAXone (ROCEPHIN) 1 g in sodium chloride 0.9 % 100 mL  IVPB     1 g 200 mL/hr over 30 Minutes Intravenous Every 24 hours 03/21/19 1140     03/19/19 2300  azithromycin (ZITHROMAX) 500 mg in sodium chloride 0.9 % 250 mL IVPB     500 mg 250 mL/hr over 60 Minutes Intravenous Every 24 hours 03/19/19 2252         Objective: Physical Exam: Vitals:    03/20/19 2112 03/21/19 0515 03/21/19 0756 03/21/19 1335  BP: 136/68 (!) 129/55    Pulse: 70 (!) 54    Resp: 18 17    Temp: 98.2 F (36.8 C) 98.4 F (36.9 C)    TempSrc: Oral     SpO2: 97% 92% 93% 93%  Weight:  44.9 kg    Height:        Intake/Output Summary (Last 24 hours) at 03/21/2019 1742 Last data filed at 03/21/2019 1500 Gross per 24 hour  Intake 1313.04 ml  Output 900 ml  Net 413.04 ml   Filed Weights   03/19/19 2003 03/20/19 0500 03/21/19 0515  Weight: 45.4 kg 45.6 kg 44.9 kg   General: alert and oriented to time, place, and person. Appear in marked distress, affect appropriate Eyes: PERRL, Conjunctiva normal ENT: Oral Mucosa Clear, moist  Neck: no JVD, no Abnormal Mass Or lumps Cardiovascular: S1 and S2 Present, no Murmur, peripheral pulses symmetrical Respiratory: Increased respiratory effort, Bilateral Air entry equal and Decreased, no use of accessory muscle, bilateral crackles, bilateral expiratory wheezes Abdomen: Bowel Sound present, Soft and no tenderness, no hernia Skin: no rashes  Extremities: no Pedal edema, no calf tenderness Neurologic: normal without focal findings, mental status, speech normal, alert and oriented x3, PERLA, Motor strength 5/5 and symmetric and sensation grossly normal to light touch Gait not checked due to patient safety concerns  Data Reviewed: CBC: Recent Labs  Lab 03/19/19 2022 03/20/19 0423 03/21/19 0356  WBC 4.4 2.6* 7.8  HGB 13.5 12.8 11.6*  HCT 41.5 40.8 36.4  MCV 102.5* 103.8* 102.8*  PLT 223 214 564   Basic Metabolic Panel: Recent Labs  Lab 03/19/19 2022 03/20/19 0423 03/21/19 0356  NA 137 136 137  K 3.6 4.3 4.1  CL 99 101 102  CO2 27 26 26   GLUCOSE 111* 130* 119*  BUN 9 11 23   CREATININE 0.54 0.49 0.49  CALCIUM 9.0 8.9 8.9    Liver Function Tests: Recent Labs  Lab 03/20/19 0423  AST 18  ALT 17  ALKPHOS 45  BILITOT 0.6  PROT 6.2*  ALBUMIN 3.7   No results for input(s): LIPASE, AMYLASE in the last  168 hours. No results for input(s): AMMONIA in the last 168 hours. Coagulation Profile: No results for input(s): INR, PROTIME in the last 168 hours. Cardiac Enzymes: Recent Labs  Lab 03/21/19 1200  CKTOTAL 68   BNP (last 3 results) No results for input(s): PROBNP in the last 8760 hours. CBG: No results for input(s): GLUCAP in the last 168 hours. Studies: Dg Chest Port 1 View  Result Date: 03/21/2019 CLINICAL DATA:  Pt c/o worsening SOB today. Low O2 sats. HX HTN, GERD, COPD, smoker EXAM: PORTABLE CHEST - 1 VIEW COMPARISON:  03/19/2019 FINDINGS: Lungs are clear.  Prominent right nipple shadow as before. Heart size and mediastinal contours are within normal limits. Aortic Atherosclerosis (ICD10-170.0). No effusion.  No pneumothorax. Visualized bones unremarkable. IMPRESSION: No acute cardiopulmonary disease. Electronically Signed   By: Lucrezia Europe M.D.   On: 03/21/2019 14:05     Time spent: 35 minutes  Author: Berle Mull, MD [facility] 03/21/2019 5:42 PM  To reach On-call, see care teams to locate the attending and reach out to them via [email or web address]. If 7PM-7AM, please contact night-coverage If you still have difficulty reaching the attending provider, please page the [facility] (Director on Call) for [facility] on amion for assistance.

## 2019-03-21 NOTE — Care Management Important Message (Signed)
Important Message  Patient Details  Name: Amy Rubio MRN: [id number] Date of Birth: 06/24/1951   Medicare Important Message Given:  Yes(given to nurse to place at bedside due to contact precautions)     [name] 03/21/2019, 3:01 PM

## 2019-03-21 NOTE — Progress Notes (Signed)
When entering room this morning, patient told this RN that she felt SOB and was having trouble catching breath. Initial SpO2 83% on 4L nasal cannula. Encouraged patient to take slow, deep breaths, in nose and out mouth. SpO2 increased to 93%. Patient reports coughing up thin white mucus. Educated patient on reasons not to increase O2 d/t CO2 retention, verbalized understanding. MD aware, will continue to monitor.

## 2019-03-22 LAB — CBC
HCT: 35.2 % — ABNORMAL LOW (ref 36.0–46.0)
Hemoglobin: 11.1 g/dL — ABNORMAL LOW (ref 12.0–15.0)
MCH: 32.9 pg (ref 26.0–34.0)
MCHC: 31.5 g/dL (ref 30.0–36.0)
MCV: 104.5 fL — ABNORMAL HIGH (ref 80.0–100.0)
Platelets: 198 10*3/uL (ref 150–400)
RBC: 3.37 MIL/uL — ABNORMAL LOW (ref 3.87–5.11)
RDW: 15.7 % — ABNORMAL HIGH (ref 11.5–15.5)
WBC: 9.1 10*3/uL (ref 4.0–10.5)
nRBC: 0 % (ref 0.0–0.2)

## 2019-03-22 LAB — BASIC METABOLIC PANEL
Anion gap: 8 (ref 5–15)
BUN: 22 mg/dL (ref 8–23)
CO2: 28 mmol/L (ref 22–32)
Calcium: 8.7 mg/dL — ABNORMAL LOW (ref 8.9–10.3)
Chloride: 100 mmol/L (ref 98–111)
Creatinine, Ser: 0.47 mg/dL (ref 0.44–1.00)
GFR calc Af Amer: >60 mL/min (ref >60)
GFR calc non Af Amer: >60 mL/min (ref >60)
Glucose, Bld: 153 mg/dL — ABNORMAL HIGH (ref 70–99)
Potassium: 3.8 mmol/L (ref 3.5–5.1)
Sodium: 136 mmol/L (ref 135–145)

## 2019-03-22 MED ORDER — CEPHALEXIN 500 MG PO CAPS
500.0000 mg | ORAL_CAPSULE | Freq: Two times a day (BID) | ORAL | Status: AC
  Administered 2019-03-22 – 2019-03-23 (×3): 500 mg via ORAL
  Filled 2019-03-22 (×3): qty 1

## 2019-03-22 MED ORDER — METHYLPREDNISOLONE SODIUM SUCC 40 MG IJ SOLR
40.0000 mg | Freq: Three times a day (TID) | INTRAMUSCULAR | Status: AC
  Administered 2019-03-22: 13:00:00 40 mg via INTRAVENOUS
  Filled 2019-03-22: qty 1

## 2019-03-22 MED ORDER — METHOCARBAMOL 500 MG PO TABS
500.0000 mg | ORAL_TABLET | Freq: Three times a day (TID) | ORAL | Status: DC | PRN
  Administered 2019-03-22: 22:00:00 500 mg via ORAL
  Filled 2019-03-22: qty 1

## 2019-03-22 MED ORDER — PREDNISONE 20 MG PO TABS
50.0000 mg | ORAL_TABLET | Freq: Every day | ORAL | Status: DC
  Administered 2019-03-23: 50 mg via ORAL
  Filled 2019-03-22: qty 1

## 2019-03-22 NOTE — Progress Notes (Signed)
[facility] Progress Note  Patient: Amy Rubio BHA:[id number]   PCP: Redmond School, MD DOB: 1951-02-25   DOA: 03/19/2019   DOS: 03/22/2019   Date of Service: the patient was seen and examined on 03/22/2019  Brief hospital course: Pt. with PMH of COPD; admitted on 03/19/2019, presented with complaint of shortness of breath, was found to have COPD exacerbation secondary to acute bronchitis, COVID negative. Currently further plan is continue current care.  Subjective: Continues to have generalized body ache.  Continues to report shortness of breath.  No chest pain abdominal pain.  No fever no chills.   Assessment and Plan: 1. Acute COPD exacerbation. Acute hypoxic respiratory failure. Continue with IV steroids, continue antibiotic.  Continue duo nebs. Continue Mucinex. COVID negative. Oxygen requirement improving. Patient is able to ambulate without any oxygen. We will transition to oral steroids and monitor.  Likely discharge tomorrow if remains stable.  Hypertension Cont lisinopril / hydrochlorothiazide Check cmp in am  Nicotine dep Nicotine patch 7mg  topically qday Pt counselled on smoking cessation x 3 minutes  Anxiety/ depression Cont Prozac 40mg  po qday  Chronic pain Cont Lyrica 200mg  po bid Cont Percocet 5/325mg  po 4 times daily as needed Patient is requesting to increase her pain medication although with her worsening respiratory status and for generalized body ache further increase in the pain medication is not indicated. Patient is already on Lyrica as well as 3 antidepressant medication and therefore would not be able to add any Cymbalta or amitriptyline.  Gerd, hx of esophageal stricture Cont PPI  Body mass index is 18.63 kg/m.  Nutrition Problem: Increased nutrient needs Etiology: wound healing, chronic illness Interventions:    Pressure Injury 03/20/19 Sacrum Right;Posterior Stage I -  Intact skin with non-blanchable redness of a localized area  usually over a bony prominence. (Active)  03/20/19 0005  Location: Sacrum  Location Orientation: Right;Posterior  Staging: Stage I -  Intact skin with non-blanchable redness of a localized area usually over a bony prominence.  Wound Description (Comments):   Present on Admission: Yes     Diet: Regular diet DVT Prophylaxis: Subcutaneous Heparin   Advance goals of care discussion: Full code  Family Communication: no family was present at bedside, at the time of interview.  Disposition:  Discharge to Home .  Consultants: none Procedures: none  Scheduled Meds: . albuterol  2.5 mg Nebulization Q6H  . cephALEXin  500 mg Oral Q12H  . enoxaparin (LOVENOX) injection  40 mg Subcutaneous Q24H  . feeding supplement (ENSURE ENLIVE)  237 mL Oral BID BM  . FLUoxetine  40 mg Oral Daily  . fluticasone furoate-vilanterol  1 puff Inhalation Daily   And  . umeclidinium bromide  1 puff Inhalation Daily  . guaiFENesin  600 mg Oral BID  . lisinopril  10 mg Oral Daily   And  . hydrochlorothiazide  12.5 mg Oral Daily  . multivitamin with minerals  1 tablet Oral Daily  . pantoprazole  40 mg Oral Daily  . [START ON 03/23/2019] predniSONE  50 mg Oral Q breakfast  . pregabalin  200 mg Oral BID  . sodium chloride flush  3 mL Intravenous Q12H  . traZODone  50 mg Oral QHS   Continuous Infusions: . sodium chloride     PRN Meds: sodium chloride, acetaminophen **OR** acetaminophen, albuterol, methocarbamol, oxyCODONE-acetaminophen, sodium chloride flush Antibiotics: Anti-infectives (From admission, onward)   Start     Dose/Rate Route Frequency Ordered Stop   03/22/19 1115  cephALEXin (KEFLEX) capsule  500 mg     500 mg Oral Every 12 hours 03/22/19 1112 03/23/19 2159   03/21/19 1200  cefTRIAXone (ROCEPHIN) 1 g in sodium chloride 0.9 % 100 mL IVPB  Status:  Discontinued     1 g 200 mL/hr over 30 Minutes Intravenous Every 24 hours 03/21/19 1140 03/22/19 1112   03/19/19 2300  azithromycin (ZITHROMAX) 500  mg in sodium chloride 0.9 % 250 mL IVPB  Status:  Discontinued     500 mg 250 mL/hr over 60 Minutes Intravenous Every 24 hours 03/19/19 2252 03/22/19 1112       Objective: Physical Exam: Vitals:   03/22/19 0728 03/22/19 0809 03/22/19 1251 03/22/19 1505  BP:   133/62   Pulse:   61   Resp:   18   Temp:   98.5 F (36.9 C)   TempSrc:   Oral   SpO2: 99% 90% 93% 90%  Weight:      Height:        Intake/Output Summary (Last 24 hours) at 03/22/2019 1830 Last data filed at 03/22/2019 1700 Gross per 24 hour  Intake 720 ml  Output -  Net 720 ml   Filed Weights   03/20/19 0500 03/21/19 0515 03/22/19 0500  Weight: 45.6 kg 44.9 kg 46.2 kg   General: alert and oriented to time, place, and person. Appear in marked distress, affect appropriate Eyes: PERRL, Conjunctiva normal ENT: Oral Mucosa Clear, moist  Neck: no JVD, no Abnormal Mass Or lumps Cardiovascular: S1 and S2 Present, no Murmur, peripheral pulses symmetrical Respiratory: Increased respiratory effort, Bilateral Air entry equal and Decreased, no use of accessory muscle, bilateral crackles, bilateral expiratory wheezes Abdomen: Bowel Sound present, Soft and no tenderness, no hernia Skin: no rashes  Extremities: no Pedal edema, no calf tenderness Neurologic: normal without focal findings, mental status, speech normal, alert and oriented x3, PERLA, Motor strength 5/5 and symmetric and sensation grossly normal to light touch Gait not checked due to patient safety concerns  Data Reviewed: CBC: Recent Labs  Lab 03/19/19 2022 03/20/19 0423 03/21/19 0356 03/22/19 0525  WBC 4.4 2.6* 7.8 9.1  HGB 13.5 12.8 11.6* 11.1*  HCT 41.5 40.8 36.4 35.2*  MCV 102.5* 103.8* 102.8* 104.5*  PLT 223 214 225 427   Basic Metabolic Panel: Recent Labs  Lab 03/19/19 2022 03/20/19 0423 03/21/19 0356 03/22/19 0525  NA 137 136 137 136  K 3.6 4.3 4.1 3.8  CL 99 101 102 100  CO2 27 26 26 28   GLUCOSE 111* 130* 119* 153*  BUN 9 11 23 22    CREATININE 0.54 0.49 0.49 0.47  CALCIUM 9.0 8.9 8.9 8.7*    Liver Function Tests: Recent Labs  Lab 03/20/19 0423  AST 18  ALT 17  ALKPHOS 45  BILITOT 0.6  PROT 6.2*  ALBUMIN 3.7   No results for input(s): LIPASE, AMYLASE in the last 168 hours. No results for input(s): AMMONIA in the last 168 hours. Coagulation Profile: No results for input(s): INR, PROTIME in the last 168 hours. Cardiac Enzymes: Recent Labs  Lab 03/21/19 1200  CKTOTAL 68   BNP (last 3 results) No results for input(s): PROBNP in the last 8760 hours. CBG: No results for input(s): GLUCAP in the last 168 hours. Studies: No results found.   Time spent: 35 minutes  Author: Berle Mull, MD [facility] 03/22/2019 6:30 PM  To reach On-call, see care teams to locate the attending and reach out to them via [email or web address]. If 7PM-7AM, please contact night-coverage If  you still have difficulty reaching the attending provider, please page the [facility] (Director on Call) for [facility] on amion for assistance.

## 2019-03-22 NOTE — Progress Notes (Signed)
Pt ambulated in hallway without oxygen; O2 sats remain between 88-92%; pt denies any shortness of breath; pt ambulated 150 ft.

## 2019-03-23 LAB — BASIC METABOLIC PANEL
Anion gap: 8 (ref 5–15)
BUN: 20 mg/dL (ref 8–23)
CO2: 28 mmol/L (ref 22–32)
Calcium: 8.5 mg/dL — ABNORMAL LOW (ref 8.9–10.3)
Chloride: 96 mmol/L — ABNORMAL LOW (ref 98–111)
Creatinine, Ser: 0.43 mg/dL — ABNORMAL LOW (ref 0.44–1.00)
GFR calc Af Amer: >60 mL/min (ref >60)
GFR calc non Af Amer: >60 mL/min (ref >60)
Glucose, Bld: 94 mg/dL (ref 70–99)
Potassium: 3.3 mmol/L — ABNORMAL LOW (ref 3.5–5.1)
Sodium: 132 mmol/L — ABNORMAL LOW (ref 135–145)

## 2019-03-23 LAB — CBC
HCT: 36.5 % (ref 36.0–46.0)
Hemoglobin: 11.5 g/dL — ABNORMAL LOW (ref 12.0–15.0)
MCH: 32.5 pg (ref 26.0–34.0)
MCHC: 31.5 g/dL (ref 30.0–36.0)
MCV: 103.1 fL — ABNORMAL HIGH (ref 80.0–100.0)
Platelets: 198 10*3/uL (ref 150–400)
RBC: 3.54 MIL/uL — ABNORMAL LOW (ref 3.87–5.11)
RDW: 15.8 % — ABNORMAL HIGH (ref 11.5–15.5)
WBC: 4.9 10*3/uL (ref 4.0–10.5)
nRBC: 0 % (ref 0.0–0.2)

## 2019-03-23 LAB — GLUCOSE, CAPILLARY: Glucose-Capillary: 97 mg/dL (ref 70–99)

## 2019-03-23 MED ORDER — POTASSIUM CHLORIDE CRYS ER 20 MEQ PO TBCR
40.0000 meq | EXTENDED_RELEASE_TABLET | Freq: Once | ORAL | Status: AC
  Administered 2019-03-23: 09:00:00 40 meq via ORAL
  Filled 2019-03-23: qty 2

## 2019-03-23 MED ORDER — IPRATROPIUM-ALBUTEROL 0.5-2.5 (3) MG/3ML IN SOLN: 3.0000 mL | Freq: Three times a day (TID) | RESPIRATORY_TRACT | 0 refills | Status: DC

## 2019-03-23 MED ORDER — ENSURE ENLIVE PO LIQD: 237.0000 mL | Freq: Two times a day (BID) | ORAL | 12 refills | Status: AC

## 2019-03-23 MED ORDER — CEPHALEXIN 500 MG PO CAPS: 500.0000 mg | ORAL_CAPSULE | Freq: Two times a day (BID) | ORAL | 0 refills | Status: AC

## 2019-03-23 MED ORDER — PREDNISONE 10 MG PO TABS: ORAL_TABLET | ORAL | 0 refills | Status: DC

## 2019-03-23 NOTE — Progress Notes (Signed)
IV removed, 2x2 gauze and paper tape applied to site, patient tolerated well. Reviewed AVS with patient who verbalized understanding.  Patient taken to short stay lobby via wheelchair and transported home by her brother.

## 2019-03-24 ENCOUNTER — Other Ambulatory Visit: Payer: Self-pay

## 2019-03-24 DIAGNOSIS — J441 Chronic obstructive pulmonary disease with (acute) exacerbation: Secondary | ICD-10-CM

## 2019-03-25 NOTE — Discharge Summary (Signed)
[facility] Discharge Summary   Patient: Amy Rubio NWG:[id number]   PCP: Redmond School, MD DOB: 10/14/1950   Date of admission: 03/19/2019   Date of discharge: 03/23/2019     Discharge Diagnoses:   Principal Problem:   COPD exacerbation ([place]) Active Problems:   CIGARETTE SMOKER   Essential hypertension, benign   COPD (chronic obstructive pulmonary disease) ([place])   Pressure injury of skin   Admitted From: home Disposition:  Home   Recommendations for Outpatient Follow-up:  1. Please follow-up with PCP in 1 week.  Follow-up Information    Redmond School, MD. Schedule an appointment as soon as possible for a visit in 1 week(s).   Specialty: Internal Medicine Contact information: [address] [place] [address] [phone]          Diet recommendation: Cardiac diet  Activity: The patient is advised to gradually reintroduce usual activities,as tolerated .  Discharge Condition: good  Code Status: Full code  History of present illness: As per the H and P dictated on admission, "Amy Rubio  is a 68 y.o. female, w Hypertension, Anxiety/ Depression, Osteoporosis, Gerd, Smoker, Copd, apparently c/o dry cough, dyspnea and wheezing starting earlier today.  Pt denies fever, chills, cp, palp, n/v, abd pain, diarrhea, brbpr.  "  Hospital Course:  Summary of her active problems in the hospital is as following. 1. Acute COPD exacerbation. Acute hypoxic respiratory failure. Treated with IV steroids, continue antibiotic.  Continue duo nebs. Continue Mucinex. COVID negative. Oxygen requirement improving. Patient is able to ambulate without any oxygen. We will transition to oral steroids and monitor.   Hypertension Cont lisinopril / hydrochlorothiazide Check cmp in am  Nicotine dep Nicotine patch 7mg  topically qday Pt counselled on smoking cessation x 3 minutes  Anxiety/ depression Cont Prozac 40mg  po qday  Chronic pain Cont Lyrica 200mg  po bid  Cont Percocet 5/325mg  po 4 times daily as needed Patient is requesting to increase her pain medication although with her worsening respiratory status and for generalized body ache further increase in the pain medication is not indicated. Patient is already on Lyrica as well as 3 antidepressant medication and therefore would not be able to add any Cymbalta or amitriptyline.  Gerd, hx of esophageal stricture Cont PPI  Body mass index is 20.08 kg/m.  Nutrition Problem: Increased nutrient needs Etiology: wound healing, chronic illness Nutrition Interventions:  Sacral pressure ulcer, POA stage I  Pressure Injury 03/20/19 Sacrum Right;Posterior Stage I -  Intact skin with non-blanchable redness of a localized area usually over a bony prominence. (Active)  03/20/19 0005  Location: Sacrum  Location Orientation: Right;Posterior  Staging: Stage I -  Intact skin with non-blanchable redness of a localized area usually over a bony prominence.  Wound Description (Comments):   Present on Admission: Yes   Patient was ambulatory without any assistance. On the day of the discharge the patient's vitals were stable , and no other acute medical condition were reported by patient. the patient was felt safe to be discharge at Home with family.  Consultants: none Procedures: noen  DISCHARGE MEDICATION: Allergies as of 03/23/2019   No Known Allergies     Medication List    TAKE these medications   albuterol 108 (90 Base) MCG/ACT inhaler Commonly known as: Ventolin HFA INHALE 2 PUFFS INTO THE LUNGS EVERY 4 HOURS AS NEEDED FOR WHEEZING ORSHORTNESS OF BREATH. What changed:   how much to take  how to take this  when to take this  reasons to  take this   albuterol (2.5 MG/3ML) 0.083% nebulizer solution Commonly known as: PROVENTIL Take 3 mLs (2.5 mg total) by nebulization every 4 (four) hours as needed for wheezing or shortness of breath. What changed: Another medication with the same name  was changed. Make sure you understand how and when to take each.   Belsomra 10 MG Tabs Generic drug: Suvorexant   cephALEXin 500 MG capsule Commonly known as: KEFLEX Take 1 capsule (500 mg total) by mouth 2 (two) times daily for 3 days.   feeding supplement (ENSURE ENLIVE) Liqd Take 237 mLs by mouth 2 (two) times daily between meals.   FLUoxetine 40 MG capsule Commonly known as: PROZAC Take 40 mg by mouth daily.   guaiFENesin 600 MG 12 hr tablet Commonly known as: MUCINEX Take 1 tablet (600 mg total) by mouth 2 (two) times daily.   ipratropium-albuterol 0.5-2.5 (3) MG/3ML Soln Commonly known as: DUONEB Take 3 mLs by nebulization 3 (three) times daily for 4 days.   lisinopril-hydrochlorothiazide 10-12.5 MG tablet Commonly known as: ZESTORETIC Take 1 tablet by mouth daily.   omeprazole 20 MG capsule Commonly known as: PRILOSEC Take 1 capsule (20 mg total) by mouth daily.   oxyCODONE-acetaminophen 5-325 MG tablet Commonly known as: PERCOCET/ROXICET Take 1 tablet by mouth every 6 (six) hours as needed for severe pain.   predniSONE 10 MG tablet Commonly known as: DELTASONE Take 50mg  daily for 3days,Take 40mg  daily for 3days,Take 30mg  daily for 3days,Take 20mg  daily for 3days,Take 10mg  daily for 3days, then stop   pregabalin 150 MG capsule Commonly known as: LYRICA Take 200 mg by mouth 2 (two) times daily.   traZODone 50 MG tablet Commonly known as: DESYREL Take 1 tablet (50 mg total) by mouth at bedtime.   Trelegy Ellipta 100-62.5-25 MCG/INH Aepb Generic drug: Fluticasone-Umeclidin-Vilant Take 1 puff by mouth daily as needed (breathing).      No Known Allergies Discharge Instructions    Diet - low sodium heart healthy   Complete by: As directed    Increase activity slowly   Complete by: As directed      Discharge Exam: Filed Weights   03/21/19 0515 03/22/19 0500 03/23/19 0707  Weight: 44.9 kg 46.2 kg 49.8 kg   Vitals:   03/23/19 0711 03/23/19 1128  BP:  (!) 145/58 (!) 141/60  Pulse: (!) 58 64  Resp: 15 18  Temp:  97.9 F (36.6 C)  SpO2: 93% 94%   General: Appear in no distress, no Rash; Oral Mucosa Clear, moist. no Abnormal Mass Or lumps Cardiovascular: S1 and S2 Present, no Murmur, Respiratory: normal respiratory effort, Bilateral Air entry present and no Crackles, bilateral Occasional  wheezes Abdomen: Bowel Sound present, Soft and no tenderness, no hernia Extremities: no Pedal edema, o calf tenderness Neurology: alert and oriented to time, place, and person affect appropriate. normal without focal findings, mental status, speech normal, alert and oriented x3, PERLA, Motor strength 5/5 and symmetric and sensation grossly normal to light touch   The results of significant diagnostics from this hospitalization (including imaging, microbiology, ancillary and laboratory) are listed below for reference.    Significant Diagnostic Studies: Dg Chest Port 1 View  Result Date: 03/21/2019 CLINICAL DATA:  Pt c/o worsening SOB today. Low O2 sats. HX HTN, GERD, COPD, smoker EXAM: PORTABLE CHEST - 1 VIEW COMPARISON:  03/19/2019 FINDINGS: Lungs are clear.  Prominent right nipple shadow as before. Heart size and mediastinal contours are within normal limits. Aortic Atherosclerosis (ICD10-170.0). No effusion.  No pneumothorax.  Visualized bones unremarkable. IMPRESSION: No acute cardiopulmonary disease. Electronically Signed   By: Lucrezia Europe M.D.   On: 03/21/2019 14:05   Dg Chest Portable 1 View  Result Date: 03/19/2019 CLINICAL DATA:  Shortness of breath EXAM: PORTABLE CHEST 1 VIEW COMPARISON:  12/18/2018 FINDINGS: There is hyperinflation of the lungs compatible with COPD. Heart and mediastinal contours are within normal limits. No focal opacities or effusions. No acute bony abnormality. IMPRESSION: COPD.  No active disease. Electronically Signed   By: Rolm Baptise M.D.   On: 03/19/2019 21:22    Microbiology: Recent Results (from the past 240 hour(s))   SARS Coronavirus 2 (CEPHEID- Performed in [place] hospital lab), Hosp Order     Status: None   Collection Time: 03/19/19  8:48 PM   Specimen: Nasopharyngeal Swab  Result Value Ref Range Status   SARS Coronavirus 2 NEGATIVE NEGATIVE Final    Comment: (NOTE) If result is NEGATIVE SARS-CoV-2 target nucleic acids are NOT DETECTED. The SARS-CoV-2 RNA is generally detectable in upper and lower  respiratory specimens during the acute phase of infection. The lowest  concentration of SARS-CoV-2 viral copies this assay can detect is 250  copies / mL. A negative result does not preclude SARS-CoV-2 infection  and should not be used as the sole basis for treatment or other  patient management decisions.  A negative result may occur with  improper specimen collection / handling, submission of specimen other  than nasopharyngeal swab, presence of viral mutation(s) within the  areas targeted by this assay, and inadequate number of viral copies  (<250 copies / mL). A negative result must be combined with clinical  observations, patient history, and epidemiological information. If result is POSITIVE SARS-CoV-2 target nucleic acids are DETECTED. The SARS-CoV-2 RNA is generally detectable in upper and lower  respiratory specimens dur ing the acute phase of infection.  Positive  results are indicative of active infection with SARS-CoV-2.  Clinical  correlation with patient history and other diagnostic information is  necessary to determine patient infection status.  Positive results do  not rule out bacterial infection or co-infection with other viruses. If result is PRESUMPTIVE POSTIVE SARS-CoV-2 nucleic acids MAY BE PRESENT.   A presumptive positive result was obtained on the submitted specimen  and confirmed on repeat testing.  While 2019 novel coronavirus  (SARS-CoV-2) nucleic acids may be present in the submitted sample  additional confirmatory testing may be necessary for epidemiological  and  / or clinical management purposes  to differentiate between  SARS-CoV-2 and other Sarbecovirus currently known to infect humans.  If clinically indicated additional testing with an alternate test  methodology [phone]) is advised. The SARS-CoV-2 RNA is generally  detectable in upper and lower respiratory sp ecimens during the acute  phase of infection. The expected result is Negative. Fact Sheet for Patients:  StrictlyIdeas.no Fact Sheet for Healthcare Providers: BankingDealers.co.za This test is not yet approved or cleared by the [place] FDA and has been authorized for detection and/or diagnosis of SARS-CoV-2 by FDA under an Emergency Use Authorization (EUA).  This EUA will remain in effect (meaning this test can be used) for the duration of the COVID-19 declaration under Section 564(b)(1) of the Act, 21 U.S.C. section 360bbb-3(b)(1), unless the authorization is terminated or revoked sooner. Performed at [facility], [address], [place], [place] [address]      Labs: CBC: Recent Labs  Lab 03/19/19 2022 03/20/19 0423 03/21/19 0356 03/22/19 0525 03/23/19 0715  WBC 4.4 2.6* 7.8  9.1 4.9  HGB 13.5 12.8 11.6* 11.1* 11.5*  HCT 41.5 40.8 36.4 35.2* 36.5  MCV 102.5* 103.8* 102.8* 104.5* 103.1*  PLT 223 214 225 198 403   Basic Metabolic Panel: Recent Labs  Lab 03/19/19 2022 03/20/19 0423 03/21/19 0356 03/22/19 0525 03/23/19 0715  NA 137 136 137 136 132*  K 3.6 4.3 4.1 3.8 3.3*  CL 99 101 102 100 96*  CO2 27 26 26 28 28   GLUCOSE 111* 130* 119* 153* 94  BUN 9 11 23 22 20   CREATININE 0.54 0.49 0.49 0.47 0.43*  CALCIUM 9.0 8.9 8.9 8.7* 8.5*   Liver Function Tests: Recent Labs  Lab 03/20/19 0423  AST 18  ALT 17  ALKPHOS 45  BILITOT 0.6  PROT 6.2*  ALBUMIN 3.7   No results for input(s): LIPASE, AMYLASE in the last 168 hours. No results for input(s): AMMONIA in the last 168 hours. Cardiac Enzymes: Recent Labs   Lab 03/21/19 1200  CKTOTAL 68   BNP (last 3 results) Recent Labs    12/18/18 0010  BNP 61.0   CBG: Recent Labs  Lab 03/23/19 0708  GLUCAP 97   Time spent: 35 minutes  Signed:  Berle Mull  [facility] 03/23/2019

## 2019-03-26 ENCOUNTER — Other Ambulatory Visit: Payer: Self-pay

## 2019-03-26 NOTE — Patient Outreach (Signed)
[place] [facility]) Care Management  03/26/2019  SHAQUNA GEIGLE [birth year] [id number]     Referral received from [place] for complex case management.  Initial outreach unsuccessful. Left HIPAA compliant voice message requesting a return call.   PLAN -Will follow-up within 3-4 business days.   [place] [phone]

## 2019-03-31 DIAGNOSIS — J441 Chronic obstructive pulmonary disease with (acute) exacerbation: Secondary | ICD-10-CM | POA: Diagnosis not present

## 2019-03-31 DIAGNOSIS — I10 Essential (primary) hypertension: Secondary | ICD-10-CM | POA: Diagnosis not present

## 2019-03-31 DIAGNOSIS — G4700 Insomnia, unspecified: Secondary | ICD-10-CM | POA: Diagnosis not present

## 2019-03-31 DIAGNOSIS — Z1389 Encounter for screening for other disorder: Secondary | ICD-10-CM | POA: Diagnosis not present

## 2019-03-31 DIAGNOSIS — Z681 Body mass index (BMI) 19 or less, adult: Secondary | ICD-10-CM | POA: Diagnosis not present

## 2019-03-31 DIAGNOSIS — J9690 Respiratory failure, unspecified, unspecified whether with hypoxia or hypercapnia: Secondary | ICD-10-CM | POA: Diagnosis not present

## 2019-04-01 ENCOUNTER — Other Ambulatory Visit: Payer: Self-pay

## 2019-04-01 NOTE — Patient Outreach (Signed)
[place] [facility]) Care Management  04/01/2019  Amy Rubio 03-16-1951 [id number]    Unsuccessful outreach attempt. Left voice message requesting a return call.   PLAN -Will attempt outreach within 3-4 business days.   [place] [phone]

## 2019-04-02 DIAGNOSIS — M1990 Unspecified osteoarthritis, unspecified site: Secondary | ICD-10-CM | POA: Diagnosis not present

## 2019-04-02 DIAGNOSIS — Z79899 Other long term (current) drug therapy: Secondary | ICD-10-CM | POA: Diagnosis not present

## 2019-04-02 DIAGNOSIS — G894 Chronic pain syndrome: Secondary | ICD-10-CM | POA: Diagnosis not present

## 2019-04-02 DIAGNOSIS — E559 Vitamin D deficiency, unspecified: Secondary | ICD-10-CM | POA: Diagnosis not present

## 2019-04-07 ENCOUNTER — Other Ambulatory Visit: Payer: Self-pay

## 2019-04-07 NOTE — Patient Outreach (Signed)
[place] [facility]) Care Management  04/07/2019  Amy Rubio Oct 28, 1950 [id number]   3rd unsuccessful outreach attempt. No response to previous voice messages or outreach letter.     PLAN  -Case closure pending.   [place] [phone]

## 2019-05-01 DIAGNOSIS — Z760 Encounter for issue of repeat prescription: Secondary | ICD-10-CM | POA: Diagnosis not present

## 2019-05-01 DIAGNOSIS — M1990 Unspecified osteoarthritis, unspecified site: Secondary | ICD-10-CM | POA: Diagnosis not present

## 2019-05-01 DIAGNOSIS — Z79899 Other long term (current) drug therapy: Secondary | ICD-10-CM | POA: Diagnosis not present

## 2019-05-01 DIAGNOSIS — G894 Chronic pain syndrome: Secondary | ICD-10-CM | POA: Diagnosis not present

## 2019-05-01 DIAGNOSIS — G14 Postpolio syndrome: Secondary | ICD-10-CM | POA: Diagnosis not present

## 2019-05-13 ENCOUNTER — Other Ambulatory Visit (HOSPITAL_COMMUNITY): Payer: Self-pay | Admitting: Internal Medicine

## 2019-05-13 DIAGNOSIS — Z1231 Encounter for screening mammogram for malignant neoplasm of breast: Secondary | ICD-10-CM

## 2019-05-21 DIAGNOSIS — J449 Chronic obstructive pulmonary disease, unspecified: Secondary | ICD-10-CM | POA: Diagnosis not present

## 2019-05-21 DIAGNOSIS — J441 Chronic obstructive pulmonary disease with (acute) exacerbation: Secondary | ICD-10-CM | POA: Diagnosis not present

## 2019-05-21 DIAGNOSIS — Z681 Body mass index (BMI) 19 or less, adult: Secondary | ICD-10-CM | POA: Diagnosis not present

## 2019-06-06 DIAGNOSIS — Z79899 Other long term (current) drug therapy: Secondary | ICD-10-CM | POA: Diagnosis not present

## 2019-06-06 DIAGNOSIS — G894 Chronic pain syndrome: Secondary | ICD-10-CM | POA: Diagnosis not present

## 2019-06-06 DIAGNOSIS — Z760 Encounter for issue of repeat prescription: Secondary | ICD-10-CM | POA: Diagnosis not present

## 2019-06-06 DIAGNOSIS — M1990 Unspecified osteoarthritis, unspecified site: Secondary | ICD-10-CM | POA: Diagnosis not present

## 2019-06-23 DIAGNOSIS — M1991 Primary osteoarthritis, unspecified site: Secondary | ICD-10-CM | POA: Diagnosis not present

## 2019-06-23 DIAGNOSIS — M810 Age-related osteoporosis without current pathological fracture: Secondary | ICD-10-CM | POA: Diagnosis not present

## 2019-06-23 DIAGNOSIS — Z681 Body mass index (BMI) 19 or less, adult: Secondary | ICD-10-CM | POA: Diagnosis not present

## 2019-06-23 DIAGNOSIS — J449 Chronic obstructive pulmonary disease, unspecified: Secondary | ICD-10-CM | POA: Diagnosis not present

## 2019-06-23 DIAGNOSIS — J418 Mixed simple and mucopurulent chronic bronchitis: Secondary | ICD-10-CM | POA: Diagnosis not present

## 2019-07-01 ENCOUNTER — Other Ambulatory Visit: Payer: Self-pay

## 2019-07-01 ENCOUNTER — Encounter (HOSPITAL_BASED_OUTPATIENT_CLINIC_OR_DEPARTMENT_OTHER): Payer: Self-pay | Admitting: *Deleted

## 2019-07-03 DIAGNOSIS — G894 Chronic pain syndrome: Secondary | ICD-10-CM | POA: Diagnosis not present

## 2019-07-03 DIAGNOSIS — M1990 Unspecified osteoarthritis, unspecified site: Secondary | ICD-10-CM | POA: Diagnosis not present

## 2019-07-03 DIAGNOSIS — Z23 Encounter for immunization: Secondary | ICD-10-CM | POA: Diagnosis not present

## 2019-07-03 DIAGNOSIS — Z79899 Other long term (current) drug therapy: Secondary | ICD-10-CM | POA: Diagnosis not present

## 2019-07-04 ENCOUNTER — Encounter (HOSPITAL_BASED_OUTPATIENT_CLINIC_OR_DEPARTMENT_OTHER)
Admission: RE | Admit: 2019-07-04 | Discharge: 2019-07-04 | Disposition: A | Discharge status: Home / Self Care | Payer: Medicare Other | Source: Ambulatory Visit | Attending: Otolaryngology | Admitting: Otolaryngology

## 2019-07-04 ENCOUNTER — Other Ambulatory Visit: Payer: Self-pay

## 2019-07-04 ENCOUNTER — Encounter (HOSPITAL_BASED_OUTPATIENT_CLINIC_OR_DEPARTMENT_OTHER): Payer: Medicare Other

## 2019-07-04 ENCOUNTER — Other Ambulatory Visit (HOSPITAL_COMMUNITY)
Admission: RE | Admit: 2019-07-04 | Discharge: 2019-07-04 | Disposition: A | Discharge status: Home / Self Care | Payer: Medicare Other | Source: Ambulatory Visit | Attending: Otolaryngology | Admitting: Otolaryngology

## 2019-07-04 DIAGNOSIS — F17210 Nicotine dependence, cigarettes, uncomplicated: Secondary | ICD-10-CM | POA: Diagnosis not present

## 2019-07-04 DIAGNOSIS — J449 Chronic obstructive pulmonary disease, unspecified: Secondary | ICD-10-CM | POA: Diagnosis not present

## 2019-07-04 DIAGNOSIS — Z01812 Encounter for preprocedural laboratory examination: Secondary | ICD-10-CM | POA: Diagnosis not present

## 2019-07-04 DIAGNOSIS — Z20828 Contact with and (suspected) exposure to other viral communicable diseases: Secondary | ICD-10-CM | POA: Insufficient documentation

## 2019-07-04 DIAGNOSIS — M797 Fibromyalgia: Secondary | ICD-10-CM | POA: Diagnosis not present

## 2019-07-04 DIAGNOSIS — R490 Dysphonia: Secondary | ICD-10-CM | POA: Diagnosis not present

## 2019-07-04 DIAGNOSIS — J382 Nodules of vocal cords: Secondary | ICD-10-CM | POA: Insufficient documentation

## 2019-07-04 DIAGNOSIS — Z79899 Other long term (current) drug therapy: Secondary | ICD-10-CM | POA: Diagnosis not present

## 2019-07-04 DIAGNOSIS — I739 Peripheral vascular disease, unspecified: Secondary | ICD-10-CM | POA: Diagnosis not present

## 2019-07-04 DIAGNOSIS — D141 Benign neoplasm of larynx: Secondary | ICD-10-CM | POA: Diagnosis not present

## 2019-07-04 DIAGNOSIS — I10 Essential (primary) hypertension: Secondary | ICD-10-CM | POA: Diagnosis not present

## 2019-07-04 DIAGNOSIS — J381 Polyp of vocal cord and larynx: Secondary | ICD-10-CM | POA: Diagnosis present

## 2019-07-04 DIAGNOSIS — K219 Gastro-esophageal reflux disease without esophagitis: Secondary | ICD-10-CM | POA: Diagnosis not present

## 2019-07-04 LAB — BASIC METABOLIC PANEL
Anion gap: 10 (ref 5–15)
BUN: 11 mg/dL (ref 8–23)
CO2: 27 mmol/L (ref 22–32)
Calcium: 9 mg/dL (ref 8.9–10.3)
Chloride: 100 mmol/L (ref 98–111)
Creatinine, Ser: 0.57 mg/dL (ref 0.44–1.00)
GFR calc Af Amer: >60 mL/min (ref >60)
GFR calc non Af Amer: >60 mL/min (ref >60)
Glucose, Bld: 88 mg/dL (ref 70–99)
Potassium: 4 mmol/L (ref 3.5–5.1)
Sodium: 137 mmol/L (ref 135–145)

## 2019-07-04 NOTE — Progress Notes (Signed)
Pt here for BMP, SO2 check and anesthesia consult. SO2 94% on room air with HR 70. Dr Deatra Canter evaluated patient and determined she is not a candidate for [facility] due to 2 COPD exacerbations this year and no pulmonology testing to review. Dr Ola Spurr and I both discussed at length with patient that this change will ensure a safer surgery for her. Will send pt for COVID testing at [facility] while she is in town and she knows to quarantine. Will notify Dr Deeann Saint office of need to reschedule at main hospital with hopes of getting her in ASAP.

## 2019-07-04 NOTE — Progress Notes (Signed)
Called Dr Deeann Saint office to notify them of need to cancel case at [facility] and move case to Main hospital. Spoke with Nira Conn who will make needed change. She is aware pt has had blood drawn and was sent for COVID test now.

## 2019-07-06 LAB — NOVEL CORONAVIRUS, NAA (HOSP ORDER, SEND-OUT TO REF LAB; TAT 18-24 HRS): SARS-CoV-2, NAA: NOT DETECTED

## 2019-07-07 ENCOUNTER — Encounter (HOSPITAL_COMMUNITY): Payer: Self-pay | Admitting: *Deleted

## 2019-07-08 ENCOUNTER — Ambulatory Visit (HOSPITAL_COMMUNITY): Payer: Medicare Other | Admitting: Anesthesiology

## 2019-07-08 ENCOUNTER — Ambulatory Visit (HOSPITAL_COMMUNITY)
Admission: RE | Admit: 2019-07-08 | Discharge: 2019-07-08 | Disposition: A | Discharge status: Home / Self Care | Payer: Medicare Other | Attending: Otolaryngology | Admitting: Otolaryngology

## 2019-07-08 ENCOUNTER — Other Ambulatory Visit: Payer: Self-pay

## 2019-07-08 ENCOUNTER — Encounter (HOSPITAL_COMMUNITY)
Admission: RE | Disposition: A | Discharge status: Home / Self Care | Payer: Self-pay | Source: Home / Self Care | Attending: Otolaryngology

## 2019-07-08 ENCOUNTER — Encounter (HOSPITAL_COMMUNITY): Payer: Self-pay | Admitting: Certified Registered Nurse Anesthetist

## 2019-07-08 DIAGNOSIS — F17210 Nicotine dependence, cigarettes, uncomplicated: Secondary | ICD-10-CM | POA: Insufficient documentation

## 2019-07-08 DIAGNOSIS — Z79899 Other long term (current) drug therapy: Secondary | ICD-10-CM | POA: Insufficient documentation

## 2019-07-08 DIAGNOSIS — I10 Essential (primary) hypertension: Secondary | ICD-10-CM | POA: Insufficient documentation

## 2019-07-08 DIAGNOSIS — K219 Gastro-esophageal reflux disease without esophagitis: Secondary | ICD-10-CM | POA: Diagnosis not present

## 2019-07-08 DIAGNOSIS — J449 Chronic obstructive pulmonary disease, unspecified: Secondary | ICD-10-CM | POA: Diagnosis not present

## 2019-07-08 DIAGNOSIS — R490 Dysphonia: Secondary | ICD-10-CM | POA: Insufficient documentation

## 2019-07-08 DIAGNOSIS — D141 Benign neoplasm of larynx: Secondary | ICD-10-CM | POA: Diagnosis not present

## 2019-07-08 DIAGNOSIS — J383 Other diseases of vocal cords: Secondary | ICD-10-CM | POA: Diagnosis not present

## 2019-07-08 DIAGNOSIS — M797 Fibromyalgia: Secondary | ICD-10-CM | POA: Diagnosis not present

## 2019-07-08 DIAGNOSIS — E785 Hyperlipidemia, unspecified: Secondary | ICD-10-CM | POA: Diagnosis not present

## 2019-07-08 DIAGNOSIS — J441 Chronic obstructive pulmonary disease with (acute) exacerbation: Secondary | ICD-10-CM | POA: Diagnosis not present

## 2019-07-08 DIAGNOSIS — I739 Peripheral vascular disease, unspecified: Secondary | ICD-10-CM | POA: Diagnosis not present

## 2019-07-08 HISTORY — PX: MICROLARYNGOSCOPY: SHX5208

## 2019-07-08 LAB — CBC
HCT: 39 % (ref 36.0–46.0)
Hemoglobin: 12.5 g/dL (ref 12.0–15.0)
MCH: 32.6 pg (ref 26.0–34.0)
MCHC: 32.1 g/dL (ref 30.0–36.0)
MCV: 101.8 fL — ABNORMAL HIGH (ref 80.0–100.0)
Platelets: 231 10*3/uL (ref 150–400)
RBC: 3.83 MIL/uL — ABNORMAL LOW (ref 3.87–5.11)
RDW: 13.9 % (ref 11.5–15.5)
WBC: 5.3 10*3/uL (ref 4.0–10.5)
nRBC: 0 % (ref 0.0–0.2)

## 2019-07-08 SURGERY — MICROLARYNGOSCOPY
Anesthesia: General | Site: Throat

## 2019-07-08 MED ORDER — DEXAMETHASONE SODIUM PHOSPHATE 10 MG/ML IJ SOLN
INTRAMUSCULAR | Status: AC
  Filled 2019-07-08: qty 1

## 2019-07-08 MED ORDER — MIDAZOLAM HCL 2 MG/2ML IJ SOLN
INTRAMUSCULAR | Status: AC
  Filled 2019-07-08: qty 2

## 2019-07-08 MED ORDER — CEFAZOLIN SODIUM-DEXTROSE 2-3 GM-%(50ML) IV SOLR
INTRAVENOUS | Status: DC | PRN
  Administered 2019-07-08: 2 g via INTRAVENOUS

## 2019-07-08 MED ORDER — EPINEPHRINE HCL (NASAL) 0.1 % NA SOLN
NASAL | Status: DC | PRN
  Administered 2019-07-08: 30 mL via TOPICAL

## 2019-07-08 MED ORDER — SUCCINYLCHOLINE CHLORIDE 200 MG/10ML IV SOSY
PREFILLED_SYRINGE | INTRAVENOUS | Status: DC | PRN
  Administered 2019-07-08: 80 mg via INTRAVENOUS

## 2019-07-08 MED ORDER — LACTATED RINGERS IV SOLN
INTRAVENOUS | Status: DC
  Administered 2019-07-08: 08:00:00 via INTRAVENOUS

## 2019-07-08 MED ORDER — ROCURONIUM BROMIDE 10 MG/ML (PF) SYRINGE
PREFILLED_SYRINGE | INTRAVENOUS | Status: AC
  Filled 2019-07-08: qty 10

## 2019-07-08 MED ORDER — EPINEPHRINE HCL (NASAL) 0.1 % NA SOLN
NASAL | Status: AC
  Filled 2019-07-08: qty 30

## 2019-07-08 MED ORDER — DEXAMETHASONE SODIUM PHOSPHATE 10 MG/ML IJ SOLN
INTRAMUSCULAR | Status: DC | PRN
  Administered 2019-07-08: 10 mg via INTRAVENOUS

## 2019-07-08 MED ORDER — FENTANYL CITRATE (PF) 100 MCG/2ML IJ SOLN: 25.0000 ug | INTRAMUSCULAR | Status: DC | PRN

## 2019-07-08 MED ORDER — FENTANYL CITRATE (PF) 250 MCG/5ML IJ SOLN
INTRAMUSCULAR | Status: AC
  Filled 2019-07-08: qty 5

## 2019-07-08 MED ORDER — PROPOFOL 10 MG/ML IV BOLUS
INTRAVENOUS | Status: DC | PRN
  Administered 2019-07-08: 80 mg via INTRAVENOUS
  Administered 2019-07-08: 40 mg via INTRAVENOUS

## 2019-07-08 MED ORDER — PROPOFOL 10 MG/ML IV BOLUS
INTRAVENOUS | Status: AC
  Filled 2019-07-08: qty 40

## 2019-07-08 MED ORDER — LIDOCAINE 2% (20 MG/ML) 5 ML SYRINGE
INTRAMUSCULAR | Status: DC | PRN
  Administered 2019-07-08: 50 mg via INTRAVENOUS

## 2019-07-08 MED ORDER — SUCCINYLCHOLINE CHLORIDE 200 MG/10ML IV SOSY
PREFILLED_SYRINGE | INTRAVENOUS | Status: AC
  Filled 2019-07-08: qty 10

## 2019-07-08 MED ORDER — ONDANSETRON HCL 4 MG/2ML IJ SOLN
INTRAMUSCULAR | Status: DC | PRN
  Administered 2019-07-08: 4 mg via INTRAVENOUS

## 2019-07-08 MED ORDER — FENTANYL CITRATE (PF) 100 MCG/2ML IJ SOLN
INTRAMUSCULAR | Status: DC | PRN
  Administered 2019-07-08 (×2): 25 ug via INTRAVENOUS
  Administered 2019-07-08: 50 ug via INTRAVENOUS

## 2019-07-08 MED ORDER — LIDOCAINE 2% (20 MG/ML) 5 ML SYRINGE
INTRAMUSCULAR | Status: AC
  Filled 2019-07-08: qty 5

## 2019-07-08 MED ORDER — ONDANSETRON HCL 4 MG/2ML IJ SOLN
INTRAMUSCULAR | Status: AC
  Filled 2019-07-08: qty 2

## 2019-07-08 MED ORDER — LACTATED RINGERS IV SOLN: INTRAVENOUS | Status: DC

## 2019-07-08 MED ORDER — PROMETHAZINE HCL 25 MG/ML IJ SOLN: 6.2500 mg | INTRAMUSCULAR | Status: DC | PRN

## 2019-07-08 MED ORDER — CEFAZOLIN SODIUM 1 G IJ SOLR
INTRAMUSCULAR | Status: AC
  Filled 2019-07-08: qty 20

## 2019-07-08 SURGICAL SUPPLY — 25 items
BLADE SURG 15 STRL LF DISP TIS (BLADE) IMPLANT
BLADE SURG 15 STRL SS (BLADE)
CANISTER SUCT 3000ML PPV (MISCELLANEOUS) ×3 IMPLANT
CONT SPEC 4OZ CLIKSEAL STRL BL (MISCELLANEOUS) ×4 IMPLANT
COVER BACK TABLE 60X90IN (DRAPES) ×3 IMPLANT
COVER WAND RF STERILE (DRAPES) ×3 IMPLANT
DRAPE HALF SHEET 40X57 (DRAPES) ×3 IMPLANT
GAUZE SPONGE 4X4 12PLY STRL (GAUZE/BANDAGES/DRESSINGS) ×3 IMPLANT
GLOVE BIO SURGEON STRL SZ7.5 (GLOVE) ×3 IMPLANT
GOWN STRL REUS W/ TWL LRG LVL3 (GOWN DISPOSABLE) ×2 IMPLANT
GOWN STRL REUS W/TWL LRG LVL3 (GOWN DISPOSABLE) ×4
KIT TURNOVER KIT B (KITS) ×3 IMPLANT
MARKER SKIN DUAL TIP RULER LAB (MISCELLANEOUS) IMPLANT
NDL HYPO 25GX1X1/2 BEV (NEEDLE) IMPLANT
NEEDLE HYPO 25GX1X1/2 BEV (NEEDLE) IMPLANT
NS IRRIG 1000ML POUR BTL (IV SOLUTION) ×3 IMPLANT
PAD ARMBOARD 7.5X6 YLW CONV (MISCELLANEOUS) ×6 IMPLANT
PATTIES SURGICAL .5 X1 (DISPOSABLE) IMPLANT
SOL ANTI FOG 6CC (MISCELLANEOUS) IMPLANT
SOLUTION ANTI FOG 6CC (MISCELLANEOUS) ×2
SURGILUBE 2OZ TUBE FLIPTOP (MISCELLANEOUS) ×3 IMPLANT
TOWEL GREEN STERILE FF (TOWEL DISPOSABLE) ×6 IMPLANT
TUBE CONNECTING 12'X1/4 (SUCTIONS) ×1
TUBE CONNECTING 12X1/4 (SUCTIONS) ×2 IMPLANT
WATER STERILE IRR 1000ML POUR (IV SOLUTION) ×3 IMPLANT

## 2019-07-08 NOTE — Op Note (Signed)
DATE OF PROCEDURE:  07/08/2019                              OPERATIVE REPORT  SURGEON:  Leta Baptist, MD  PREOPERATIVE DIAGNOSES: 1. Chronic hoarseness 2. Right vocal cord polyp  POSTOPERATIVE DIAGNOSES: 1.  Chronic hoarseness 2. Bilateral vocal cord polyps  PROCEDURE PERFORMED: MicroDirect laryngoscopy with excision of vocal cord polyps  ANESTHESIA:  General endotracheal tube anesthesia.  COMPLICATIONS:  None.  ESTIMATED BLOOD LOSS:  Minimal.  INDICATION FOR PROCEDURE:  BISCEGLIA PYEATT is a 68 y.o. female with a history of chronic hoarseness.  On examination, the patient was noted to have a large 1 cm right vocal cord polyp.  It resulted in a glottic gap and chronic hoarseness. Based on the above findings, the decision was made for the patient to undergo the above-stated procedure. Likelihood of success in reducing symptoms was also discussed.  The risks, benefits, alternatives, and details of the procedure were discussed with the patient.  Questions were invited and answered.  Informed consent was obtained.  DESCRIPTION:  The patient was taken to the operating room and placed supine on the operating table.  General endotracheal tube anesthesia was administered by the anesthesiologist.  The patient was positioned and prepped and draped in a standard fashion for direct laryngoscopy.  A Dedo laryngoscope was used for examination.  The scope was advanced past the oral cavity into the pharynx.  Examination of the epiglottis, aryepiglottic folds, vallecula, piriform sinuses were all normal.  Examination of the glottis showed a large 1 cm right vocal cord polyp.  The Dedo laryngoscope was then suspended with a Lewy suspender.  An operating microscope was brought into the field.  Under the operating microscope, the right vocal cord polyp was excised using a pair of laryngeal scissors.  At this time, the patient was noted to have a smaller left vocal cord polyp.  The left vocal cord polyp was also  removed.  Hemostasis was achieved with pledgets soaked with epinephrine.  The care of the patient was turned over to the anesthesiologist.  The patient was awakened from anesthesia without difficulty.  The patient was extubated and transferred to the recovery room in good condition.  OPERATIVE FINDINGS: Bilateral vocal cord polyps.  SPECIMEN: Bilateral vocal cord polyps.  FOLLOWUP CARE:  The patient will be discharged home once awake and alert. The patient will follow up in my office in approximately 1 week.  Zackariah Vanderpol W Santiago Stenzel 07/08/2019 8:53 AM

## 2019-07-08 NOTE — Anesthesia Preprocedure Evaluation (Signed)
Anesthesia Evaluation  Patient identified by MRN, date of birth, ID band Patient awake    Reviewed: Allergy & Precautions, NPO status , Patient's Chart, lab work & pertinent test results  Airway Mallampati: II  TM Distance: >3 FB Neck ROM: Full    Dental  (+) Dental Advisory Given   Pulmonary shortness of breath, COPD,  COPD inhaler, Current Smoker,    breath sounds clear to auscultation       Cardiovascular hypertension, Pt. on medications + Peripheral Vascular Disease   Rhythm:Regular Rate:Normal     Neuro/Psych  Headaches,  Neuromuscular disease    GI/Hepatic Neg liver ROS, GERD  ,  Endo/Other  negative endocrine ROS  Renal/GU negative Renal ROS     Musculoskeletal  (+) Arthritis , Fibromyalgia -  Abdominal   Peds  Hematology negative hematology ROS (+)   Anesthesia Other Findings   Reproductive/Obstetrics                             Anesthesia Physical Anesthesia Plan  ASA: III  Anesthesia Plan: General   Post-op Pain Management:    Induction: Intravenous  PONV Risk Score and Plan: 2 and Dexamethasone, Ondansetron and Treatment may vary due to age or medical condition  Airway Management Planned: Oral ETT  Additional Equipment:   Intra-op Plan:   Post-operative Plan: Extubation in OR  Informed Consent: I have reviewed the patients History and Physical, chart, labs and discussed the procedure including the risks, benefits and alternatives for the proposed anesthesia with the patient or authorized representative who has indicated his/her understanding and acceptance.     Dental advisory given  Plan Discussed with: CRNA  Anesthesia Plan Comments:         Anesthesia Quick Evaluation

## 2019-07-08 NOTE — Anesthesia Procedure Notes (Signed)
Procedure Name: Intubation Date/Time: 07/08/2019 8:33 AM Performed by: Genelle Bal, CRNA Pre-anesthesia Checklist: Patient identified, Emergency Drugs available, Suction available and Patient being monitored Patient Re-evaluated:Patient Re-evaluated prior to induction Oxygen Delivery Method: Circle system utilized Preoxygenation: Pre-oxygenation with 100% oxygen Induction Type: IV induction Ventilation: Mask ventilation without difficulty Laryngoscope Size: Miller and 2 Grade View: Grade I Tube type: Oral Tube size: 6.0 mm Number of attempts: 1 Airway Equipment and Method: Stylet and Oral airway Placement Confirmation: ETT inserted through vocal cords under direct vision,  positive ETCO2 and breath sounds checked- equal and bilateral Secured at: 20 cm Tube secured with: Tape Dental Injury: Teeth and Oropharynx as per pre-operative assessment

## 2019-07-08 NOTE — Anesthesia Postprocedure Evaluation (Signed)
Anesthesia Post Note  Patient: Amy Rubio  Procedure(s) Performed: MICRO DIRECT LARYNGOSOCPY WITH EXCISION VOCAL CORD MASS (N/A Throat)     Patient location during evaluation: PACU Anesthesia Type: General Level of consciousness: awake and alert Pain management: pain level controlled Vital Signs Assessment: post-procedure vital signs reviewed and stable Respiratory status: spontaneous breathing, nonlabored ventilation, respiratory function stable and patient connected to nasal cannula oxygen Cardiovascular status: blood pressure returned to baseline and stable Postop Assessment: no apparent nausea or vomiting Anesthetic complications: no    Last Vitals:  Vitals:   07/08/19 0919 07/08/19 0934  BP: (!) 160/70 (!) 164/68  Pulse: 77 70  Resp: 12 19  Temp:    SpO2: (!) 85% 98%    Last Pain:  Vitals:   07/08/19 0709  TempSrc:   PainSc: 0-No pain                 Tiajuana Amass

## 2019-07-08 NOTE — Discharge Instructions (Addendum)
The patient may resume all her previous activities and diet.  She will follow-up in my Amy Rubio office in 1 week.

## 2019-07-08 NOTE — Transfer of Care (Signed)
Immediate Anesthesia Transfer of Care Note  Patient: Amy Rubio  Procedure(s) Performed: MICRO DIRECT LARYNGOSOCPY WITH EXCISION VOCAL CORD MASS (N/A Throat)  Patient Location: PACU  Anesthesia Type:General  Level of Consciousness: awake, alert  and oriented  Airway & Oxygen Therapy: Patient Spontanous Breathing and Patient connected to face mask oxygen  Post-op Assessment: Report given to RN and Post -op Vital signs reviewed and stable  Post vital signs: Reviewed and stable  Last Vitals:  Vitals Value Taken Time  BP 119/87   Temp    Pulse 87 07/08/19 0906  Resp 11 07/08/19 0906  SpO2 97 % 07/08/19 0906  Vitals shown include unvalidated device data.  Last Pain:  Vitals:   07/08/19 0709  TempSrc:   PainSc: 0-No pain         Complications: No apparent anesthesia complications
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
## 2019-07-09 ENCOUNTER — Encounter (HOSPITAL_COMMUNITY): Payer: Self-pay | Admitting: Otolaryngology

## 2019-07-09 LAB — SURGICAL PATHOLOGY

## 2019-07-13 LAB — ANAEROBIC CULTURE
Gram Stain: NONE SEEN
Special Requests: 2

## 2019-07-14 ENCOUNTER — Other Ambulatory Visit: Payer: Self-pay

## 2019-07-14 ENCOUNTER — Ambulatory Visit (INDEPENDENT_AMBULATORY_CARE_PROVIDER_SITE_OTHER): Payer: Medicare Other | Admitting: Otolaryngology

## 2019-07-14 DIAGNOSIS — R490 Dysphonia: Secondary | ICD-10-CM

## 2019-07-14 DIAGNOSIS — J381 Polyp of vocal cord and larynx: Secondary | ICD-10-CM

## 2019-07-14 LAB — ANAEROBIC CULTURE

## 2019-07-23 DIAGNOSIS — J441 Chronic obstructive pulmonary disease with (acute) exacerbation: Secondary | ICD-10-CM | POA: Diagnosis not present

## 2019-07-23 DIAGNOSIS — J439 Emphysema, unspecified: Secondary | ICD-10-CM | POA: Diagnosis not present

## 2019-07-23 DIAGNOSIS — Z681 Body mass index (BMI) 19 or less, adult: Secondary | ICD-10-CM | POA: Diagnosis not present

## 2019-07-23 DIAGNOSIS — J22 Unspecified acute lower respiratory infection: Secondary | ICD-10-CM | POA: Diagnosis not present

## 2019-07-23 DIAGNOSIS — J449 Chronic obstructive pulmonary disease, unspecified: Secondary | ICD-10-CM | POA: Diagnosis not present

## 2019-08-04 DIAGNOSIS — Z79899 Other long term (current) drug therapy: Secondary | ICD-10-CM | POA: Diagnosis not present

## 2019-08-04 DIAGNOSIS — M1990 Unspecified osteoarthritis, unspecified site: Secondary | ICD-10-CM | POA: Diagnosis not present

## 2019-08-04 DIAGNOSIS — G894 Chronic pain syndrome: Secondary | ICD-10-CM | POA: Diagnosis not present

## 2019-08-24 ENCOUNTER — Other Ambulatory Visit: Payer: Self-pay

## 2019-08-24 ENCOUNTER — Encounter (HOSPITAL_COMMUNITY): Payer: Self-pay | Admitting: Emergency Medicine

## 2019-08-24 ENCOUNTER — Emergency Department (HOSPITAL_COMMUNITY)
Admission: EM | Admit: 2019-08-24 | Discharge: 2019-08-25 | Disposition: A | Discharge status: Home / Self Care | Payer: Medicare Other | Attending: Emergency Medicine | Admitting: Emergency Medicine

## 2019-08-24 DIAGNOSIS — J449 Chronic obstructive pulmonary disease, unspecified: Secondary | ICD-10-CM | POA: Diagnosis not present

## 2019-08-24 DIAGNOSIS — T887XXA Unspecified adverse effect of drug or medicament, initial encounter: Secondary | ICD-10-CM | POA: Insufficient documentation

## 2019-08-24 DIAGNOSIS — G9340 Encephalopathy, unspecified: Secondary | ICD-10-CM

## 2019-08-24 DIAGNOSIS — Y829 Unspecified medical devices associated with adverse incidents: Secondary | ICD-10-CM | POA: Diagnosis not present

## 2019-08-24 DIAGNOSIS — F17210 Nicotine dependence, cigarettes, uncomplicated: Secondary | ICD-10-CM | POA: Diagnosis not present

## 2019-08-24 DIAGNOSIS — Z79899 Other long term (current) drug therapy: Secondary | ICD-10-CM | POA: Insufficient documentation

## 2019-08-24 DIAGNOSIS — R918 Other nonspecific abnormal finding of lung field: Secondary | ICD-10-CM | POA: Diagnosis not present

## 2019-08-24 DIAGNOSIS — Z20828 Contact with and (suspected) exposure to other viral communicable diseases: Secondary | ICD-10-CM | POA: Insufficient documentation

## 2019-08-24 DIAGNOSIS — T50905A Adverse effect of unspecified drugs, medicaments and biological substances, initial encounter: Secondary | ICD-10-CM

## 2019-08-24 DIAGNOSIS — I10 Essential (primary) hypertension: Secondary | ICD-10-CM | POA: Diagnosis not present

## 2019-08-24 DIAGNOSIS — T426X5A Adverse effect of other antiepileptic and sedative-hypnotic drugs, initial encounter: Secondary | ICD-10-CM | POA: Insufficient documentation

## 2019-08-24 DIAGNOSIS — R4182 Altered mental status, unspecified: Secondary | ICD-10-CM | POA: Diagnosis present

## 2019-08-24 NOTE — ED Triage Notes (Signed)
Pt's daughter states she took "too much of her lyrica and oxycodone." Pt's daughter states there was no pills left in her bottle that was filled 11/19 with 60 in the bottle.

## 2019-08-25 ENCOUNTER — Emergency Department (HOSPITAL_COMMUNITY): Payer: Medicare Other

## 2019-08-25 ENCOUNTER — Encounter (HOSPITAL_COMMUNITY): Payer: Self-pay | Admitting: Radiology

## 2019-08-25 DIAGNOSIS — G9340 Encephalopathy, unspecified: Secondary | ICD-10-CM | POA: Diagnosis not present

## 2019-08-25 DIAGNOSIS — R918 Other nonspecific abnormal finding of lung field: Secondary | ICD-10-CM | POA: Diagnosis not present

## 2019-08-25 LAB — CBC WITH DIFFERENTIAL/PLATELET
Abs Immature Granulocytes: 0.02 10*3/uL (ref 0.00–0.07)
Basophils Absolute: 0 10*3/uL (ref 0.0–0.1)
Basophils Relative: 1 %
Eosinophils Absolute: 0 10*3/uL (ref 0.0–0.5)
Eosinophils Relative: 1 %
HCT: 37.2 % (ref 36.0–46.0)
Hemoglobin: 12 g/dL (ref 12.0–15.0)
Immature Granulocytes: 1 %
Lymphocytes Relative: 30 %
Lymphs Abs: 1.3 10*3/uL (ref 0.7–4.0)
MCH: 32.5 pg (ref 26.0–34.0)
MCHC: 32.3 g/dL (ref 30.0–36.0)
MCV: 100.8 fL — ABNORMAL HIGH (ref 80.0–100.0)
Monocytes Absolute: 0.5 10*3/uL (ref 0.1–1.0)
Monocytes Relative: 11 %
Neutro Abs: 2.5 10*3/uL (ref 1.7–7.7)
Neutrophils Relative %: 56 %
Platelets: 309 10*3/uL (ref 150–400)
RBC: 3.69 MIL/uL — ABNORMAL LOW (ref 3.87–5.11)
RDW: 15.1 % (ref 11.5–15.5)
WBC: 4.2 10*3/uL (ref 4.0–10.5)
nRBC: 0 % (ref 0.0–0.2)

## 2019-08-25 LAB — URINALYSIS, ROUTINE W REFLEX MICROSCOPIC
Bilirubin Urine: NEGATIVE
Glucose, UA: NEGATIVE mg/dL
Hgb urine dipstick: NEGATIVE
Ketones, ur: NEGATIVE mg/dL
Leukocytes,Ua: NEGATIVE
Nitrite: NEGATIVE
Protein, ur: NEGATIVE mg/dL
Specific Gravity, Urine: 1.003 — ABNORMAL LOW (ref 1.005–1.030)
pH: 7 (ref 5.0–8.0)

## 2019-08-25 LAB — COMPREHENSIVE METABOLIC PANEL
ALT: 20 U/L (ref 0–44)
AST: 20 U/L (ref 15–41)
Albumin: 3.9 g/dL (ref 3.5–5.0)
Alkaline Phosphatase: 48 U/L (ref 38–126)
Anion gap: 9 (ref 5–15)
BUN: 10 mg/dL (ref 8–23)
CO2: 28 mmol/L (ref 22–32)
Calcium: 8.8 mg/dL — ABNORMAL LOW (ref 8.9–10.3)
Chloride: 99 mmol/L (ref 98–111)
Creatinine, Ser: 0.44 mg/dL (ref 0.44–1.00)
GFR calc Af Amer: >60 mL/min (ref >60)
GFR calc non Af Amer: >60 mL/min (ref >60)
Glucose, Bld: 100 mg/dL — ABNORMAL HIGH (ref 70–99)
Potassium: 3.1 mmol/L — ABNORMAL LOW (ref 3.5–5.1)
Sodium: 136 mmol/L (ref 135–145)
Total Bilirubin: 0.5 mg/dL (ref 0.3–1.2)
Total Protein: 6.4 g/dL — ABNORMAL LOW (ref 6.5–8.1)

## 2019-08-25 LAB — RAPID URINE DRUG SCREEN, HOSP PERFORMED
Amphetamines: NOT DETECTED
Barbiturates: NOT DETECTED
Benzodiazepines: NOT DETECTED
Cocaine: NOT DETECTED
Opiates: POSITIVE — AB
Tetrahydrocannabinol: NOT DETECTED

## 2019-08-25 LAB — BLOOD GAS, ARTERIAL
Acid-Base Excess: 5.9 mmol/L — ABNORMAL HIGH (ref 0.0–2.0)
Bicarbonate: 29.2 mmol/L — ABNORMAL HIGH (ref 20.0–28.0)
FIO2: 21
O2 Saturation: 95.3 %
Patient temperature: 37
pCO2 arterial: 48.7 mmHg — ABNORMAL HIGH (ref 32.0–48.0)
pH, Arterial: 7.412 (ref 7.350–7.450)
pO2, Arterial: 73.7 mmHg — ABNORMAL LOW (ref 83.0–108.0)

## 2019-08-25 LAB — ACETAMINOPHEN LEVEL: Acetaminophen (Tylenol), Serum: <10 ug/mL — ABNORMAL LOW (ref 10–30)

## 2019-08-25 LAB — LACTIC ACID, PLASMA: Lactic Acid, Venous: 0.7 mmol/L (ref 0.5–1.9)

## 2019-08-25 LAB — ETHANOL: Alcohol, Ethyl (B): <10 mg/dL (ref <10)

## 2019-08-25 LAB — AMMONIA: Ammonia: 19 umol/L (ref 9–35)

## 2019-08-25 LAB — SALICYLATE LEVEL: Salicylate Lvl: <7 mg/dL (ref 2.8–30.0)

## 2019-08-25 LAB — SARS CORONAVIRUS 2 (TAT 6-24 HRS): SARS Coronavirus 2: NEGATIVE

## 2019-08-25 NOTE — ED Provider Notes (Signed)
[facility] EMERGENCY DEPARTMENT Provider Note   CSN: [id number] Arrival date & time: 08/24/19  2345     History   Chief Complaint Chief Complaint  Patient presents with   Drug Overdose    HPI Amy Rubio is a 68 y.o. female.     Patient presents to the emergency department for evaluation of possible overdose.  She is accompanied by her daughter.  Daughter reports that she and the patient went shopping today.  After they got home, daughter noticed that the patient seemed confused.  The had only been home 10 or 15 minutes.  Daughter is concerned that she might of taken too much of her Lyrica.  She also takes oxycodone, which her daughter reports that she has been on for 40 years.  In addition, patient has not been sleeping well lately, several days ago, primary care doctor called in a prescription for Ambien.  At arrival, patient is disoriented and confused, able to follow commands but unreliably answers questions. Level V Caveat due to mental status change.     Past Medical History:  Diagnosis Date   Adenomatous colon polyp 2007   Due surveillance colonoscopy 2012   Anxiety    Arthritis    Chronic pain    COPD (chronic obstructive pulmonary disease) (HCC)    exacerbations requiring hospitalizations x 2 at [place]   Depression    Dyspnea    Esophageal stricture 07/2009   Fibromyalgia    GERD (gastroesophageal reflux disease)    Headache(784.0)    Heart murmur    Hypertension    Osteoporosis    Polio    Right lower extremity weakness   Post poliomyelitis syndrome     Patient Active Problem List   Diagnosis Date Noted   Pressure injury of skin 03/20/2019   COPD (chronic obstructive pulmonary disease) ([place]) 12/19/2018   COPD with acute exacerbation ([place]) 12/18/2018   CAP (community acquired pneumonia) 12/18/2018   Acute respiratory failure with hypoxia ([place]) 12/18/2018   Constipation 01/31/2018   S/P ORIF (open reduction internal  fixation) fracture( cannulated hip pins ) 07/15/17 07/30/2017   Hypokalemia 07/15/2017   Hyponatremia 07/15/2017   Closed left hip fracture, initial encounter ([place]) 07/15/2017   Anxiety 07/15/2017   Visual field defect 12/13/2015   Senile purpura ([place]) 06/14/2015   Hyperlipidemia 10/30/2013   Osteoporosis 07/31/2013   Essential hypertension, benign 07/31/2013   Osteoarthritis 07/31/2013   Chronic pain syndrome 04/24/2013   Esophageal dysphagia 10/30/2011   FH: colon cancer 10/30/2011   Abnormal CT scan, esophagus 10/30/2011   ESOPHAGEAL STRICTURE 08/24/2009   COLONIC POLYPS, ADENOMATOUS, HX OF 08/24/2009   CIGARETTE SMOKER 08/18/2009   COPD exacerbation ([place]) 08/18/2009   WEIGHT LOSS, RECENT 08/18/2009   CHEST PAIN UNSPECIFIED 08/18/2009    Past Surgical History:  Procedure Laterality Date   ABDOMINAL HYSTERECTOMY     PARTIAL   CATARACT EXTRACTION W/PHACO Left 01/26/2015   Procedure: CATARACT EXTRACTION PHACO AND INTRAOCULAR LENS PLACEMENT ([place]);  Surgeon: Rutherford Guys, MD;  Location: [facility];  Service: Ophthalmology;  Laterality: Left;  CDE:7.72   CATARACT EXTRACTION W/PHACO Right 02/23/2015   Procedure: CATARACT EXTRACTION PHACO AND INTRAOCULAR LENS PLACEMENT (I[facility]);  Surgeon: Rutherford Guys, MD;  Location: [facility];  Service: Ophthalmology;  Laterality: Right;  CDE:10.68   COLONOSCOPY  2007   adenoma (1.3cm). multiple small polyps ablated, diverticulosis   COLONOSCOPY WITH PROPOFOL N/A 03/25/2018   Procedure: COLONOSCOPY WITH PROPOFOL;  Surgeon: Daneil Dolin, MD;  Location:  [facility];  Service: Endoscopy;  Laterality: N/A;  9:15am   ESOPHAGOGASTRODUODENOSCOPY  07/23/09   distal thickened GEJ, peptic stricture narrowed lumen to 15mm, small hh, moderate gastritis (no H.Pylori), mild duodenitis.    ESOPHAGOGASTRODUODENOSCOPY  2013   Dr. Gala Romney: Possible occult cervical esophageal web status post dilation, small hiatal hernia.   HIP PINNING,CANNULATED  Left 07/15/2017   Procedure: CANNULATED HIP PINNING;  Surgeon: Carole Civil, MD;  Location: [facility];  Service: Orthopedics;  Laterality: Left;   MICROLARYNGOSCOPY N/A 07/08/2019   Procedure: MICRO DIRECT LARYNGOSOCPY WITH EXCISION VOCAL CORD MASS;  Surgeon: Leta Baptist, MD;  Location: [place];  Service: ENT;  Laterality: N/A;   PARTIAL HYSTERECTOMY     POLYPECTOMY  03/25/2018   Procedure: POLYPECTOMY;  Surgeon: Daneil Dolin, MD;  Location: [facility];  Service: Endoscopy;;  sigmoid, ascending, cecal     OB History   No obstetric history on file.      Home Medications    Prior to Admission medications   Medication Sig Start Date End Date Taking? Authorizing Provider  albuterol (PROVENTIL) (2.5 MG/3ML) 0.083% nebulizer solution Take 3 mLs (2.5 mg total) by nebulization every 4 (four) hours as needed for wheezing or shortness of breath. 12/19/18   [name], MD  albuterol (VENTOLIN HFA) 108 (90 Base) MCG/ACT inhaler INHALE 2 PUFFS INTO THE LUNGS EVERY 4 HOURS AS NEEDED FOR WHEEZING ORSHORTNESS OF BREATH. Patient taking differently: Inhale 2 puffs into the lungs every 4 (four) hours as needed for wheezing or shortness of breath. INHALE 2 PUFFS INTO THE LUNGS EVERY 4 HOURS AS NEEDED FOR WHEEZING ORSHORTNESS OF BREATH. 02/14/17   Kathyrn Drown, MD  BELSOMRA 10 MG TABS  02/26/19   [provider]  feeding supplement, ENSURE ENLIVE, (ENSURE ENLIVE) LIQD Take 237 mLs by mouth 2 (two) times daily between meals. 03/23/19   Lavina Hamman, MD  FLUoxetine (PROZAC) 40 MG capsule Take 40 mg by mouth daily.    [provider]  guaiFENesin (MUCINEX) 600 MG 12 hr tablet Take 1 tablet (600 mg total) by mouth 2 (two) times daily. 12/19/18   [name], MD  lisinopril-hydrochlorothiazide (PRINZIDE,ZESTORETIC) 10-12.5 MG tablet Take 1 tablet by mouth daily. 01/02/18   [provider]  omeprazole (PRILOSEC) 20 MG capsule Take 1 capsule (20 mg total) by mouth daily.  08/18/16 08/18/20  Kathyrn Drown, MD  oxyCODONE-acetaminophen (PERCOCET/ROXICET) 5-325 MG tablet Take 1 tablet by mouth every 6 (six) hours as needed for severe pain.    [provider]  pregabalin (LYRICA) 150 MG capsule Take 200 mg by mouth 2 (two) times daily.     [provider]  traZODone (DESYREL) 50 MG tablet Take 1 tablet (50 mg total) by mouth at bedtime. 02/14/17   Luking, Elayne Snare, MD  TRELEGY ELLIPTA 100-62.5-25 MCG/INH AEPB Take 1 puff by mouth daily as needed (breathing).  01/29/18   [provider]    Family History Family History  Problem Relation Age of Onset   Colon cancer Father        58s   Hypertension Father    Lung cancer Sister        stage IV    Social History Social History   Tobacco Use   Smoking status: Current Every Day Smoker    Packs/day: 1.00    Years: 45.00    Pack years: 45.00    Types: Cigarettes   Smokeless tobacco: Never Used  Substance Use  Topics   Alcohol use: No   Drug use: No     Allergies   Patient has no known allergies.   Review of Systems Review of Systems  Unable to perform ROS: Mental status change     Physical Exam Updated Vital Signs BP 138/87    Pulse 86    Temp 99.1 F (37.3 C)    Resp 19    Ht 5\' 6"  (1.676 m)    Wt 44.4 kg    SpO2 94%    BMI 15.80 kg/m   Physical Exam Vitals signs and nursing note reviewed.  Constitutional:      General: She is not in acute distress.    Appearance: Normal appearance. She is well-developed.  HENT:     Head: Normocephalic and atraumatic.     Right Ear: Hearing normal.     Left Ear: Hearing normal.     Nose: Nose normal.  Eyes:     Conjunctiva/sclera: Conjunctivae normal.     Pupils: Pupils are equal, round, and reactive to light.  Neck:     Musculoskeletal: Normal range of motion and neck supple.  Cardiovascular:     Rate and Rhythm: Regular rhythm.     Heart sounds: S1 normal and S2 normal. No murmur. No friction rub. No gallop.     Pulmonary:     Effort: Pulmonary effort is normal. No respiratory distress.     Breath sounds: Normal breath sounds.  Chest:     Chest wall: No tenderness.  Abdominal:     General: Bowel sounds are normal.     Palpations: Abdomen is soft.     Tenderness: There is no abdominal tenderness. There is no guarding or rebound. Negative signs include Murphy's sign and McBurney's sign.     Hernia: No hernia is present.  Musculoskeletal: Normal range of motion.  Skin:    General: Skin is warm and dry.     Findings: No rash.  Neurological:     Mental Status: She is lethargic, disoriented and confused.     GCS: GCS eye subscore is 4. GCS verbal subscore is 5. GCS motor subscore is 6.     Cranial Nerves: No cranial nerve deficit.     Sensory: No sensory deficit.     Coordination: Coordination normal.  Psychiatric:        Speech: Speech is slurred.        Behavior: Behavior is slowed and withdrawn.      ED Treatments / Results  Labs (all labs ordered are listed, but only abnormal results are displayed) Labs Reviewed  CBC WITH DIFFERENTIAL/PLATELET - Abnormal; Notable for the following components:      Result Value   RBC 3.69 (*)    MCV 100.8 (*)    All other components within normal limits  COMPREHENSIVE METABOLIC PANEL - Abnormal; Notable for the following components:   Potassium 3.1 (*)    Glucose, Bld 100 (*)    Calcium 8.8 (*)    Total Protein 6.4 (*)    All other components within normal limits  URINALYSIS, ROUTINE W REFLEX MICROSCOPIC - Abnormal; Notable for the following components:   Color, Urine STRAW (*)    Specific Gravity, Urine 1.003 (*)    All other components within normal limits  RAPID URINE DRUG SCREEN, HOSP PERFORMED - Abnormal; Notable for the following components:   Opiates POSITIVE (*)    All other components within normal limits  ACETAMINOPHEN LEVEL - Abnormal; Notable for the  following components:   Acetaminophen (Tylenol), Serum <10 (*)    All other  components within normal limits  BLOOD GAS, ARTERIAL - Abnormal; Notable for the following components:   pCO2 arterial 48.7 (*)    pO2, Arterial 73.7 (*)    Bicarbonate 29.2 (*)    Acid-Base Excess 5.9 (*)    Allens test (pass/fail) NOT INDICATED (*)    All other components within normal limits  ETHANOL  AMMONIA  SALICYLATE LEVEL  LACTIC ACID, PLASMA    EKG None  Radiology Ct Head Wo Contrast  Result Date: 08/25/2019 CLINICAL DATA:  Encephalopathy. Possible drug overdose. EXAM: CT HEAD WITHOUT CONTRAST TECHNIQUE: Contiguous axial images were obtained from the base of the skull through the vertex without intravenous contrast. COMPARISON:  None. FINDINGS: Brain: There is no mass, hemorrhage or extra-axial collection. The size and configuration of the ventricles and extra-axial CSF spaces are normal. The brain parenchyma is normal, without acute or chronic infarction. Vascular: Atherosclerotic calcification of the internal carotid arteries at the skull base. No abnormal hyperdensity of the major intracranial arteries or dural venous sinuses. Skull: The visualized skull base, calvarium and extracranial soft tissues are normal. Sinuses/Orbits: No fluid levels or advanced mucosal thickening of the visualized paranasal sinuses. No mastoid or middle ear effusion. The orbits are normal. IMPRESSION: No acute abnormality Electronically Signed   By: Ulyses Jarred M.D.   On: 08/25/2019 00:37   Dg Chest Port 1 View  Result Date: 08/25/2019 CLINICAL DATA:  Possible drug overdose EXAM: PORTABLE CHEST 1 VIEW COMPARISON:  03/21/2019 FINDINGS: The lungs are hyperinflated with diffuse interstitial prominence. No focal airspace consolidation or pulmonary edema. No pleural effusion or pneumothorax. Normal cardiomediastinal contours. IMPRESSION: No focal airspace disease or pulmonary edema. Electronically Signed   By: Ulyses Jarred M.D.   On: 08/25/2019 00:39    Procedures Procedures (including critical care  time)  Medications Ordered in ED Medications - No data to display   Initial Impression / Assessment and Plan / ED Course  I have reviewed the triage vital signs and the nursing notes.  Pertinent labs & imaging results that were available during my care of the patient were reviewed by me and considered in my medical decision making (see chart for details).        Patient presents to the emergency department with altered mental status.  Patient brought by daughter.  Daughter was concerned about possible overdose.  Daughter reports that the Lyrica is missing from the patient's supplies.  At arrival patient was very groggy and disoriented.  She fell asleep for a period of time and then woke up and is back to her normal baseline.  She states that she has been hiding her medications from her family, they are in her bathroom.  She denies overdose.  She does report that she took the Ambien tonight because she was extremely tired, has not slept for the last couple of nights.  This was likely a reaction to the Ambien.  I recommended that she not take the Ambien in the future, follow-up with primary doctor for further instructions on her insomnia.  As she is back to her baseline and work-up was entirely normal, she is appropriate for discharge.  Final Clinical Impressions(s) / ED Diagnoses   Final diagnoses:  Acute encephalopathy  Adverse effect of drug, initial encounter    ED Discharge Orders    None       Myles Tavella, Gwenyth Allegra, MD 08/25/19 [phone]

## 2019-08-25 NOTE — Discharge Instructions (Addendum)
Stop taking the Ambien and follow-up with your doctor for further instructions on how to treat your insomnia

## 2019-08-25 NOTE — ED Notes (Addendum)
Pt states that she has not taken any extra medication. Pt states that she only takes her medication as prescribed. Pt states that if all of her medication is missing "then my daughter took them not me".

## 2019-09-29 ENCOUNTER — Encounter (HOSPITAL_COMMUNITY): Payer: Self-pay | Admitting: Emergency Medicine

## 2019-09-29 ENCOUNTER — Emergency Department (HOSPITAL_COMMUNITY): Payer: Medicare Other

## 2019-09-29 ENCOUNTER — Other Ambulatory Visit: Payer: Self-pay

## 2019-09-29 ENCOUNTER — Inpatient Hospital Stay (HOSPITAL_COMMUNITY)
Admission: EM | Admit: 2019-09-29 | Discharge: 2019-10-06 | DRG: 190 | Disposition: A | Discharge status: Home / Self Care | Payer: Medicare Other | Attending: Family Medicine | Admitting: Family Medicine

## 2019-09-29 DIAGNOSIS — M810 Age-related osteoporosis without current pathological fracture: Secondary | ICD-10-CM | POA: Diagnosis present

## 2019-09-29 DIAGNOSIS — J189 Pneumonia, unspecified organism: Secondary | ICD-10-CM

## 2019-09-29 DIAGNOSIS — F17210 Nicotine dependence, cigarettes, uncomplicated: Secondary | ICD-10-CM | POA: Diagnosis present

## 2019-09-29 DIAGNOSIS — F418 Other specified anxiety disorders: Secondary | ICD-10-CM | POA: Diagnosis present

## 2019-09-29 DIAGNOSIS — Z79899 Other long term (current) drug therapy: Secondary | ICD-10-CM

## 2019-09-29 DIAGNOSIS — Z8249 Family history of ischemic heart disease and other diseases of the circulatory system: Secondary | ICD-10-CM

## 2019-09-29 DIAGNOSIS — Z20828 Contact with and (suspected) exposure to other viral communicable diseases: Secondary | ICD-10-CM | POA: Diagnosis not present

## 2019-09-29 DIAGNOSIS — R531 Weakness: Secondary | ICD-10-CM | POA: Diagnosis present

## 2019-09-29 DIAGNOSIS — E785 Hyperlipidemia, unspecified: Secondary | ICD-10-CM | POA: Diagnosis present

## 2019-09-29 DIAGNOSIS — J9601 Acute respiratory failure with hypoxia: Secondary | ICD-10-CM | POA: Diagnosis present

## 2019-09-29 DIAGNOSIS — E43 Unspecified severe protein-calorie malnutrition: Secondary | ICD-10-CM | POA: Insufficient documentation

## 2019-09-29 DIAGNOSIS — T380X5A Adverse effect of glucocorticoids and synthetic analogues, initial encounter: Secondary | ICD-10-CM | POA: Diagnosis present

## 2019-09-29 DIAGNOSIS — R0603 Acute respiratory distress: Secondary | ICD-10-CM | POA: Diagnosis not present

## 2019-09-29 DIAGNOSIS — J439 Emphysema, unspecified: Principal | ICD-10-CM | POA: Diagnosis present

## 2019-09-29 DIAGNOSIS — M797 Fibromyalgia: Secondary | ICD-10-CM | POA: Diagnosis present

## 2019-09-29 DIAGNOSIS — Z681 Body mass index (BMI) 19 or less, adult: Secondary | ICD-10-CM

## 2019-09-29 DIAGNOSIS — K219 Gastro-esophageal reflux disease without esophagitis: Secondary | ICD-10-CM | POA: Diagnosis present

## 2019-09-29 DIAGNOSIS — E876 Hypokalemia: Secondary | ICD-10-CM | POA: Diagnosis present

## 2019-09-29 DIAGNOSIS — J441 Chronic obstructive pulmonary disease with (acute) exacerbation: Secondary | ICD-10-CM

## 2019-09-29 DIAGNOSIS — Z7951 Long term (current) use of inhaled steroids: Secondary | ICD-10-CM

## 2019-09-29 DIAGNOSIS — Z8701 Personal history of pneumonia (recurrent): Secondary | ICD-10-CM

## 2019-09-29 DIAGNOSIS — M1990 Unspecified osteoarthritis, unspecified site: Secondary | ICD-10-CM | POA: Diagnosis present

## 2019-09-29 DIAGNOSIS — B91 Sequelae of poliomyelitis: Secondary | ICD-10-CM

## 2019-09-29 DIAGNOSIS — I10 Essential (primary) hypertension: Secondary | ICD-10-CM | POA: Diagnosis present

## 2019-09-29 DIAGNOSIS — Z20822 Contact with and (suspected) exposure to covid-19: Secondary | ICD-10-CM | POA: Diagnosis present

## 2019-09-29 DIAGNOSIS — R739 Hyperglycemia, unspecified: Secondary | ICD-10-CM | POA: Diagnosis present

## 2019-09-29 LAB — CBC
HCT: 39.5 % (ref 36.0–46.0)
Hemoglobin: 12.4 g/dL (ref 12.0–15.0)
MCH: 32.8 pg (ref 26.0–34.0)
MCHC: 31.4 g/dL (ref 30.0–36.0)
MCV: 104.5 fL — ABNORMAL HIGH (ref 80.0–100.0)
Platelets: 263 10*3/uL (ref 150–400)
RBC: 3.78 MIL/uL — ABNORMAL LOW (ref 3.87–5.11)
RDW: 14.5 % (ref 11.5–15.5)
WBC: 7.7 10*3/uL (ref 4.0–10.5)
nRBC: 0 % (ref 0.0–0.2)

## 2019-09-29 LAB — BASIC METABOLIC PANEL
Anion gap: 11 (ref 5–15)
BUN: 9 mg/dL (ref 8–23)
CO2: 30 mmol/L (ref 22–32)
Calcium: 8.6 mg/dL — ABNORMAL LOW (ref 8.9–10.3)
Chloride: 96 mmol/L — ABNORMAL LOW (ref 98–111)
Creatinine, Ser: 0.53 mg/dL (ref 0.44–1.00)
GFR calc Af Amer: >60 mL/min (ref >60)
GFR calc non Af Amer: >60 mL/min (ref >60)
Glucose, Bld: 107 mg/dL — ABNORMAL HIGH (ref 70–99)
Potassium: 3.3 mmol/L — ABNORMAL LOW (ref 3.5–5.1)
Sodium: 137 mmol/L (ref 135–145)

## 2019-09-29 LAB — POC SARS CORONAVIRUS 2 AG -  ED: SARS Coronavirus 2 Ag: NEGATIVE

## 2019-09-29 MED ORDER — METHYLPREDNISOLONE SODIUM SUCC 125 MG IJ SOLR
125.0000 mg | Freq: Once | INTRAMUSCULAR | Status: AC
  Administered 2019-09-29: 125 mg via INTRAVENOUS
  Filled 2019-09-29: qty 2

## 2019-09-29 MED ORDER — ALBUTEROL (5 MG/ML) CONTINUOUS INHALATION SOLN
10.0000 mg/h | INHALATION_SOLUTION | RESPIRATORY_TRACT | Status: AC
  Administered 2019-09-30: 10 mg/h via RESPIRATORY_TRACT
  Filled 2019-09-29: qty 20

## 2019-09-29 MED ORDER — MAGNESIUM SULFATE 2 GM/50ML IV SOLN
2.0000 g | INTRAVENOUS | Status: AC
  Administered 2019-09-29: 23:00:00 2 g via INTRAVENOUS
  Filled 2019-09-29: qty 50

## 2019-09-29 MED ORDER — DOXYCYCLINE HYCLATE 100 MG PO TABS
100.0000 mg | ORAL_TABLET | Freq: Once | ORAL | Status: AC
  Administered 2019-09-30: 100 mg via ORAL
  Filled 2019-09-29: qty 1

## 2019-09-29 NOTE — ED Triage Notes (Signed)
Pt c/o sob that started 4 days ago. Pt states she has copd and oxygen levels have been in 70s at home. Pt is not on home o2.

## 2019-09-29 NOTE — H&P (Signed)
History and Physical    Amy Rubio [id number] DOB: 05-22-1951 DOA: 09/29/2019  PCP: Redmond School, MD   Patient coming from: Home.  I have personally briefly reviewed patient's old medical records in [place]  Chief Complaint: Shortness of breath.  HPI: Amy Rubio is a 68 y.o. 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  She mentions that her O2 sats have been in the 70s at home.  She has been coughing frequently, mostly a dry cough, but occasionally productive of yellow sputum.  Denies hemoptysis.  She denies fever, chills, rhinorrhea or sore throat.  Positive pleuritic chest pain and palpitations from bronchodilators, but denies dizziness, diaphoresis, PND, orthopnea or pitting edema of the lower extremities.  No abdominal pain, nausea, vomiting, diarrhea, constipation, melena or hematochezia.  No dysuria, frequency or hematuria.  Denies polyuria, polydipsia, polyphagia or blurred vision.  ED Course: Initial vital signs temperature 98.2 F, pulse 84, respirations 24, blood pressure 149/72 mmHg and O2 sat 90% on room air.  She was given a tablet of doxycycline 100 mg p.o., Solu-Medrol 125 mg IVP x1 and magnesium sulfate 2 g IVPB.  CBC shows a white count of 7.7, hemoglobin 12.4 g/dL and platelets 263.  Her MCV is elevated at 104.3.  SARS antigen and PCR negative.  Influenza PCR is negative.  Sodium is 137, potassium 3.30, CO2 30 and chloride 96 mmol/L.  Glucose 107, BUN 9, creatinine 0.53 and calcium 8.6 mg/dL.  No albumin level is available to check for calcium  correction.  Chest radiograph showed hyperinflation with emphysema and small focus of airspace disease at the left base which may represent early pneumonia..  Review of Systems: As per HPI otherwise 10 point review of systems negative.   Past Medical History:  Diagnosis Date  . Adenomatous colon polyp 2007   Due surveillance colonoscopy 2012  . Anxiety   . Arthritis   . Chronic pain   . COPD (chronic obstructive pulmonary disease) (HCC)    exacerbations requiring hospitalizations x 2 at [facility]  . Depression   . Dyspnea   . Esophageal stricture 07/2009  . Fibromyalgia   . GERD (gastroesophageal reflux disease)   . Headache(784.0)   . Heart murmur   . Hypertension   . Osteoporosis   . Polio    Right lower extremity weakness  . Post poliomyelitis syndrome     Past Surgical History:  Procedure Laterality Date  . ABDOMINAL HYSTERECTOMY     PARTIAL  . CATARACT EXTRACTION W/PHACO Left 01/26/2015   Procedure: CATARACT EXTRACTION PHACO AND INTRAOCULAR LENS PLACEMENT (IOC);  Surgeon: Rutherford Guys, MD;  Location: [facility];  Service: Ophthalmology;  Laterality: Left;  CDE:7.72  . CATARACT EXTRACTION W/PHACO Right 02/23/2015   Procedure: CATARACT EXTRACTION PHACO AND INTRAOCULAR LENS PLACEMENT (IOC);  Surgeon: Rutherford Guys, MD;  Location: [facility];  Service: Ophthalmology;  Laterality: Right;  CDE:10.68  . COLONOSCOPY  2007   adenoma (1.3cm). multiple small polyps ablated, diverticulosis  . COLONOSCOPY WITH PROPOFOL N/A 03/25/2018   Procedure: COLONOSCOPY WITH PROPOFOL;  Surgeon: Daneil Dolin, MD;  Location: [facility];  Service: Endoscopy;  Laterality: N/A;  9:15am  .  ESOPHAGOGASTRODUODENOSCOPY  07/23/09   distal thickened GEJ, peptic stricture narrowed lumen to 35mm, small hh, moderate gastritis (no H.Pylori), mild duodenitis.   Marland Kitchen ESOPHAGOGASTRODUODENOSCOPY  2013   Dr. Gala Romney: Possible occult cervical esophageal web status post dilation, small hiatal hernia.  Marland Kitchen HIP  PINNING,CANNULATED Left 07/15/2017   Procedure: CANNULATED HIP PINNING;  Surgeon: Carole Civil, MD;  Location: [facility];  Service: Orthopedics;  Laterality: Left;  Marland Kitchen MICROLARYNGOSCOPY N/A 07/08/2019   Procedure: MICRO DIRECT LARYNGOSOCPY WITH EXCISION VOCAL CORD MASS;  Surgeon: Leta Baptist, MD;  Location: [place];  Service: ENT;  Laterality: N/A;  . PARTIAL HYSTERECTOMY    . POLYPECTOMY  03/25/2018   Procedure: POLYPECTOMY;  Surgeon: Daneil Dolin, MD;  Location: [facility];  Service: Endoscopy;;  sigmoid, ascending, cecal     reports that she has been smoking cigarettes. She has a 45.00 pack-year smoking history. She has never used smokeless tobacco. She reports that she does not drink alcohol or use drugs.  No Known Allergies  Family History  Problem Relation Age of Onset  . Colon cancer Father        41s  . Hypertension Father   . Lung cancer Sister        stage IV   Prior to Admission medications   Medication Sig Start Date End Date Taking? Authorizing Provider  albuterol (PROVENTIL) (2.5 MG/3ML) 0.083% nebulizer solution Take 3 mLs (2.5 mg total) by nebulization every 4 (four) hours as needed for wheezing or shortness of breath. 12/19/18   [name], MD  albuterol (VENTOLIN HFA) 108 (90 Base) MCG/ACT inhaler INHALE 2 PUFFS INTO THE LUNGS EVERY 4 HOURS AS NEEDED FOR WHEEZING ORSHORTNESS OF BREATH. Patient taking differently: Inhale 2 puffs into the lungs every 4 (four) hours as needed for wheezing or shortness of breath. INHALE 2 PUFFS INTO THE LUNGS EVERY 4 HOURS AS NEEDED FOR WHEEZING ORSHORTNESS OF BREATH. 02/14/17   Kathyrn Drown, MD  feeding supplement, ENSURE ENLIVE, (ENSURE ENLIVE) LIQD Take 237 mLs by mouth 2 (two) times daily between meals. 03/23/19   Lavina Hamman, MD  FLUoxetine (PROZAC) 40 MG capsule Take 40 mg by mouth daily.    [provider]  guaiFENesin (MUCINEX) 600 MG 12 hr tablet Take 1 tablet (600 mg total) by mouth 2 (two) times daily. 12/19/18    [name], MD  lisinopril-hydrochlorothiazide (PRINZIDE,ZESTORETIC) 10-12.5 MG tablet Take 1 tablet by mouth daily. 01/02/18   [provider]  omeprazole (PRILOSEC) 20 MG capsule Take 1 capsule (20 mg total) by mouth daily. 08/18/16 08/18/20  Kathyrn Drown, MD  oxyCODONE-acetaminophen (PERCOCET/ROXICET) 5-325 MG tablet Take 1 tablet by mouth every 6 (six) hours as needed for severe pain.    [provider]  pregabalin (LYRICA) 150 MG capsule Take 200 mg by mouth 2 (two) times daily.     [provider]  traZODone (DESYREL) 50 MG tablet Take 1 tablet (50 mg total) by mouth at bedtime. 02/14/17   Luking, Elayne Snare, MD  TRELEGY ELLIPTA 100-62.5-25 MCG/INH AEPB Take 1 puff by mouth daily as needed (breathing).  01/29/18   [provider]    Physical Exam: Vitals:   09/29/19 2032 09/29/19 2245 09/29/19 2300 09/29/19 2330  BP:  (!) 136/96 (!) 148/65 134/63  Pulse:  86    Resp:    20  Temp:      TempSrc:      SpO2:  94%  95%  Weight: 45.4 kg  Height: 5\' 5"  (1.651 m)       Constitutional: NAD, calm, comfortable Eyes: PERRL, lids and conjunctivae mildly injected. ENMT: Mucous membranes are moist. Posterior pharynx clear of any exudate or lesions. Neck: normal, supple, no masses, no thyromegaly Respiratory: Decreased breath sounds with bilateral rhonchi and wheezing, no crackles. Normal respiratory effort. No accessory muscle use.  Cardiovascular: Regular rate and rhythm, no murmurs / rubs / gallops. No extremity edema. 2+ pedal pulses. No carotid bruits.  Abdomen: no tenderness, no masses palpated. No hepatosplenomegaly. Bowel sounds positive.  Musculoskeletal: no clubbing / cyanosis. Good ROM, no contractures. Normal muscle tone.  Skin: no rashes, lesions, ulcers on limited dermatological examination. Neurologic: CN 2-12 grossly intact. Sensation intact, DTR normal. Strength 5/5 in all 4.  Psychiatric: Normal judgment and insight. Alert and oriented  x 3. Normal mood.   Labs on Admission: I have personally reviewed following labs and imaging studies  CBC: Recent Labs  Lab 09/29/19 2150  WBC 7.7  HGB 12.4  HCT 39.5  MCV 104.5*  PLT 99991111   Basic Metabolic Panel: Recent Labs  Lab 09/29/19 2150  NA 137  K 3.3*  CL 96*  CO2 30  GLUCOSE 107*  BUN 9  CREATININE 0.53  CALCIUM 8.6*   GFR: Estimated Creatinine Clearance: 47.6 mL/min (by C-G formula based on SCr of 0.53 mg/dL). Liver Function Tests: No results for input(s): AST, ALT, ALKPHOS, BILITOT, PROT, ALBUMIN in the last 168 hours. No results for input(s): LIPASE, AMYLASE in the last 168 hours. No results for input(s): AMMONIA in the last 168 hours. Coagulation Profile: No results for input(s): INR, PROTIME in the last 168 hours. Cardiac Enzymes: No results for input(s): CKTOTAL, CKMB, CKMBINDEX, TROPONINI in the last 168 hours. BNP (last 3 results) No results for input(s): PROBNP in the last 8760 hours. HbA1C: No results for input(s): HGBA1C in the last 72 hours. CBG: No results for input(s): GLUCAP in the last 168 hours. Lipid Profile: No results for input(s): CHOL, HDL, LDLCALC, TRIG, CHOLHDL, LDLDIRECT in the last 72 hours. Thyroid Function Tests: No results for input(s): TSH, T4TOTAL, FREET4, T3FREE, THYROIDAB in the last 72 hours. Anemia Panel: No results for input(s): VITAMINB12, FOLATE, FERRITIN, TIBC, IRON, RETICCTPCT in the last 72 hours. Urine analysis:    Component Value Date/Time   COLORURINE STRAW (A) 08/25/2019 0103   APPEARANCEUR CLEAR 08/25/2019 0103   LABSPEC 1.003 (L) 08/25/2019 0103   PHURINE 7.0 08/25/2019 0103   GLUCOSEU NEGATIVE 08/25/2019 0103   HGBUR NEGATIVE 08/25/2019 0103   BILIRUBINUR NEGATIVE 08/25/2019 0103   KETONESUR NEGATIVE 08/25/2019 0103   PROTEINUR NEGATIVE 08/25/2019 0103   NITRITE NEGATIVE 08/25/2019 0103   LEUKOCYTESUR NEGATIVE 08/25/2019 0103    Radiological Exams on Admission: DG Chest Port 1 View  Result  Date: 09/29/2019 CLINICAL DATA:  Cough and short of breath EXAM: PORTABLE CHEST 1 VIEW COMPARISON:  08/25/2019, 03/21/2019, 07/15/2017 FINDINGS: Hyperinflation with emphysematous disease. Small focus of airspace disease at the left base. Stable cardiomediastinal silhouette. Probable nipple shadow right lung base. Aortic atherosclerosis. No pneumothorax. IMPRESSION: Hyperinflation with emphysematous disease. Small focus of airspace disease at the left base may reflect early pneumonia Electronically Signed   By: Donavan Foil M.D.   On: 09/29/2019 23:16    EKG: Independently reviewed.  Vent. rate 87 BPM PR interval 134 ms QRS duration 78 ms QT/QTc 402/483 ms P-R-T axes 86 76 83 Sinus rhythm with Premature atrial complexes Minimal voltage criteria for LVH, may be normal variant Nonspecific  T wave abnormality Prolonged QT Abnormal ECG  Assessment/Plan Principal problem:   COPD exacerbation (HCC) Observation/telemetry. Continue supplemental oxygen. Continue bronchodilators. Solu-Medrol 40 mg IVP x4 doses. Switch to oral prednisone after methylprednisolone. Smoking cessation advised. Continue doxycycline p.o. Antitussives as needed.  Active Problems:   Essential hypertension, benign Continue lisinopril-hydrochlorothiazide 10/12.5 mg. Monitor blood pressure, renal function electrolytes.    Hyperlipidemia Not on medical treatment.    Hypokalemia Replacement ordered. Magnesium sulfate 2 g IVPB given. Follow-up potassium level.    Depression with anxiety Continue SSRI once med rec performed. Trazodone as needed nightly.    DVT prophylaxis: Lovenox SQ. Code Status: Full code. Family Communication: Disposition Plan: Observation for COPD exacerbation treatment. Consults called: Admission status: Observation/telemetry.   Reubin Milan MD [facility]  If 7PM-7AM, please contact night-coverage [email or web address]  09/29/2019, 11:58 PM   This document was prepared  using Dragon voice recognition software and may contain some unintended transcription errors.

## 2019-09-29 NOTE — ED Provider Notes (Signed)
[facility] EMERGENCY DEPARTMENT Provider Note   CSN: [id number] Arrival date & time: 09/29/19  1948     History Chief Complaint  Patient presents with  . Shortness of Breath    Amy Rubio is a 68 y.o. female.  HPI   This patient is a 68 year old female, she has a known history of COPD, unfortunately she has had severe disease over time.  And has most recently had prednisone within the last several months.  She has never been on life support.  She reports no exposures to coronavirus and has not had any fevers but has had increasing work of breathing, shortness of breath and frequent cough productive of yellow sputum over the last several days.  She denies swelling of the legs, denies nausea or vomiting, denies diarrhea.  She has been using multiple treatments with albuterol at home with minimal improvement.  She has not had prednisone for this current illness.  She has not been on antibiotics for this current illness.  Symptoms became severe and she presented hypoxic to triage with oxygen levels around 80%.  She has a home pulse oximeter which has been reading in the low 70% range and she does not use home oxygen.  Past Medical History:  Diagnosis Date  . Adenomatous colon polyp 2007   Due surveillance colonoscopy 2012  . Anxiety   . Arthritis   . Chronic pain   . COPD (chronic obstructive pulmonary disease) (HCC)    exacerbations requiring hospitalizations x 2 at [facility]  . Depression   . Dyspnea   . Esophageal stricture 07/2009  . Fibromyalgia   . GERD (gastroesophageal reflux disease)   . Headache(784.0)   . Heart murmur   . Hypertension   . Osteoporosis   . Polio    Right lower extremity weakness  . Post poliomyelitis syndrome     Patient Active Problem List   Diagnosis Date Noted  . Pressure injury of skin 03/20/2019  . COPD (chronic obstructive pulmonary disease) ([place]) 12/19/2018  . COPD with acute exacerbation ([place]) 12/18/2018  . CAP (community  acquired pneumonia) 12/18/2018  . Acute respiratory failure with hypoxia ([place]) 12/18/2018  . Constipation 01/31/2018  . S/P ORIF (open reduction internal fixation) fracture( cannulated hip pins ) 07/15/17 07/30/2017  . Hypokalemia 07/15/2017  . Hyponatremia 07/15/2017  . Closed left hip fracture, initial encounter ([place]) 07/15/2017  . Anxiety 07/15/2017  . Visual field defect 12/13/2015  . Senile purpura ([place]) 06/14/2015  . Hyperlipidemia 10/30/2013  . Osteoporosis 07/31/2013  . Essential hypertension, benign 07/31/2013  . Osteoarthritis 07/31/2013  . Chronic pain syndrome 04/24/2013  . Esophageal dysphagia 10/30/2011  . FH: colon cancer 10/30/2011  . Abnormal CT scan, esophagus 10/30/2011  . ESOPHAGEAL STRICTURE 08/24/2009  . COLONIC POLYPS, ADENOMATOUS, HX OF 08/24/2009  . CIGARETTE SMOKER 08/18/2009  . COPD exacerbation ([place]) 08/18/2009  . WEIGHT LOSS, RECENT 08/18/2009  . CHEST PAIN UNSPECIFIED 08/18/2009    Past Surgical History:  Procedure Laterality Date  . ABDOMINAL HYSTERECTOMY     PARTIAL  . CATARACT EXTRACTION W/PHACO Left 01/26/2015   Procedure: CATARACT EXTRACTION PHACO AND INTRAOCULAR LENS PLACEMENT (IOC);  Surgeon: Rutherford Guys, MD;  Location: [facility] OR[facility];  Service: Ophthalmology;  Laterality: Left;  CDE:7.72  . CATARACT EXTRACTION W/PHACO Right 02/23/2015   Procedure: CATARACT EXTRACTION PHACO AND INTRAOCULAR LENS PLACEMENT (IOC);  Surgeon: Rutherford Guys, MD;  Location: [facility];  Service: Ophthalmology;  Laterality: Right;  CDE:10.68  . COLONOSCOPY  2007   adenoma (  1.3cm). multiple small polyps ablated, diverticulosis  . COLONOSCOPY WITH PROPOFOL N/A 03/25/2018   Procedure: COLONOSCOPY WITH PROPOFOL;  Surgeon: Daneil Dolin, MD;  Location: [facility] ENDO SUITE;  Service: Endoscopy;  Laterality: N/A;  9:15am  . ESOPHAGOGASTRODUODENOSCOPY  07/23/09   distal thickened GEJ, peptic stricture narrowed lumen to 93mm, small hh, moderate gastritis (no H.Pylori), mild duodenitis.   Marland Kitchen  ESOPHAGOGASTRODUODENOSCOPY  2013   Dr. Gala Romney: Possible occult cervical esophageal web status post dilation, small hiatal hernia.  Marland Kitchen HIP PINNING,CANNULATED Left 07/15/2017   Procedure: CANNULATED HIP PINNING;  Surgeon: Carole Civil, MD;  Location: [facility];  Service: Orthopedics;  Laterality: Left;  Marland Kitchen MICROLARYNGOSCOPY N/A 07/08/2019   Procedure: MICRO DIRECT LARYNGOSOCPY WITH EXCISION VOCAL CORD MASS;  Surgeon: Leta Baptist, MD;  Location: [place];  Service: ENT;  Laterality: N/A;  . PARTIAL HYSTERECTOMY    . POLYPECTOMY  03/25/2018   Procedure: POLYPECTOMY;  Surgeon: Daneil Dolin, MD;  Location: [facility];  Service: Endoscopy;;  sigmoid, ascending, cecal     OB History   No obstetric history on file.     Family History  Problem Relation Age of Onset  . Colon cancer Father        65s  . Hypertension Father   . Lung cancer Sister        stage IV    Social History   Tobacco Use  . Smoking status: Current Every Day Smoker    Packs/day: 1.00    Years: 45.00    Pack years: 45.00    Types: Cigarettes  . Smokeless tobacco: Never Used  Substance Use Topics  . Alcohol use: No  . Drug use: No    Home Medications Prior to Admission medications   Medication Sig Start Date End Date Taking? Authorizing Provider  albuterol (PROVENTIL) (2.5 MG/3ML) 0.083% nebulizer solution Take 3 mLs (2.5 mg total) by nebulization every 4 (four) hours as needed for wheezing or shortness of breath. 12/19/18   [name], MD  albuterol (VENTOLIN HFA) 108 (90 Base) MCG/ACT inhaler INHALE 2 PUFFS INTO THE LUNGS EVERY 4 HOURS AS NEEDED FOR WHEEZING ORSHORTNESS OF BREATH. Patient taking differently: Inhale 2 puffs into the lungs every 4 (four) hours as needed for wheezing or shortness of breath. INHALE 2 PUFFS INTO THE LUNGS EVERY 4 HOURS AS NEEDED FOR WHEEZING ORSHORTNESS OF BREATH. 02/14/17   Kathyrn Drown, MD  feeding supplement, ENSURE ENLIVE, (ENSURE ENLIVE) LIQD Take 237 mLs by mouth 2 (two)  times daily between meals. 03/23/19   Lavina Hamman, MD  FLUoxetine (PROZAC) 40 MG capsule Take 40 mg by mouth daily.    [provider]  guaiFENesin (MUCINEX) 600 MG 12 hr tablet Take 1 tablet (600 mg total) by mouth 2 (two) times daily. 12/19/18   [name], MD  lisinopril-hydrochlorothiazide (PRINZIDE,ZESTORETIC) 10-12.5 MG tablet Take 1 tablet by mouth daily. 01/02/18   [provider]  omeprazole (PRILOSEC) 20 MG capsule Take 1 capsule (20 mg total) by mouth daily. 08/18/16 08/18/20  Kathyrn Drown, MD  oxyCODONE-acetaminophen (PERCOCET/ROXICET) 5-325 MG tablet Take 1 tablet by mouth every 6 (six) hours as needed for severe pain.    [provider]  pregabalin (LYRICA) 150 MG capsule Take 200 mg by mouth 2 (two) times daily.     [provider]  traZODone (DESYREL) 50 MG tablet Take 1 tablet (50 mg total) by mouth at bedtime. 02/14/17   Kathyrn Drown, MD  TRELEGY ELLIPTA 100-62.5-25  MCG/INH AEPB Take 1 puff by mouth daily as needed (breathing).  01/29/18   [provider]    Allergies    Patient has no known allergies.  Review of Systems   Review of Systems  All other systems reviewed and are negative.   Physical Exam Updated Vital Signs BP (!) 149/72 (BP Location: Right Arm)   Pulse 84   Temp 98.2 F (36.8 C) (Oral)   Resp (!) 24   Ht 1.651 m (5\' 5" )   Wt 45.4 kg   SpO2 90%   BMI 16.64 kg/m   Physical Exam Vitals and nursing note reviewed.  Constitutional:      General: She is in acute distress.     Appearance: She is well-developed. She is ill-appearing.  HENT:     Head: Normocephalic and atraumatic.     Mouth/Throat:     Pharynx: No oropharyngeal exudate.  Eyes:     General: No scleral icterus.       Right eye: No discharge.        Left eye: No discharge.     Conjunctiva/sclera: Conjunctivae normal.     Pupils: Pupils are equal, round, and reactive to light.  Neck:     Thyroid: No thyromegaly.     Vascular: No  JVD.  Cardiovascular:     Rate and Rhythm: Normal rate and regular rhythm.     Heart sounds: Normal heart sounds. No murmur. No friction rub. No gallop.   Pulmonary:     Effort: Tachypnea and respiratory distress present.     Breath sounds: Wheezing present. No rales.     Comments: The patient is using accessory muscles, she speaks in 3-4 word sentences, she has been using increased work of breathing, she has diffuse expiratory wheezing with decreased lung sounds, there is no rales. Abdominal:     General: Bowel sounds are normal. There is no distension.     Palpations: Abdomen is soft. There is no mass.     Tenderness: There is no abdominal tenderness.  Musculoskeletal:        General: No tenderness. Normal range of motion.     Cervical back: Normal range of motion and neck supple.  Lymphadenopathy:     Cervical: No cervical adenopathy.  Skin:    General: Skin is warm and dry.     Findings: No erythema or rash.  Neurological:     Mental Status: She is alert.     Coordination: Coordination normal.  Psychiatric:        Behavior: Behavior normal.     ED Results / Procedures / Treatments   Labs (all labs ordered are listed, but only abnormal results are displayed) Labs Reviewed  BASIC METABOLIC PANEL - Abnormal; Notable for the following components:      Result Value   Potassium 3.3 (*)    Chloride 96 (*)    Glucose, Bld 107 (*)    Calcium 8.6 (*)    All other components within normal limits  CBC - Abnormal; Notable for the following components:   RBC 3.78 (*)    MCV 104.5 (*)    All other components within normal limits  POC SARS CORONAVIRUS 2 AG -  ED    EKG EKG Interpretation  Date/Time:  Monday September 29 2019 20:39:23 EST Ventricular Rate:  87 PR Interval:  134 QRS Duration: 78 QT Interval:  402 QTC Calculation: 483 R Axis:   76 Text Interpretation: Sinus rhythm with Premature atrial complexes  Minimal voltage criteria for LVH, may be normal variant  Nonspecific T wave abnormality Prolonged QT Abnormal ECG since last tracing no significant change Confirmed by Noemi Chapel [phone]) on 09/29/2019 10:28:48 PM   Radiology DG Chest Port 1 View  Result Date: 09/29/2019 CLINICAL DATA:  Cough and short of breath EXAM: PORTABLE CHEST 1 VIEW COMPARISON:  08/25/2019, 03/21/2019, 07/15/2017 FINDINGS: Hyperinflation with emphysematous disease. Small focus of airspace disease at the left base. Stable cardiomediastinal silhouette. Probable nipple shadow right lung base. Aortic atherosclerosis. No pneumothorax. IMPRESSION: Hyperinflation with emphysematous disease. Small focus of airspace disease at the left base may reflect early pneumonia Electronically Signed   By: Donavan Foil M.D.   On: 09/29/2019 23:16    Procedures .Critical Care Performed by: Noemi Chapel, MD Authorized by: Noemi Chapel, MD   Critical care provider statement:    Critical care time (minutes):  35   Critical care time was exclusive of:  Separately billable procedures and treating other patients and teaching time   Critical care was necessary to treat or prevent imminent or life-threatening deterioration of the following conditions:  Respiratory failure   Critical care was time spent personally by me on the following activities:  Blood draw for specimens, development of treatment plan with patient or surrogate, discussions with consultants, evaluation of patient's response to treatment, examination of patient, obtaining history from patient or surrogate, ordering and performing treatments and interventions, ordering and review of laboratory studies, ordering and review of radiographic studies, pulse oximetry, re-evaluation of patient's condition and review of old charts   (including critical care time)  Medications Ordered in ED Medications  methylPREDNISolone sodium succinate (SOLU-MEDROL) 125 mg/2 mL injection 125 mg (has no administration in time range)  albuterol  (PROVENTIL,VENTOLIN) solution continuous neb (has no administration in time range)  magnesium sulfate IVPB 2 g 50 mL (has no administration in time range)    ED Course  I have reviewed the triage vital signs and the nursing notes.  Pertinent labs & imaging results that were available during my care of the patient were reviewed by me and considered in my medical decision making (see chart for details).    MDM Rules/Calculators/A&P                      The patient has no leukocytosis, no anemia, electrolytes show mild hypokalemia not surprising giving her frequent use of albuterol inhalers today.  At this time I am concerned that the patient has a progressive respiratory illness, she will need a chest x-ray to rule out infiltrate but will likely need to be admitted one way or the other for her hypoxia.  She is requiring oxygen, she is in acute hypoxic respiratory failure likely related to reactive airway disease but I cannot rule out underlying infection.  I will give her doxycycline, continuous nebulizer therapy, Solu-Medrol, test for coronavirus and discussed with the hospitalist for admission.  She will need a higher level of care tonight, she is critically ill  On repeat exam with the patient on oxygen she is doing a little bit better but still wheezing diffusely.  Steroids given, albuterol treatments given, chest x-ray shows a slight infiltrate.  Doxycycline started.  Will discuss with hospitalist for admission given the need for ongoing nebulized treatments.  I have personally viewed the chest x-ray and agree with the interpretation of the radiologist.  Likely early infiltrate at the base  [name] was evaluated in Emergency Department on 09/29/2019 for  the symptoms described in the history of present illness. She was evaluated in the context of the global COVID-19 pandemic, which necessitated consideration that the patient might be at risk for infection with the SARS-CoV-2 virus that  causes COVID-19. Institutional protocols and algorithms that pertain to the evaluation of patients at risk for COVID-19 are in a state of rapid change based on information released by regulatory bodies including the CDC and federal and state organizations. These policies and algorithms were followed during the patient's care in the ED.   Final Clinical Impression(s) / ED Diagnoses Final diagnoses:  Acute respiratory distress  COPD exacerbation ([place])  Community acquired pneumonia, unspecified laterality     Noemi Chapel, MD 09/29/19 [id number]

## 2019-09-30 DIAGNOSIS — T380X5A Adverse effect of glucocorticoids and synthetic analogues, initial encounter: Secondary | ICD-10-CM | POA: Diagnosis present

## 2019-09-30 DIAGNOSIS — E7849 Other hyperlipidemia: Secondary | ICD-10-CM | POA: Diagnosis not present

## 2019-09-30 DIAGNOSIS — R0603 Acute respiratory distress: Secondary | ICD-10-CM | POA: Diagnosis not present

## 2019-09-30 DIAGNOSIS — R739 Hyperglycemia, unspecified: Secondary | ICD-10-CM | POA: Diagnosis present

## 2019-09-30 DIAGNOSIS — Z20822 Contact with and (suspected) exposure to covid-19: Secondary | ICD-10-CM | POA: Diagnosis present

## 2019-09-30 DIAGNOSIS — J441 Chronic obstructive pulmonary disease with (acute) exacerbation: Secondary | ICD-10-CM | POA: Diagnosis not present

## 2019-09-30 DIAGNOSIS — M1990 Unspecified osteoarthritis, unspecified site: Secondary | ICD-10-CM | POA: Diagnosis present

## 2019-09-30 DIAGNOSIS — Z681 Body mass index (BMI) 19 or less, adult: Secondary | ICD-10-CM | POA: Diagnosis not present

## 2019-09-30 DIAGNOSIS — J9601 Acute respiratory failure with hypoxia: Secondary | ICD-10-CM | POA: Diagnosis not present

## 2019-09-30 DIAGNOSIS — Z8249 Family history of ischemic heart disease and other diseases of the circulatory system: Secondary | ICD-10-CM | POA: Diagnosis not present

## 2019-09-30 DIAGNOSIS — F17210 Nicotine dependence, cigarettes, uncomplicated: Secondary | ICD-10-CM | POA: Diagnosis present

## 2019-09-30 DIAGNOSIS — K219 Gastro-esophageal reflux disease without esophagitis: Secondary | ICD-10-CM | POA: Diagnosis present

## 2019-09-30 DIAGNOSIS — F418 Other specified anxiety disorders: Secondary | ICD-10-CM | POA: Diagnosis present

## 2019-09-30 DIAGNOSIS — R531 Weakness: Secondary | ICD-10-CM | POA: Diagnosis present

## 2019-09-30 DIAGNOSIS — B91 Sequelae of poliomyelitis: Secondary | ICD-10-CM | POA: Diagnosis not present

## 2019-09-30 DIAGNOSIS — Z7951 Long term (current) use of inhaled steroids: Secondary | ICD-10-CM | POA: Diagnosis not present

## 2019-09-30 DIAGNOSIS — M797 Fibromyalgia: Secondary | ICD-10-CM | POA: Diagnosis present

## 2019-09-30 DIAGNOSIS — M810 Age-related osteoporosis without current pathological fracture: Secondary | ICD-10-CM | POA: Diagnosis present

## 2019-09-30 DIAGNOSIS — J439 Emphysema, unspecified: Secondary | ICD-10-CM | POA: Diagnosis present

## 2019-09-30 DIAGNOSIS — Z8701 Personal history of pneumonia (recurrent): Secondary | ICD-10-CM | POA: Diagnosis not present

## 2019-09-30 DIAGNOSIS — Z79899 Other long term (current) drug therapy: Secondary | ICD-10-CM | POA: Diagnosis not present

## 2019-09-30 DIAGNOSIS — E785 Hyperlipidemia, unspecified: Secondary | ICD-10-CM | POA: Diagnosis present

## 2019-09-30 DIAGNOSIS — E43 Unspecified severe protein-calorie malnutrition: Secondary | ICD-10-CM | POA: Diagnosis not present

## 2019-09-30 DIAGNOSIS — I10 Essential (primary) hypertension: Secondary | ICD-10-CM | POA: Diagnosis not present

## 2019-09-30 DIAGNOSIS — E876 Hypokalemia: Secondary | ICD-10-CM | POA: Diagnosis present

## 2019-09-30 LAB — CBC
HCT: 39.7 % (ref 36.0–46.0)
Hemoglobin: 12.3 g/dL (ref 12.0–15.0)
MCH: 32.7 pg (ref 26.0–34.0)
MCHC: 31 g/dL (ref 30.0–36.0)
MCV: 105.6 fL — ABNORMAL HIGH (ref 80.0–100.0)
Platelets: 243 10*3/uL (ref 150–400)
RBC: 3.76 MIL/uL — ABNORMAL LOW (ref 3.87–5.11)
RDW: 14.5 % (ref 11.5–15.5)
WBC: 6.5 10*3/uL (ref 4.0–10.5)
nRBC: 0 % (ref 0.0–0.2)

## 2019-09-30 LAB — BASIC METABOLIC PANEL
Anion gap: 14 (ref 5–15)
BUN: 10 mg/dL (ref 8–23)
CO2: 28 mmol/L (ref 22–32)
Calcium: 8.6 mg/dL — ABNORMAL LOW (ref 8.9–10.3)
Chloride: 96 mmol/L — ABNORMAL LOW (ref 98–111)
Creatinine, Ser: 0.49 mg/dL (ref 0.44–1.00)
GFR calc Af Amer: >60 mL/min (ref >60)
GFR calc non Af Amer: >60 mL/min (ref >60)
Glucose, Bld: 230 mg/dL — ABNORMAL HIGH (ref 70–99)
Potassium: 3.4 mmol/L — ABNORMAL LOW (ref 3.5–5.1)
Sodium: 138 mmol/L (ref 135–145)

## 2019-09-30 LAB — RESPIRATORY PANEL BY RT PCR (FLU A&B, COVID)
Influenza A by PCR: NEGATIVE
Influenza B by PCR: NEGATIVE
SARS Coronavirus 2 by RT PCR: NEGATIVE

## 2019-09-30 MED ORDER — ACETAMINOPHEN 650 MG RE SUPP: 650.0000 mg | Freq: Four times a day (QID) | RECTAL | Status: DC | PRN

## 2019-09-30 MED ORDER — ENOXAPARIN SODIUM 30 MG/0.3ML ~~LOC~~ SOLN
30.0000 mg | SUBCUTANEOUS | Status: DC
  Filled 2019-09-30 (×6): qty 0.3

## 2019-09-30 MED ORDER — ENSURE ENLIVE PO LIQD
237.0000 mL | Freq: Two times a day (BID) | ORAL | Status: DC
  Administered 2019-10-02 – 2019-10-06 (×9): 237 mL via ORAL

## 2019-09-30 MED ORDER — PREDNISONE 20 MG PO TABS
40.0000 mg | ORAL_TABLET | Freq: Every day | ORAL | Status: AC
  Administered 2019-10-01 – 2019-10-04 (×4): 40 mg via ORAL
  Filled 2019-09-30 (×4): qty 2

## 2019-09-30 MED ORDER — IPRATROPIUM-ALBUTEROL 0.5-2.5 (3) MG/3ML IN SOLN: 3.0000 mL | Freq: Four times a day (QID) | RESPIRATORY_TRACT | Status: DC

## 2019-09-30 MED ORDER — IPRATROPIUM-ALBUTEROL 0.5-2.5 (3) MG/3ML IN SOLN
3.0000 mL | Freq: Three times a day (TID) | RESPIRATORY_TRACT | Status: DC
  Administered 2019-10-01 – 2019-10-06 (×17): 3 mL via RESPIRATORY_TRACT
  Filled 2019-09-30 (×17): qty 3

## 2019-09-30 MED ORDER — PANTOPRAZOLE SODIUM 40 MG PO TBEC
40.0000 mg | DELAYED_RELEASE_TABLET | Freq: Every day | ORAL | Status: DC
  Administered 2019-09-30 – 2019-10-06 (×7): 40 mg via ORAL
  Filled 2019-09-30 (×7): qty 1

## 2019-09-30 MED ORDER — POTASSIUM CHLORIDE CRYS ER 20 MEQ PO TBCR
40.0000 meq | EXTENDED_RELEASE_TABLET | Freq: Once | ORAL | Status: AC
  Administered 2019-09-30: 40 meq via ORAL
  Filled 2019-09-30: qty 2

## 2019-09-30 MED ORDER — PREGABALIN 75 MG PO CAPS
200.0000 mg | ORAL_CAPSULE | Freq: Two times a day (BID) | ORAL | Status: DC
  Administered 2019-09-30 – 2019-10-06 (×12): 200 mg via ORAL
  Filled 2019-09-30 (×12): qty 1

## 2019-09-30 MED ORDER — OXYCODONE-ACETAMINOPHEN 5-325 MG PO TABS
1.0000 | ORAL_TABLET | Freq: Once | ORAL | Status: AC
  Administered 2019-09-30: 04:00:00 1 via ORAL
  Filled 2019-09-30: qty 1

## 2019-09-30 MED ORDER — ADULT MULTIVITAMIN W/MINERALS CH
1.0000 | ORAL_TABLET | Freq: Every day | ORAL | Status: DC
  Administered 2019-09-30 – 2019-10-06 (×7): 1 via ORAL
  Filled 2019-09-30 (×7): qty 1

## 2019-09-30 MED ORDER — IPRATROPIUM-ALBUTEROL 0.5-2.5 (3) MG/3ML IN SOLN
3.0000 mL | Freq: Four times a day (QID) | RESPIRATORY_TRACT | Status: DC
  Administered 2019-09-30 (×3): 3 mL via RESPIRATORY_TRACT
  Filled 2019-09-30 (×3): qty 3

## 2019-09-30 MED ORDER — OXYCODONE-ACETAMINOPHEN 7.5-325 MG PO TABS
1.0000 | ORAL_TABLET | Freq: Four times a day (QID) | ORAL | Status: DC | PRN
  Administered 2019-09-30 – 2019-10-06 (×22): 1 via ORAL
  Filled 2019-09-30 (×23): qty 1

## 2019-09-30 MED ORDER — INSULIN ASPART 100 UNIT/ML ~~LOC~~ SOLN: 0.0000 [IU] | Freq: Three times a day (TID) | SUBCUTANEOUS | Status: DC

## 2019-09-30 MED ORDER — ACETAMINOPHEN 325 MG PO TABS: 650.0000 mg | ORAL_TABLET | Freq: Four times a day (QID) | ORAL | Status: DC | PRN

## 2019-09-30 MED ORDER — BUDESONIDE 0.25 MG/2ML IN SUSP
0.2500 mg | Freq: Two times a day (BID) | RESPIRATORY_TRACT | Status: DC
  Administered 2019-09-30 – 2019-10-06 (×12): 0.25 mg via RESPIRATORY_TRACT
  Filled 2019-09-30 (×11): qty 2

## 2019-09-30 MED ORDER — LISINOPRIL-HYDROCHLOROTHIAZIDE 10-12.5 MG PO TABS: 1.0000 | ORAL_TABLET | Freq: Every day | ORAL | Status: DC

## 2019-09-30 MED ORDER — HYDROCHLOROTHIAZIDE 12.5 MG PO CAPS
12.5000 mg | ORAL_CAPSULE | Freq: Every day | ORAL | Status: DC
  Administered 2019-09-30 – 2019-10-06 (×7): 12.5 mg via ORAL
  Filled 2019-09-30 (×7): qty 1

## 2019-09-30 MED ORDER — FLUOXETINE HCL 20 MG PO CAPS
40.0000 mg | ORAL_CAPSULE | Freq: Every day | ORAL | Status: DC
  Administered 2019-09-30 – 2019-10-06 (×7): 40 mg via ORAL
  Filled 2019-09-30 (×7): qty 2

## 2019-09-30 MED ORDER — DM-GUAIFENESIN ER 30-600 MG PO TB12
1.0000 | ORAL_TABLET | Freq: Two times a day (BID) | ORAL | Status: DC
  Administered 2019-09-30 – 2019-10-06 (×12): 1 via ORAL
  Filled 2019-09-30 (×12): qty 1

## 2019-09-30 MED ORDER — DOXYCYCLINE HYCLATE 100 MG PO TABS
100.0000 mg | ORAL_TABLET | Freq: Two times a day (BID) | ORAL | Status: DC
  Administered 2019-09-30 – 2019-10-06 (×13): 100 mg via ORAL
  Filled 2019-09-30 (×13): qty 1

## 2019-09-30 MED ORDER — TRAZODONE HCL 50 MG PO TABS
50.0000 mg | ORAL_TABLET | Freq: Every day | ORAL | Status: DC
  Administered 2019-09-30 – 2019-10-05 (×6): 50 mg via ORAL
  Filled 2019-09-30 (×6): qty 1

## 2019-09-30 MED ORDER — LISINOPRIL 10 MG PO TABS
10.0000 mg | ORAL_TABLET | Freq: Every day | ORAL | Status: DC
  Administered 2019-09-30 – 2019-10-06 (×7): 10 mg via ORAL
  Filled 2019-09-30 (×7): qty 1

## 2019-09-30 MED ORDER — POTASSIUM CHLORIDE CRYS ER 20 MEQ PO TBCR
40.0000 meq | EXTENDED_RELEASE_TABLET | Freq: Once | ORAL | Status: AC
  Administered 2019-09-30: 19:00:00 40 meq via ORAL
  Filled 2019-09-30: qty 2

## 2019-09-30 MED ORDER — ALBUTEROL SULFATE (2.5 MG/3ML) 0.083% IN NEBU
2.5000 mg | INHALATION_SOLUTION | RESPIRATORY_TRACT | Status: DC | PRN
  Administered 2019-10-02 – 2019-10-04 (×2): 2.5 mg via RESPIRATORY_TRACT
  Filled 2019-09-30 (×2): qty 3

## 2019-09-30 MED ORDER — METHYLPREDNISOLONE SODIUM SUCC 40 MG IJ SOLR
40.0000 mg | Freq: Four times a day (QID) | INTRAMUSCULAR | Status: AC
  Administered 2019-09-30 (×4): 40 mg via INTRAVENOUS
  Filled 2019-09-30 (×4): qty 1

## 2019-09-30 MED ORDER — BENZONATATE 100 MG PO CAPS
200.0000 mg | ORAL_CAPSULE | Freq: Three times a day (TID) | ORAL | Status: DC | PRN
  Administered 2019-10-02 – 2019-10-06 (×5): 200 mg via ORAL
  Filled 2019-09-30 (×6): qty 2

## 2019-09-30 MED ORDER — INSULIN ASPART 100 UNIT/ML ~~LOC~~ SOLN: 0.0000 [IU] | Freq: Every day | SUBCUTANEOUS | Status: DC

## 2019-09-30 NOTE — Progress Notes (Signed)
Initial Nutrition Assessment  DOCUMENTATION CODES:   Severe malnutrition in context of chronic illness, Underweight  INTERVENTION:  -Ensure Enlive po BID, each supplement provides 350 kcal and 20 grams of protein  -Magic cup TID with meals, each supplement provides 290 kcal and 9 grams of protein  -MVI with minerals daily  -Liberalize diet  -Education   NUTRITION DIAGNOSIS:   Severe Malnutrition related to chronic illness(COPD) as evidenced by energy intake < or equal to 75% for > or equal to 1 month, percent weight loss(11.2% in 6 months).  GOAL:   Weight gain  MONITOR:   Weight trends, PO intake, Supplement acceptance, Diet advancement, Labs, I & O's  REASON FOR ASSESSMENT:   Malnutrition Screening Tool, Consult Assessment of nutrition requirement/status  ASSESSMENT:  RD working remotely.  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 In ED, CXR showed hyperinflation with emphysema and small focus of airspace disease at left base concerning for early pneumonia.  Patient admitted with COPD exacerbation  Spoke with pt via phone this afternoon. She reports that she did not eat breakfast this morning secondary to dislike. Patient asked if it would be possible to have real bacon with breakfast tomorrow. RD spoke with MD via secure chat, will liberalize diet to encourage po intake.   Patient endorses decreased appetite/intake over the past month. Patient recalls usual intake of 2 meals/day and snacks. She has a sausage Biscuit from [employer] for breakfast, drinks 1 chocolate  Boost or Ensure daily and will eat a few bites of dinner prepared by her daughter or grandson who live with her. Patient stated that she likes to snack on chocolate covered cherries by the box full, cookies, ice cream, and typically drinks 10-12 diet pepsi 10 oz cans each day. RD encouraged patient to increase Ensure intake to twice daily and reduce soda intake. Educated on nutrient dense meals and snacks as well as the empty calories provided from soda and sweets.   Patient endorses being chronically underweight and stated that her doctor has been trying to get her to gain weight for years. She recalls UBW of 105-110 lbs and reports weighing 140 lbs several years ago.   Patient meets criteria for severe malnutrition in the context of chronic disease given dietary recall and severe wt loss over the past 6 months. NFPE to be completed at follow up to assess severity of suspected fat/muscle depletions. Patient has been provided education and amenable to chocolate Ensure and Magic Cup during admission.   UBW 140 a few years ago. Current wt 44.2 kg (97.24 lb) Weight history reviewed, noted 12.32 lb (11.2%) wt loss in the past 6 months which is severe for time frame. On 6/21 pt wt 49.8 kg (109.56 lb) Weights 48 kg - 49.9 kg in 2019.   Medications reviewed and include: Doxycycline, Methylprednisolone  Labs: Glucose 107, 230, K 3.4 (L)  NUTRITION - FOCUSED PHYSICAL EXAM: Unable to complete at this time, RD working remotely.  Diet Order:   Diet Order            Diet Heart Room service appropriate? Yes; Fluid consistency: Thin  Diet effective now  EDUCATION NEEDS:   Education needs have been addressed  Skin:  Skin Assessment: Reviewed RN Assessment  Last BM:  12/28  Height:   Ht Readings from Last 1 Encounters:  09/30/19 5\' 5"  (1.651 m)    Weight:   Wt Readings from Last 1 Encounters:  09/30/19 44.2 kg    Ideal Body Weight:  56.8 kg  BMI:  Body mass index is 16.22  kg/m.  Estimated Nutritional Needs:   Kcal:  1350-1550 (30-35 kcal/kg)  Protein:  68-78  Fluid:  >/= 1.3 L/day   Lajuan Lines, RD, LDN Clinical Nutrition [facility] Telephone [phone] After Hours/Weekend Pager: [phone]

## 2019-09-30 NOTE — Progress Notes (Signed)
PROGRESS NOTE    Amy Rubio  [id number] DOB: Feb 16, 1951 DOA: 09/29/2019 PCP: [employer] [employer], MD    Brief Narrative:  68 year old female with a history of COPD, depression, hypertension, was brought to the hospital with complaints of shortness of breath.  She was found to have COPD exacerbation and admitted for further treatment.   Assessment & Plan:   Principal Problem:   COPD exacerbation ([place]) Active Problems:   Essential hypertension, benign   Hyperlipidemia   Hypokalemia   Protein-calorie malnutrition, severe   1. COPD exacerbation.  Continues to be short of breath and is audibly wheezing.  Continue on intravenous steroids.  Continue doxycycline.  Add inhaled steroids.  Add mucolytic's. 2. Acute respiratory failure with hypoxia secondary to COPD exacerbation.  Wean off oxygen as tolerated. 3. Hypertension.  Continue lisinopril/hydrochlorothiazide. 4. Hypokalemia.  Replaced 5. Depression.  Continue on fluoxetine 6. Hyperglycemia.  Related to steroids.  Check A1c.  Start on sliding scale. 7. Severe protein calorie malnutrition.  Nutrition consult.   DVT prophylaxis: Lovenox Code Status: Full code Family Communication: Discussed with patient Disposition Plan: Discharge home once respiratory status has improved   Consultants:     Procedures:     Antimicrobials:   Doxycycline 12/28>   Subjective: Still feels short of breath.  Is audibly wheezing.  Continues to have productive cough.  Objective: Vitals:   09/30/19 0511 09/30/19 0734 09/30/19 1416 09/30/19 1500  BP: (!) 120/52  (!) 160/63   Pulse: 68  63   Resp: 20  20   Temp: 97.9 F (36.6 C)  97.7 F (36.5 C)   TempSrc: Oral  Oral   SpO2: 95% 95% 96% 94%  Weight:      Height:        Intake/Output Summary (Last 24 hours) at 09/30/2019 1802 Last data filed at 09/30/2019 1300 Gross per 24 hour  Intake 600 ml  Output --  Net 600 ml   Filed Weights   09/29/19 2032 09/30/19 0230  Weight:  45.4 kg 44.2 kg    Examination:  General exam: Appears calm and comfortable  Respiratory system: bilateral wheezing. Increased respiratory effort Cardiovascular system: S1 & S2 heard, RRR. No JVD, murmurs, rubs, gallops or clicks. No pedal edema. Gastrointestinal system: Abdomen is nondistended, soft and nontender. No organomegaly or masses felt. Normal bowel sounds heard. Central nervous system: Alert and oriented. No focal neurological deficits. Extremities: Symmetric 5 x 5 power. Skin: No rashes, lesions or ulcers Psychiatry: Judgement and insight appear normal. Mood & affect appropriate.     Data Reviewed: I have personally reviewed following labs and imaging studies  CBC: Recent Labs  Lab 09/29/19 2150 09/30/19 0443  WBC 7.7 6.5  HGB 12.4 12.3  HCT 39.5 39.7  MCV 104.5* 105.6*  PLT 263 0000000   Basic Metabolic Panel: Recent Labs  Lab 09/29/19 2150 09/30/19 0443  NA 137 138  K 3.3* 3.4*  CL 96* 96*  CO2 30 28  GLUCOSE 107* 230*  BUN 9 10  CREATININE 0.53 0.49  CALCIUM 8.6* 8.6*   GFR: Estimated Creatinine Clearance: 46.3 mL/min (by C-G formula based on SCr of 0.49 mg/dL). Liver Function Tests: No results for input(s): AST, ALT, ALKPHOS, BILITOT, PROT, ALBUMIN in the last 168 hours. No results for input(s): LIPASE, AMYLASE in the last 168 hours. No results for input(s): AMMONIA in the last 168 hours. Coagulation Profile: No results for input(s): INR, PROTIME in the last 168 hours. Cardiac Enzymes: No results for input(s): CKTOTAL,  CKMB, CKMBINDEX, TROPONINI in the last 168 hours. BNP (last 3 results) No results for input(s): PROBNP in the last 8760 hours. HbA1C: No results for input(s): HGBA1C in the last 72 hours. CBG: No results for input(s): GLUCAP in the last 168 hours. Lipid Profile: No results for input(s): CHOL, HDL, LDLCALC, TRIG, CHOLHDL, LDLDIRECT in the last 72 hours. Thyroid Function Tests: No results for input(s): TSH, T4TOTAL, FREET4,  T3FREE, THYROIDAB in the last 72 hours. Anemia Panel: No results for input(s): VITAMINB12, FOLATE, FERRITIN, TIBC, IRON, RETICCTPCT in the last 72 hours. Sepsis Labs: No results for input(s): PROCALCITON, LATICACIDVEN in the last 168 hours.  Recent Results (from the past 240 hour(s))  Respiratory Panel by RT PCR (Flu A&B, Covid) - Nasopharyngeal Swab     Status: None   Collection Time: 09/29/19 11:17 PM   Specimen: Nasopharyngeal Swab  Result Value Ref Range Status   SARS Coronavirus 2 by RT PCR NEGATIVE NEGATIVE Final    Comment: (NOTE) SARS-CoV-2 target nucleic acids are NOT DETECTED. The SARS-CoV-2 RNA is generally detectable in upper respiratoy specimens during the acute phase of infection. The lowest concentration of SARS-CoV-2 viral copies this assay can detect is 131 copies/mL. A negative result does not preclude SARS-Cov-2 infection and should not be used as the sole basis for treatment or other patient management decisions. A negative result may occur with  improper specimen collection/handling, submission of specimen other than nasopharyngeal swab, presence of viral mutation(s) within the areas targeted by this assay, and inadequate number of viral copies (<131 copies/mL). A negative result must be combined with clinical observations, patient history, and epidemiological information. The expected result is Negative. Fact Sheet for Patients:  PinkCheek.be Fact Sheet for Healthcare Providers:  GravelBags.it This test is not yet ap proved or cleared by the [place] FDA and  has been authorized for detection and/or diagnosis of SARS-CoV-2 by FDA under an Emergency Use Authorization (EUA). This EUA will remain  in effect (meaning this test can be used) for the duration of the COVID-19 declaration under Section 564(b)(1) of the Act, 21 U.S.C. section 360bbb-3(b)(1), unless the authorization is terminated or revoked  sooner.    Influenza A by PCR NEGATIVE NEGATIVE Final   Influenza B by PCR NEGATIVE NEGATIVE Final    Comment: (NOTE) The Xpert Xpress SARS-CoV-2/FLU/RSV assay is intended as an aid in  the diagnosis of influenza from Nasopharyngeal swab specimens and  should not be used as a sole basis for treatment. Nasal washings and  aspirates are unacceptable for Xpert Xpress SARS-CoV-2/FLU/RSV  testing. Fact Sheet for Patients: PinkCheek.be Fact Sheet for Healthcare Providers: GravelBags.it This test is not yet approved or cleared by the [place] FDA and  has been authorized for detection and/or diagnosis of SARS-CoV-2 by  FDA under an Emergency Use Authorization (EUA). This EUA will remain  in effect (meaning this test can be used) for the duration of the  Covid-19 declaration under Section 564(b)(1) of the Act, 21  U.S.C. section 360bbb-3(b)(1), unless the authorization is  terminated or revoked. Performed at [facility], [address], [place], [place] [address]          Radiology Studies: [facility] Chest [facility] 1 View  Result Date: 09/29/2019 CLINICAL DATA:  Cough and short of breath EXAM: PORTABLE CHEST 1 VIEW COMPARISON:  08/25/2019, 03/21/2019, 07/15/2017 FINDINGS: Hyperinflation with emphysematous disease. Small focus of airspace disease at the left base. Stable cardiomediastinal silhouette. Probable nipple shadow right lung base. Aortic atherosclerosis. No pneumothorax. IMPRESSION:  Hyperinflation with emphysematous disease. Small focus of airspace disease at the left base may reflect early pneumonia Electronically Signed   By: Donavan Foil M.D.   On: 09/29/2019 23:16        Scheduled Meds: . doxycycline  100 mg Oral Q12H  . enoxaparin (LOVENOX) injection  30 mg Subcutaneous Q24H  . feeding supplement (ENSURE ENLIVE)  237 mL Oral BID BM  . ipratropium-albuterol  3 mL Nebulization Q6H  . methylPREDNISolone (SOLU-MEDROL)  injection  40 mg Intravenous Q6H   Followed by  . [START ON 10/01/2019] predniSONE  40 mg Oral Q breakfast  . multivitamin with minerals  1 tablet Oral Daily  . pregabalin  200 mg Oral BID   Continuous Infusions:   LOS: 0 days    Time spent: 2mins    [name], MD [facility]   If 7PM-7AM, please contact night-coverage [email or web address]  09/30/2019, 6:02 PM

## 2019-09-30 NOTE — ED Notes (Signed)
Respiratory unable to give Neb until rapid covid results.

## 2019-09-30 NOTE — Plan of Care (Signed)
  Problem: Acute Rehab PT Goals(only PT should resolve) Goal: Patient Will Transfer Sit To/From Stand 09/30/2019 1125 by Jerian Morais, Vianne Bulls, PT Outcome: Progressing Flowsheets (Taken 09/30/2019 1125) Patient will transfer sit to/from stand: with supervision   Problem: Acute Rehab PT Goals(only PT should resolve) Goal: Pt Will Transfer Bed To Chair/Chair To Bed 09/30/2019 1125 by Shakiyah Cirilo, Vianne Bulls, PT Outcome: Progressing Flowsheets (Taken 09/30/2019 1125) Pt will Transfer Bed to Chair/Chair to Bed: with supervision   Problem: Acute Rehab PT Goals(only PT should resolve) Goal: Pt Will Ambulate 09/30/2019 1126 by Mearl Latin, PT Outcome: Progressing Flowsheets (Taken 09/30/2019 1126) Pt will Ambulate:  50 feet  with supervision   11:27 AM, 09/30/19 Mearl Latin PT, DPT Physical Therapist at [facility]

## 2019-09-30 NOTE — Plan of Care (Signed)
  Problem: Education: Goal: Knowledge of General Education information will improve Description Including pain rating scale, medication(s)/side effects and non-pharmacologic comfort measures Outcome: Progressing   Problem: Health Behavior/Discharge Planning: Goal: Ability to manage health-related needs will improve Outcome: Progressing   

## 2019-09-30 NOTE — Progress Notes (Signed)
Order placed to check pulse oximetry while ambulating. Patient requesting to sleep at this time. Patient stated she wants to do it later on today.

## 2019-09-30 NOTE — Evaluation (Signed)
Occupational Therapy Evaluation Patient Details Name: Amy Rubio MRN: [id number] DOB: April 01, 1951 Today's Date: 09/30/2019    History of Present Illness Amy Rubio is a 68 y.o. 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  She mentions that her O2 sats have been in the 70s at home.  She has been coughing frequently, mostly a dry cough, but occasionally productive of yellow sputum.  Denies hemoptysis.  She denies fever, chills, rhinorrhea or sore throat.  Positive pleuritic chest pain and palpitations from bronchodilators, but denies dizziness, diaphoresis, PND, orthopnea or pitting edema of the lower extremities.  No abdominal pain, nausea, vomiting, diarrhea, constipation, melena or hematochezia.  No dysuria, frequency or hematuria.  Denies polyuria, polydipsia, polyphagia or blurred vision.   Clinical Impression   Pt agreeable to OT evaluation this am. Pt reports she is feeling somewhat improved, continues to become SOB on exertion. Pt performing ADLs independently this am, using O2 during tasks. Pt educated on PLB when SOB, good return for performance. Pt is at baseline with ADL completion, primarily limited by SOB and fatigue. Educated on energy conservation strategies, many of which pt employs at baseline. No further OT services required at this time.     Follow Up Recommendations  No OT follow up    Equipment Recommendations  Tub/shower seat       Precautions / Restrictions Precautions Precautions: None Restrictions Weight Bearing Restrictions: No      Mobility Bed  Mobility Overal bed mobility: Modified Independent                Transfers Overall transfer level: Modified independent Equipment used: Straight cane                      ADL either performed or assessed with clinical judgement   ADL Overall ADL's : Needs assistance/impaired     Grooming: Wash/dry hands;Modified independent;Standing               Lower Body Dressing: Modified independent;Sitting/lateral leans   Toilet Transfer: Modified Independent;Ambulation;Regular Toilet;Grab bars(SPC)   Toileting- Clothing Manipulation and Hygiene: Modified independent;Sitting/lateral lean;Sit to/from stand       Functional mobility during ADLs: Supervision/safety;Cane       Vision Baseline Vision/History: No visual deficits Patient Visual Report: No change from baseline Vision Assessment?: No apparent visual deficits            Pertinent Vitals/Pain Pain Assessment: No/denies pain     Hand Dominance Right   Extremity/Trunk Assessment Upper Extremity Assessment Upper Extremity Assessment: Overall WFL for tasks assessed   Lower Extremity Assessment Lower Extremity Assessment: Defer to PT evaluation   Cervical / Trunk Assessment Cervical / Trunk Assessment: Normal   Communication Communication Communication: No difficulties   Cognition Arousal/Alertness: Awake/alert Behavior During Therapy: WFL for tasks assessed/performed Overall Cognitive Status: Within Functional Limits for tasks assessed                                                Home Living Family/patient expects to be  discharged to:: Private residence Living Arrangements: Other relatives Available Help at Discharge: Family;Available PRN/intermittently Type of Home: House Home Access: Stairs to enter [employer] of Steps: 3 Entrance Stairs-Rails: None Home Layout: One level     Bathroom Shower/Tub: Teacher, early years/pre: Standard      Home Equipment: Cane - single point          Prior Functioning/Environment Level of Independence: Independent with assistive device(s)        Comments: Uses SPC for functional mobility         OT Problem List: Cardiopulmonary status limiting activity       AM-PAC OT "6 Clicks" Daily Activity     Outcome Measure Help from another person eating meals?: None Help from another person taking care of personal grooming?: None Help from another person toileting, which includes using toliet, bedpan, or urinal?: None Help from another person bathing (including washing, rinsing, drying)?: None Help from another person to put on and taking off regular upper body clothing?: None Help from another person to put on and taking off regular lower body clothing?: None 6 Click Score: 24   End of Session Equipment Utilized During Treatment: Oxygen(SPC)  Activity Tolerance: Patient tolerated treatment well Patient left: in bed;with call bell/phone within reach  OT Visit Diagnosis: Muscle weakness (generalized) (M62.81)                Time: [id number] OT Time Calculation (min): 15 min Charges:  OT General Charges $OT Visit: 1 Visit OT Evaluation $OT Eval Low Complexity: 1 Low   Guadelupe Sabin, OTR/L  [phone] 09/30/2019, 8:43 AM

## 2019-09-30 NOTE — Evaluation (Addendum)
Physical Therapy Evaluation Patient Details Name: Amy Rubio MRN: [id number] DOB: 1951/08/02 Today's Date: 09/30/2019   History of Present Illness  Amy Rubio is a 68 y.o. 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  She mentions that her O2 sats have been in the 70s at home.  She has been coughing frequently, mostly a dry cough, but occasionally productive of yellow sputum.  Denies hemoptysis.  She denies fever, chills, rhinorrhea or sore throat.  Positive pleuritic chest pain and palpitations from bronchodilators, but denies dizziness, diaphoresis, PND, orthopnea or pitting edema of the lower extremities.  No abdominal pain, nausea, vomiting, diarrhea, constipation, melena or hematochezia.  No dysuria, frequency or hematuria.  Denies polyuria, polydipsia, polyphagia or blurred vision.     Clinical Impression  Patient limited for functional mobility as stated below secondary to BLE weakness, fatigue and poor standing balance. Patient becomes SOB throughout session. SpO2 monitored throughout today's session: 87-91% with bed mobility on 2 L O2, 88-90% on room air following transfer to standing and when ambulating in room, improves to 91% with education and verbal cueing for pursed lip breathing. Patient is limited by fatigue today and is returned to bed. Patient educated on importance of exercise to build activity tolerance but she is unable to understand the connection. Patient will benefit from continued physical therapy in hospital and recommended venue below to increase strength,  balance, endurance for safe ADLs and gait.     Follow Up Recommendations Home health PT    Equipment Recommendations  None recommended by PT    Recommendations for Other Services       Precautions / Restrictions Precautions Precautions: Fall Restrictions Weight Bearing Restrictions: No      Mobility  Bed Mobility Overal bed mobility: Modified Independent             General bed mobility comments: slightly slow, labored transition to EOB; SpO2 at 87-91 on 2L O2  Transfers Overall transfer level: Modified independent Equipment used: Straight cane             General transfer comment: loss of balance upon standing requiring PT assist for balance; O2 remains between 88-90% on room air when standing  Ambulation/Gait Ambulation/Gait assistance: Min guard Gait Distance (Feet): 20 Feet Assistive device: Straight cane Gait Pattern/deviations: Step-to pattern;Decreased step length - right;Decreased step length - left;Decreased stride length Gait velocity: decreased   General Gait Details: O2 remains between 88-90% on room air when ambulating slowly in room, improves to 91% with pursed lip breathing instructions. Patient ambulates slowly with small labored steps with SPC, some unsteadiness. Gait pattern improves minimally with verbal cueing and demonstration for proper use of SPC.  Stairs            Wheelchair Mobility    Modified Rankin (Stroke Patients Only)       Balance Overall balance assessment: Needs assistance Sitting-balance support: No upper extremity supported;Feet supported Sitting balance-Leahy Scale: Good Sitting balance - Comments: seated EOB   Standing balance support: Single extremity supported Standing balance-Leahy Scale: Fair Standing balance comment: using SPC, loss of balance upon standing initially requireing PT assist to regain balance  Pertinent Vitals/Pain Pain Assessment: No/denies pain     Home Living Family/patient expects to be discharged to:: Private residence Living Arrangements: Other relatives Available Help at Discharge: Family;Available PRN/intermittently Type of Home: House Home Access: Stairs to enter Entrance Stairs-Rails: None Entrance Stairs-Number of Steps: 3 Home Layout: One level Home Equipment: Cane - single point;Bedside commode      Prior Function Level of Independence: Independent with assistive device(s);Needs assistance   Gait / Transfers Assistance Needed: household ambulator with [facility]  ADL's / Homemaking Assistance Needed: daughter assists  Comments: Uses SPC for functional mobility      Hand Dominance   Dominant Hand: Right    Extremity/Trunk Assessment   Upper Extremity Assessment Upper Extremity Assessment: Defer to OT evaluation    Lower Extremity Assessment Lower Extremity Assessment: Generalized weakness    Cervical / Trunk Assessment Cervical / Trunk Assessment: Normal  Communication   Communication: No difficulties  Cognition Arousal/Alertness: Awake/alert Behavior During Therapy: WFL for tasks assessed/performed Overall Cognitive Status: Within Functional Limits for tasks assessed                                        General Comments      Exercises     Assessment/Plan    PT Assessment Patient needs continued PT services  PT Problem List Decreased strength;Decreased range of motion;Decreased activity tolerance;Decreased balance;Decreased mobility;Decreased safety awareness;Cardiopulmonary status limiting activity       PT Treatment Interventions DME instruction;Gait training;Stair training;Functional mobility training;Therapeutic activities;Therapeutic exercise;Balance training;Neuromuscular re-education;Patient/family education;Manual techniques;Modalities    PT Goals (Current goals can be found in the Care Plan section)  Acute Rehab PT Goals Patient Stated Goal: return home and breathe  better PT Goal Formulation: With patient Time For Goal Achievement: 10/14/19 Potential to Achieve Goals: Good    Frequency Min 3X/week   Barriers to discharge        Co-evaluation               AM-PAC PT "6 Clicks" Mobility  Outcome Measure Help needed turning from your back to your side while in a flat bed without using bedrails?: None Help needed moving from lying on your back to sitting on the side of a flat bed without using bedrails?: None Help needed moving to and from a bed to a chair (including a wheelchair)?: A Little Help needed standing up from a chair using your arms (e.g., wheelchair or bedside chair)?: A Little Help needed to walk in hospital room?: A Little Help needed climbing 3-5 steps with a railing? : A Lot 6 Click Score: 19    End of Session Equipment Utilized During Treatment: Gait belt;Oxygen Activity Tolerance: Patient tolerated treatment well;Patient limited by fatigue Patient left: in bed;with call bell/phone within reach Nurse Communication: Mobility status PT Visit Diagnosis: Other abnormalities of gait and mobility (R26.89);Unsteadiness on feet (R26.81);Muscle weakness (generalized) (M62.81)    Time: 1010-1037 PT Time Calculation (min) (ACUTE ONLY): 27 min   Charges:   PT Evaluation $PT Eval Low Complexity: 1 Low PT Treatments $Therapeutic Activity: 23-37 mins        11:22 AM, 09/30/19 Mearl Latin PT, DPT Physical Therapist at [facility]

## 2019-09-30 NOTE — Care Management Obs Status (Signed)
[place] NOTIFICATION   Patient Details  Name: NORHAN TANZILLO MRN: [id number] Date of Birth: 05-31-1951   Medicare Observation Status Notification Given:  Yes    [place], LCSW 09/30/2019, 4:59 PM

## 2019-09-30 NOTE — Progress Notes (Signed)
CSW in contact with patient to offer Home health services that were recommended by PT upon evaluation. Pt explained that she has had home health before and is wishing to decline at this time. Pt declining Home health at this time. Pt goes into detail and states that she has adult children that are able to care for her. CSW expressed understanding to respect patient rights.   TOC team will continue to follow patient for discharge related needs. Pt may discharge home with O2.   [place] [facility]  Clinical Social Worker  Ph: [phone]

## 2019-09-30 NOTE — Progress Notes (Signed)
Pt refusing to have blood sugar checked, stating that she is not diabetic. Explained steroids that she is taking could increase blood sugar so we are monitoring d/t that. She continued to refuse "I just don't want to be stuck."

## 2019-10-01 LAB — HEMOGLOBIN A1C
Hgb A1c MFr Bld: 5.3 % (ref 4.8–5.6)
Mean Plasma Glucose: 105.41 mg/dL

## 2019-10-01 NOTE — Plan of Care (Signed)

## 2019-10-01 NOTE — Progress Notes (Signed)
Patient refused blood sugar check

## 2019-10-01 NOTE — Progress Notes (Signed)
Patient refused to have her blood sugar checked this morning.

## 2019-10-01 NOTE — Progress Notes (Signed)
PROGRESS NOTE    Amy Rubio  [id number] DOB: 08-18-1951 DOA: 09/29/2019 PCP: Redmond School, MD    Brief Narrative:  68 year old female with a history of COPD, depression, hypertension, was brought to the hospital with complaints of shortness of breath.  She was found to have COPD exacerbation and admitted for further treatment.   Assessment & Plan:   Principal Problem:   COPD exacerbation ([place]) Active Problems:   Essential hypertension, benign   Hyperlipidemia   Hypokalemia   Protein-calorie malnutrition, severe   Acute respiratory distress   1. COPD exacerbation.  She does not feel significantly improved and continues to have significant wheezing and cough.  Continue on intravenous steroids.  Continue doxycycline, inhaled steroids, mucolytic's. 2. Acute respiratory failure with hypoxia secondary to COPD exacerbation.  Wean off oxygen as tolerated. 3. Hypertension.  Continue lisinopril/hydrochlorothiazide. 4. Hypokalemia.  Replaced 5. Depression.  Continue on fluoxetine 6. Hyperglycemia.  Related to steroids. A1c 5.3.  Continue on sliding scale. 7. Severe protein calorie malnutrition.  Nutrition consult.   DVT prophylaxis: Lovenox Code Status: Full code Family Communication: Discussed with patient Disposition Plan: Discharge home once respiratory status has improved   Consultants:     Procedures:     Antimicrobials:   Doxycycline 12/28>   Subjective: Still feels very short of breath.  Continues to have significant wheezing and coughing.  Objective: Vitals:   10/01/19 0729 10/01/19 1406 10/01/19 1555 10/01/19 1921  BP:   (!) 121/49   Pulse:   (!) 59   Resp:   20   Temp:   98.4 F (36.9 C)   TempSrc:   Oral   SpO2: 100% 98% 96% 95%  Weight:      Height:       No intake or output data in the 24 hours ending 10/01/19 [place] Weights   09/29/19 2032 09/30/19 0230  Weight: 45.4 kg 44.2 kg    Examination:  General exam: Alert, awake,  oriented x 3 Respiratory system: persistent bilateral wheezing. Respiratory effort normal. Cardiovascular system:RRR. No murmurs, rubs, gallops. Gastrointestinal system: Abdomen is nondistended, soft and nontender. No organomegaly or masses felt. Normal bowel sounds heard. Central nervous system: Alert and oriented. No focal neurological deficits. Extremities: No C/C/E, +pedal pulses Skin: No rashes, lesions or ulcers Psychiatry: Judgement and insight appear normal. Mood & affect appropriate.      Data Reviewed: I have personally reviewed following labs and imaging studies  CBC: Recent Labs  Lab 09/29/19 2150 09/30/19 0443  WBC 7.7 6.5  HGB 12.4 12.3  HCT 39.5 39.7  MCV 104.5* 105.6*  PLT 263 0000000   Basic Metabolic Panel: Recent Labs  Lab 09/29/19 2150 09/30/19 0443  NA 137 138  K 3.3* 3.4*  CL 96* 96*  CO2 30 28  GLUCOSE 107* 230*  BUN 9 10  CREATININE 0.53 0.49  CALCIUM 8.6* 8.6*   GFR: Estimated Creatinine Clearance: 46.3 mL/min (by C-G formula based on SCr of 0.49 mg/dL). Liver Function Tests: No results for input(s): AST, ALT, ALKPHOS, BILITOT, PROT, ALBUMIN in the last 168 hours. No results for input(s): LIPASE, AMYLASE in the last 168 hours. No results for input(s): AMMONIA in the last 168 hours. Coagulation Profile: No results for input(s): INR, PROTIME in the last 168 hours. Cardiac Enzymes: No results for input(s): CKTOTAL, CKMB, CKMBINDEX, TROPONINI in the last 168 hours. BNP (last 3 results) No results for input(s): PROBNP in the last 8760 hours. HbA1C: Recent Labs    09/30/19 0443  HGBA1C 5.3   CBG: No results for input(s): GLUCAP in the last 168 hours. Lipid Profile: No results for input(s): CHOL, HDL, LDLCALC, TRIG, CHOLHDL, LDLDIRECT in the last 72 hours. Thyroid Function Tests: No results for input(s): TSH, T4TOTAL, FREET4, T3FREE, THYROIDAB in the last 72 hours. Anemia Panel: No results for input(s): VITAMINB12, FOLATE, FERRITIN, TIBC,  IRON, RETICCTPCT in the last 72 hours. Sepsis Labs: No results for input(s): PROCALCITON, LATICACIDVEN in the last 168 hours.  Recent Results (from the past 240 hour(s))  Respiratory Panel by RT PCR (Flu A&B, Covid) - Nasopharyngeal Swab     Status: None   Collection Time: 09/29/19 11:17 PM   Specimen: Nasopharyngeal Swab  Result Value Ref Range Status   SARS Coronavirus 2 by RT PCR NEGATIVE NEGATIVE Final    Comment: (NOTE) SARS-CoV-2 target nucleic acids are NOT DETECTED. The SARS-CoV-2 RNA is generally detectable in upper respiratoy specimens during the acute phase of infection. The lowest concentration of SARS-CoV-2 viral copies this assay can detect is 131 copies/mL. A negative result does not preclude SARS-Cov-2 infection and should not be used as the sole basis for treatment or other patient management decisions. A negative result may occur with  improper specimen collection/handling, submission of specimen other than nasopharyngeal swab, presence of viral mutation(s) within the areas targeted by this assay, and inadequate number of viral copies (<131 copies/mL). A negative result must be combined with clinical observations, patient history, and epidemiological information. The expected result is Negative. Fact Sheet for Patients:  PinkCheek.be Fact Sheet for Healthcare Providers:  GravelBags.it This test is not yet ap proved or cleared by the [place] FDA and  has been authorized for detection and/or diagnosis of SARS-CoV-2 by FDA under an Emergency Use Authorization (EUA). This EUA will remain  in effect (meaning this test can be used) for the duration of the COVID-19 declaration under Section 564(b)(1) of the Act, 21 U.S.C. section 360bbb-3(b)(1), unless the authorization is terminated or revoked sooner.    Influenza A by PCR NEGATIVE NEGATIVE Final   Influenza B by PCR NEGATIVE NEGATIVE Final    Comment:  (NOTE) The Xpert Xpress SARS-CoV-2/FLU/RSV assay is intended as an aid in  the diagnosis of influenza from Nasopharyngeal swab specimens and  should not be used as a sole basis for treatment. Nasal washings and  aspirates are unacceptable for Xpert Xpress SARS-CoV-2/FLU/RSV  testing. Fact Sheet for Patients: PinkCheek.be Fact Sheet for Healthcare Providers: GravelBags.it This test is not yet approved or cleared by the [place] FDA and  has been authorized for detection and/or diagnosis of SARS-CoV-2 by  FDA under an Emergency Use Authorization (EUA). This EUA will remain  in effect (meaning this test can be used) for the duration of the  Covid-19 declaration under Section 564(b)(1) of the Act, 21  U.S.C. section 360bbb-3(b)(1), unless the authorization is  terminated or revoked. Performed at [facility], [address] [place], [place] [address]          Radiology Studies: [facility] [place] [facility] View  Result Date: 09/29/2019 CLINICAL DATA:  Cough and short of breath EXAM: PORTABLE CHEST 1 VIEW COMPARISON:  08/25/2019, 03/21/2019, 07/15/2017 FINDINGS: Hyperinflation with emphysematous disease. Small focus of airspace disease at the left base. Stable cardiomediastinal silhouette. Probable nipple shadow right lung base. Aortic atherosclerosis. No pneumothorax. IMPRESSION: Hyperinflation with emphysematous disease. Small focus of airspace disease at the left base may reflect early pneumonia Electronically Signed   By: Donavan Foil M.D.   On:  09/29/2019 23:16        Scheduled Meds: . budesonide (PULMICORT) nebulizer solution  0.25 mg Nebulization BID  . dextromethorphan-guaiFENesin  1 tablet Oral BID  . doxycycline  100 mg Oral Q12H  . enoxaparin (LOVENOX) injection  30 mg Subcutaneous Q24H  . feeding supplement (ENSURE ENLIVE)  237 mL Oral BID BM  . FLUoxetine  40 mg Oral Daily  . hydrochlorothiazide  12.5 mg Oral Daily   . insulin aspart  0-20 Units Subcutaneous TID WC  . insulin aspart  0-5 Units Subcutaneous QHS  . ipratropium-albuterol  3 mL Nebulization TID  . lisinopril  10 mg Oral Daily  . multivitamin with minerals  1 tablet Oral Daily  . pantoprazole  40 mg Oral Daily  . predniSONE  40 mg Oral Q breakfast  . pregabalin  200 mg Oral BID  . traZODone  50 mg Oral QHS   Continuous Infusions:   LOS: 1 day    Time spent: 61mins    [name], MD [facility]   If 7PM-7AM, please contact night-coverage [email or web address]  10/01/2019, 7:54 PM

## 2019-10-01 NOTE — Progress Notes (Signed)
Physical Therapy Treatment Patient Details Name: Amy Rubio MRN: [id number] DOB: 1951-03-11 Today's Date: 10/01/2019    History of Present Illness Amy Rubio is a 68 y.o. 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  She mentions that her O2 sats have been in the 70s at home.  She has been coughing frequently, mostly a dry cough, but occasionally productive of yellow sputum.  Denies hemoptysis.  She denies fever, chills, rhinorrhea or sore throat.  Positive pleuritic chest pain and palpitations from bronchodilators, but denies dizziness, diaphoresis, PND, orthopnea or pitting edema of the lower extremities.  No abdominal pain, nausea, vomiting, diarrhea, constipation, melena or hematochezia.  No dysuria, frequency or hematuria.  Denies polyuria, polydipsia, polyphagia or blurred vision.    PT Comments    Patient demonstrates increased endurance/distance for ambulation with unsteady cadence and occasional stumbling, drifting without loss of balance, on room air with SpO2 at 88%, dropped to 87% when return to room and put on 2 LPM O2 with SpO2 increasing to 92% - RN notified.  Patient will benefit from continued physical therapy in hospital and recommended venue below to increase strength, balance, endurance for safe ADLs and gait.    Follow Up Recommendations  Home health PT     Equipment Recommendations  None recommended by PT    Recommendations for Other Services       Precautions / Restrictions Precautions Precautions: Fall Restrictions Weight Bearing Restrictions: No     Mobility  Bed Mobility Overal bed mobility: Modified Independent             General bed mobility comments: slightly increased time  Transfers Overall transfer level: Modified independent Equipment used: Straight cane             General transfer comment: slightly unsteady on feet  Ambulation/Gait Ambulation/Gait assistance: Supervision;Min guard Gait Distance (Feet): 50 Feet Assistive device: Straight cane Gait Pattern/deviations: Step-to pattern;Decreased step length - left;Decreased stance time - right;Decreased stride length Gait velocity: decreased   General Gait Details: slow unsteady cadence with occasional stumbling and drifting left/right without loss of balance, on room air with SpO2 at 88%, limited secondary to c/o fatigue   Stairs             Wheelchair Mobility    Modified Rankin (Stroke Patients Only)       Balance Overall balance assessment: Needs assistance Sitting-balance support: Feet supported;No upper extremity supported Sitting balance-Leahy Scale: Good Sitting balance - Comments: seated EOB   Standing balance support: Single extremity supported Standing balance-Leahy Scale: Fair Standing balance comment: using SPC                            Cognition Arousal/Alertness: Awake/alert Behavior During Therapy: WFL for tasks assessed/performed Overall Cognitive Status: Within Functional Limits for tasks assessed                                        Exercises      General Comments        Pertinent Vitals/Pain  Pain Assessment: Faces Faces Pain Scale: Hurts little more Pain Location: generalized pain all over body Pain Descriptors / Indicators: Aching Pain Intervention(s): Limited activity within patient's tolerance;Monitored during session;Premedicated before session    Home Living                      Prior Function            PT Goals (current goals can now be found in the  care plan section) Acute Rehab PT Goals Patient Stated Goal: return home and breathe better PT Goal Formulation: With patient Time For Goal Achievement: 10/14/19 Potential to Achieve Goals: Good Progress towards PT goals: Progressing toward goals    Frequency    Min 3X/week      PT Plan Current plan remains appropriate    Co-evaluation              AM-PAC PT "6 Clicks" Mobility   Outcome Measure  Help needed turning from your back to your side while in a flat bed without using bedrails?: None Help needed moving from lying on your back to sitting on the side of a flat bed without using bedrails?: None Help needed moving to and from a bed to a chair (including a wheelchair)?: A Little Help needed standing up from a chair using your arms (e.g., wheelchair or bedside chair)?: A Little Help needed to walk in hospital room?: A Little Help needed climbing 3-5 steps with a railing? : A Little 6 Click Score: 20    End of Session Equipment Utilized During Treatment: Oxygen Activity Tolerance: Patient tolerated treatment well;Patient limited by fatigue Patient left: in bed;with call bell/phone within reach Nurse Communication: Mobility status PT Visit Diagnosis: Other abnormalities of gait and mobility (R26.89);Unsteadiness on feet (R26.81);Muscle weakness (generalized) (M62.81)     Time: [id number] PT Time Calculation (min) (ACUTE ONLY): 21 min  Charges:  $Gait Training: 8-22 mins                     3:59 PM, 10/01/19 Lonell Grandchild, MPT Physical Therapist with [facility] 336 [phone] office [phone] mobile phone

## 2019-10-01 NOTE — Progress Notes (Signed)
Nutrition Brief Note   RD consulted for malnutrition on 12/29  Patient identified on Malnutrition Screening Tool (MST) Report as well as consulted for assessment of nutritional status on 12/28. RD saw patient on 12/29, patient met criteria and was diagnosed with malnutrition at that time. Please see RD note on 12/29 for interventions.     Nutrition department is following, No additional nutrition interventions warranted at this time.     Lajuan Lines, RD, [facility] Nutrition Office Telephone [phone] After Hours/Weekend Pager: [phone]

## 2019-10-02 MED ORDER — HYDROXYZINE HCL 25 MG PO TABS
25.0000 mg | ORAL_TABLET | Freq: Three times a day (TID) | ORAL | Status: DC | PRN
  Administered 2019-10-02 – 2019-10-06 (×4): 25 mg via ORAL
  Filled 2019-10-02 (×4): qty 1

## 2019-10-02 MED ORDER — INSULIN ASPART 100 UNIT/ML ~~LOC~~ SOLN: 0.0000 [IU] | Freq: Every day | SUBCUTANEOUS | Status: DC

## 2019-10-02 MED ORDER — INSULIN ASPART 100 UNIT/ML ~~LOC~~ SOLN: 0.0000 [IU] | Freq: Three times a day (TID) | SUBCUTANEOUS | Status: DC

## 2019-10-02 NOTE — Progress Notes (Signed)
PROGRESS NOTE    Amy Rubio  [id number] DOB: [birth year] DOA: 09/29/2019 PCP: Redmond School, MD    Brief Narrative:  68 year old female with a history of COPD, depression, hypertension, was brought to the hospital with complaints of shortness of breath.  She was found to have COPD exacerbation and admitted for further treatment.   Assessment & Plan:   Principal Problem:   COPD exacerbation ([place]) Active Problems:   Essential hypertension, benign   Hyperlipidemia   Hypokalemia   Protein-calorie malnutrition, severe   Acute respiratory distress   1. COPD exacerbation.  She does not feel significantly improved and continues to have significant wheezing and cough.  Due to the severity of her COPD, anticipate slow progress.  Continue on intravenous steroids.  Continue doxycycline, inhaled steroids, mucolytic's. 2. Acute respiratory failure with hypoxia secondary to COPD exacerbation.  Wean off oxygen as tolerated. 3. Hypertension.  Continue lisinopril/hydrochlorothiazide. 4. Hypokalemia.  Replaced 5. Depression.  Continue on fluoxetine 6. Hyperglycemia.  Related to steroids. A1c 5.3.  Continue on sliding scale. 7. Severe protein calorie malnutrition.  Nutrition consult.   DVT prophylaxis: Lovenox Code Status: Full code Family Communication: Discussed with patient Disposition Plan: Discharge home once respiratory status has improved   Consultants:     Procedures:     Antimicrobials:   Doxycycline 12/28>   Subjective: Feels weak.  Dizzy on standing.  Continues to feel short of breath and wheezing.  Objective: Vitals:   10/02/19 0752 10/02/19 1320 10/02/19 1712 10/02/19 1944  BP:   134/78   Pulse:   90   Resp:   18   Temp:   98.1 F (36.7 C)   TempSrc:   Oral   SpO2: 98% 97% 94% 97%  Weight:      Height:        Intake/Output Summary (Last 24 hours) at 10/02/2019 2005 Last data filed at 10/02/2019 0500 Gross per 24 hour  Intake --  Output 200 ml   Net -200 ml   Filed Weights   09/29/19 2032 09/30/19 0230  Weight: 45.4 kg 44.2 kg    Examination:  General exam: Alert, awake, oriented x 3 Respiratory system: Bilateral wheezing.  Respiratory effort normal. Cardiovascular system:RRR. No murmurs, rubs, gallops. Gastrointestinal system: Abdomen is nondistended, soft and nontender. No organomegaly or masses felt. Normal bowel sounds heard. Central nervous system: Alert and oriented. No focal neurological deficits. Extremities: No C/C/E, +pedal pulses Skin: No rashes, lesions or ulcers Psychiatry: Judgement and insight appear normal. Mood & affect appropriate.      Data Reviewed: I have personally reviewed following labs and imaging studies  CBC: Recent Labs  Lab 09/29/19 2150 09/30/19 0443  WBC 7.7 6.5  HGB 12.4 12.3  HCT 39.5 39.7  MCV 104.5* 105.6*  PLT 263 0000000   Basic Metabolic Panel: Recent Labs  Lab 09/29/19 2150 09/30/19 0443  NA 137 138  K 3.3* 3.4*  CL 96* 96*  CO2 30 28  GLUCOSE 107* 230*  BUN 9 10  CREATININE 0.53 0.49  CALCIUM 8.6* 8.6*   GFR: Estimated Creatinine Clearance: 46.3 mL/min (by C-G formula based on SCr of 0.49 mg/dL). Liver Function Tests: No results for input(s): AST, ALT, ALKPHOS, BILITOT, PROT, ALBUMIN in the last 168 hours. No results for input(s): LIPASE, AMYLASE in the last 168 hours. No results for input(s): AMMONIA in the last 168 hours. Coagulation Profile: No results for input(s): INR, PROTIME in the last 168 hours. Cardiac Enzymes: No results for input(s): CKTOTAL,  CKMB, CKMBINDEX, TROPONINI in the last 168 hours. BNP (last 3 results) No results for input(s): PROBNP in the last 8760 hours. HbA1C: Recent Labs    09/30/19 0443  HGBA1C 5.3   CBG: No results for input(s): GLUCAP in the last 168 hours. Lipid Profile: No results for input(s): CHOL, HDL, LDLCALC, TRIG, CHOLHDL, LDLDIRECT in the last 72 hours. Thyroid Function Tests: No results for input(s): TSH,  T4TOTAL, FREET4, T3FREE, THYROIDAB in the last 72 hours. Anemia Panel: No results for input(s): VITAMINB12, FOLATE, FERRITIN, TIBC, IRON, RETICCTPCT in the last 72 hours. Sepsis Labs: No results for input(s): PROCALCITON, LATICACIDVEN in the last 168 hours.  Recent Results (from the past 240 hour(s))  Respiratory Panel by RT PCR (Flu A&B, Covid) - Nasopharyngeal Swab     Status: None   Collection Time: 09/29/19 11:17 PM   Specimen: Nasopharyngeal Swab  Result Value Ref Range Status   SARS Coronavirus 2 by RT PCR NEGATIVE NEGATIVE Final    Comment: (NOTE) SARS-CoV-2 target nucleic acids are NOT DETECTED. The SARS-CoV-2 RNA is generally detectable in upper respiratoy specimens during the acute phase of infection. The lowest concentration of SARS-CoV-2 viral copies this assay can detect is 131 copies/mL. A negative result does not preclude SARS-Cov-2 infection and should not be used as the sole basis for treatment or other patient management decisions. A negative result may occur with  improper specimen collection/handling, submission of specimen other than nasopharyngeal swab, presence of viral mutation(s) within the areas targeted by this assay, and inadequate number of viral copies (<131 copies/mL). A negative result must be combined with clinical observations, patient history, and epidemiological information. The expected result is Negative. Fact Sheet for Patients:  PinkCheek.be Fact Sheet for Healthcare Providers:  GravelBags.it This test is not yet ap proved or cleared by the [place] FDA and  has been authorized for detection and/or diagnosis of SARS-CoV-2 by FDA under an Emergency Use Authorization (EUA). This EUA will remain  in effect (meaning this test can be used) for the duration of the COVID-19 declaration under Section 564(b)(1) of the Act, 21 U.S.C. section 360bbb-3(b)(1), unless the authorization is  terminated or revoked sooner.    Influenza A by PCR NEGATIVE NEGATIVE Final   Influenza B by PCR NEGATIVE NEGATIVE Final    Comment: (NOTE) The Xpert Xpress SARS-CoV-2/FLU/RSV assay is intended as an aid in  the diagnosis of influenza from Nasopharyngeal swab specimens and  should not be used as a sole basis for treatment. Nasal washings and  aspirates are unacceptable for Xpert Xpress SARS-CoV-2/FLU/RSV  testing. Fact Sheet for Patients: PinkCheek.be Fact Sheet for Healthcare Providers: GravelBags.it This test is not yet approved or cleared by the [place] FDA and  has been authorized for detection and/or diagnosis of SARS-CoV-2 by  FDA under an Emergency Use Authorization (EUA). This EUA will remain  in effect (meaning this test can be used) for the duration of the  Covid-19 declaration under Section 564(b)(1) of the Act, 21  U.S.C. section 360bbb-3(b)(1), unless the authorization is  terminated or revoked. Performed at [facility], [address] [place], [place] [address]          Radiology Studies: No results found.      Scheduled Meds: . budesonide (PULMICORT) nebulizer solution  0.25 mg Nebulization BID  . dextromethorphan-guaiFENesin  1 tablet Oral BID  . doxycycline  100 mg Oral Q12H  . enoxaparin (LOVENOX) injection  30 mg Subcutaneous Q24H  . feeding supplement (ENSURE ENLIVE)  237 mL Oral BID BM  . FLUoxetine  40 mg Oral Daily  . hydrochlorothiazide  12.5 mg Oral Daily  . insulin aspart  0-20 Units Subcutaneous TID WC  . insulin aspart  0-5 Units Subcutaneous QHS  . ipratropium-albuterol  3 mL Nebulization TID  . lisinopril  10 mg Oral Daily  . multivitamin with minerals  1 tablet Oral Daily  . pantoprazole  40 mg Oral Daily  . predniSONE  40 mg Oral Q breakfast  . pregabalin  200 mg Oral BID  . traZODone  50 mg Oral QHS   Continuous Infusions:   LOS: 2 days    Time spent:  38mins    [name], MD [facility]   If 7PM-7AM, please contact night-coverage [email or web address]  10/02/2019, 8:05 PM

## 2019-10-03 LAB — GLUCOSE, CAPILLARY: Glucose-Capillary: 188 mg/dL — ABNORMAL HIGH (ref 70–99)

## 2019-10-03 MED ORDER — MENTHOL 3 MG MT LOZG
1.0000 | LOZENGE | OROMUCOSAL | Status: DC | PRN
  Filled 2019-10-03: qty 9

## 2019-10-03 NOTE — Progress Notes (Signed)
PROGRESS NOTE    Amy Rubio  [id number] DOB: 10/28/1950 DOA: 09/29/2019 PCP: Redmond School, MD    Brief Narrative:  69 year old female with a history of COPD, depression, hypertension, was brought to the hospital with complaints of shortness of breath.  She was found to have COPD exacerbation and admitted for further treatment.   Assessment & Plan:   Principal Problem:   COPD exacerbation ([place]) Active Problems:   Essential hypertension, benign   Hyperlipidemia   Hypokalemia   Protein-calorie malnutrition, severe   Acute respiratory distress   1. COPD exacerbation.  Patient has advanced COPD and is making slow progress.  Her overall wheezing is slowly improving, but not back to baseline.  She still becomes very short of breath and dizzy on ambulation.  We will continue on steroids, bronchodilators and antibiotics for now.  If she continues to improve, consider transitioning to prednisone tomorrow. 2. Acute respiratory failure with hypoxia secondary to COPD exacerbation.  Wean off oxygen as tolerated. 3. Hypertension.  Continue lisinopril/hydrochlorothiazide. 4. Hypokalemia.  Replaced 5. Depression.  Continue on fluoxetine 6. Hyperglycemia.  Related to steroids. A1c 5.3.  Continue on sliding scale. 7. Severe protein calorie malnutrition.  Nutrition consult.   DVT prophylaxis: Lovenox Code Status: Full code Family Communication: Discussed with patient Disposition Plan: Discharge home once respiratory status has improved   Consultants:     Procedures:     Antimicrobials:   Doxycycline 12/28>   Subjective: Continues to feel weak and dizzy on ambulation.  Continues to feel short of breath and is wheezing  Objective: Vitals:   10/03/19 0811 10/03/19 1108 10/03/19 1125 10/03/19 1414  BP:      Pulse:      Resp:      Temp:      TempSrc:      SpO2: 100% (!) 85% 92% 95%  Weight:      Height:        Intake/Output Summary (Last 24 hours) at 10/03/2019  1835 Last data filed at 10/03/2019 1347 Gross per 24 hour  Intake --  Output 1000 ml  Net -1000 ml   Filed Weights   09/29/19 2032 09/30/19 0230  Weight: 45.4 kg 44.2 kg    Examination:  General exam: Alert, awake, oriented x 3 Respiratory system: Bilateral wheezes. Respiratory effort normal. Cardiovascular system:RRR. No murmurs, rubs, gallops. Gastrointestinal system: Abdomen is nondistended, soft and nontender. No organomegaly or masses felt. Normal bowel sounds heard. Central nervous system: Alert and oriented. No focal neurological deficits. Extremities: No C/C/E, +pedal pulses Skin: No rashes, lesions or ulcers Psychiatry: Judgement and insight appear normal. Mood & affect appropriate.    Data Reviewed: I have personally reviewed following labs and imaging studies  CBC: Recent Labs  Lab 09/29/19 2150 09/30/19 0443  WBC 7.7 6.5  HGB 12.4 12.3  HCT 39.5 39.7  MCV 104.5* 105.6*  PLT 263 0000000   Basic Metabolic Panel: Recent Labs  Lab 09/29/19 2150 09/30/19 0443  NA 137 138  K 3.3* 3.4*  CL 96* 96*  CO2 30 28  GLUCOSE 107* 230*  BUN 9 10  CREATININE 0.53 0.49  CALCIUM 8.6* 8.6*   GFR: Estimated Creatinine Clearance: 46.3 mL/min (by C-G formula based on SCr of 0.49 mg/dL). Liver Function Tests: No results for input(s): AST, ALT, ALKPHOS, BILITOT, PROT, ALBUMIN in the last 168 hours. No results for input(s): LIPASE, AMYLASE in the last 168 hours. No results for input(s): AMMONIA in the last 168 hours. Coagulation Profile: No  results for input(s): INR, PROTIME in the last 168 hours. Cardiac Enzymes: No results for input(s): CKTOTAL, CKMB, CKMBINDEX, TROPONINI in the last 168 hours. BNP (last 3 results) No results for input(s): PROBNP in the last 8760 hours. HbA1C: No results for input(s): HGBA1C in the last 72 hours. CBG: Recent Labs  Lab 10/03/19 1730  GLUCAP 188*   Lipid Profile: No results for input(s): CHOL, HDL, LDLCALC, TRIG, CHOLHDL, LDLDIRECT  in the last 72 hours. Thyroid Function Tests: No results for input(s): TSH, T4TOTAL, FREET4, T3FREE, THYROIDAB in the last 72 hours. Anemia Panel: No results for input(s): VITAMINB12, FOLATE, FERRITIN, TIBC, IRON, RETICCTPCT in the last 72 hours. Sepsis Labs: No results for input(s): PROCALCITON, LATICACIDVEN in the last 168 hours.  Recent Results (from the past 240 hour(s))  Respiratory Panel by RT PCR (Flu A&B, Covid) - Nasopharyngeal Swab     Status: None   Collection Time: 09/29/19 11:17 PM   Specimen: Nasopharyngeal Swab  Result Value Ref Range Status   SARS Coronavirus 2 by RT PCR NEGATIVE NEGATIVE Final    Comment: (NOTE) SARS-CoV-2 target nucleic acids are NOT DETECTED. The SARS-CoV-2 RNA is generally detectable in upper respiratoy specimens during the acute phase of infection. The lowest concentration of SARS-CoV-2 viral copies this assay can detect is 131 copies/mL. A negative result does not preclude SARS-Cov-2 infection and should not be used as the sole basis for treatment or other patient management decisions. A negative result may occur with  improper specimen collection/handling, submission of specimen other than nasopharyngeal swab, presence of viral mutation(s) within the areas targeted by this assay, and inadequate number of viral copies (<131 copies/mL). A negative result must be combined with clinical observations, patient history, and epidemiological information. The expected result is Negative. Fact Sheet for Patients:  PinkCheek.be Fact Sheet for Healthcare Providers:  GravelBags.it This test is not yet ap proved or cleared by the [place] FDA and  has been authorized for detection and/or diagnosis of SARS-CoV-2 by FDA under an Emergency Use Authorization (EUA). This EUA will remain  in effect (meaning this test can be used) for the duration of the COVID-19 declaration under Section 564(b)(1) of  the Act, 21 U.S.C. section 360bbb-3(b)(1), unless the authorization is terminated or revoked sooner.    Influenza A by PCR NEGATIVE NEGATIVE Final   Influenza B by PCR NEGATIVE NEGATIVE Final    Comment: (NOTE) The Xpert Xpress SARS-CoV-2/FLU/RSV assay is intended as an aid in  the diagnosis of influenza from Nasopharyngeal swab specimens and  should not be used as a sole basis for treatment. Nasal washings and  aspirates are unacceptable for Xpert Xpress SARS-CoV-2/FLU/RSV  testing. Fact Sheet for Patients: PinkCheek.be Fact Sheet for Healthcare Providers: GravelBags.it This test is not yet approved or cleared by the [place] FDA and  has been authorized for detection and/or diagnosis of SARS-CoV-2 by  FDA under an Emergency Use Authorization (EUA). This EUA will remain  in effect (meaning this test can be used) for the duration of the  Covid-19 declaration under Section 564(b)(1) of the Act, 21  U.S.C. section 360bbb-3(b)(1), unless the authorization is  terminated or revoked. Performed at [facility], [address], [place], [place] [address]          Radiology Studies: No results found.      Scheduled Meds: . budesonide (PULMICORT) nebulizer solution  0.25 mg Nebulization BID  . dextromethorphan-guaiFENesin  1 tablet Oral BID  . doxycycline  100 mg Oral Q12H  .  enoxaparin (LOVENOX) injection  30 mg Subcutaneous Q24H  . feeding supplement (ENSURE ENLIVE)  237 mL Oral BID BM  . FLUoxetine  40 mg Oral Daily  . hydrochlorothiazide  12.5 mg Oral Daily  . insulin aspart  0-20 Units Subcutaneous TID WC  . ipratropium-albuterol  3 mL Nebulization TID  . lisinopril  10 mg Oral Daily  . multivitamin with minerals  1 tablet Oral Daily  . pantoprazole  40 mg Oral Daily  . predniSONE  40 mg Oral Q breakfast  . pregabalin  200 mg Oral BID  . traZODone  50 mg Oral QHS   Continuous Infusions:   LOS: 3 days     Time spent: 46mins    [name], MD [facility]   If 7PM-7AM, please contact night-coverage [email or web address]  10/03/2019, 6:35 PM

## 2019-10-03 NOTE — Progress Notes (Signed)
Smell of cigarette smoke noted by staff in hallway outside patient's room.  Smell stronger upon entering the room.  Patient denies smoking.  Stated, "my sweater stinks." Educated on dangers of smoking with oxygen.  Patient surrender cigarettes which are now located in medication drawer designated for this room.

## 2019-10-03 NOTE — Progress Notes (Signed)
PT Cancellation Note  Patient Details Name: Amy Rubio MRN: [id number] DOB: 03-Dec-1950   Cancelled Treatment:    Reason Eval/Treat Not Completed: Pain limiting ability to participate;Other (comment)(c/o of SOB) Attempted PT session.  Pt c/o SOB though currently on 2L O2 assistance via nasal canal with O2 saturation at 94%.  Pt also c/o generalized pain, scale 8/10.   RN aware of pain.  [address] LPTA; [place] Aldona Lento 10/03/2019, 1:27 PM

## 2019-10-03 NOTE — Progress Notes (Signed)
Patient refused fingerstick for blood glucose level.

## 2019-10-03 NOTE — Progress Notes (Signed)
SATURATION QUALIFICATIONS: (This note is used to comply with regulatory documentation for home oxygen)  Patient Saturations on Room Air at Rest = 91%  Patient Saturations on Room Air while Ambulating = 90%  Patient Saturations on 0 Liters of oxygen while Ambulating = 90%  Please briefly explain why patient needs home oxygen:

## 2019-10-03 NOTE — Progress Notes (Signed)
Patient refused fingerstick for blood glucose level. Education given.

## 2019-10-04 DIAGNOSIS — J9601 Acute respiratory failure with hypoxia: Secondary | ICD-10-CM

## 2019-10-04 LAB — CBC
HCT: 35.8 % — ABNORMAL LOW (ref 36.0–46.0)
Hemoglobin: 10.9 g/dL — ABNORMAL LOW (ref 12.0–15.0)
MCH: 32.7 pg (ref 26.0–34.0)
MCHC: 30.4 g/dL (ref 30.0–36.0)
MCV: 107.5 fL — ABNORMAL HIGH (ref 80.0–100.0)
Platelets: 231 10*3/uL (ref 150–400)
RBC: 3.33 MIL/uL — ABNORMAL LOW (ref 3.87–5.11)
RDW: 14.4 % (ref 11.5–15.5)
WBC: 5 10*3/uL (ref 4.0–10.5)
nRBC: 0 % (ref 0.0–0.2)

## 2019-10-04 LAB — BASIC METABOLIC PANEL
Anion gap: 10 (ref 5–15)
BUN: 16 mg/dL (ref 8–23)
CO2: 35 mmol/L — ABNORMAL HIGH (ref 22–32)
Calcium: 8.9 mg/dL (ref 8.9–10.3)
Chloride: 94 mmol/L — ABNORMAL LOW (ref 98–111)
Creatinine, Ser: 0.54 mg/dL (ref 0.44–1.00)
GFR calc Af Amer: >60 mL/min (ref >60)
GFR calc non Af Amer: >60 mL/min (ref >60)
Glucose, Bld: 87 mg/dL (ref 70–99)
Potassium: 3.5 mmol/L (ref 3.5–5.1)
Sodium: 139 mmol/L (ref 135–145)

## 2019-10-04 NOTE — Progress Notes (Signed)
Subjective: She feels somewhat better but is not quite sure if she is back to her normal self.  She tells me that she does not take any oxygen at home.  Here, she is requiring 2 L/min.   Objective: Vital signs in last 24 hours: Temp:  [97.8 F (36.6 C)] 97.8 F (36.6 C) (01/02 0642) Pulse Rate:  [62-71] 62 (01/02 0642) Resp:  [14-18] 14 (01/02 0642) BP: (102-133)/(59-65) 102/62 (01/02 0642) SpO2:  [85 %-96 %] 96 % (01/02 0710) She looks systemically well and is not toxic or septic clinically.  Respiratory rate is not increased.  However, lung fields show widespread wheezing which is moderately tight.  There are no crackles or bronchial breathing.  She is alert and orientated.  She is afebrile. Intake/Output from previous day: 01/01 0701 - 01/02 0700 In: 240 [P.O.:240] Out: 900 [Urine:900] Intake/Output this shift: No intake/output data recorded.  Recent Labs    10/04/19 0517  HGB 10.9*   Recent Labs    10/04/19 0517  WBC 5.0  RBC 3.33*  HCT 35.8*  PLT 231   Recent Labs    10/04/19 0517  NA 139  K 3.5  CL 94*  CO2 35*  BUN 16  CREATININE 0.54  GLUCOSE 87  CALCIUM 8.9     Assessment/Plan: 1.  COPD exacerbation.  Continue with oral steroids and antibiotics for the present time. 2.  Hyperglycemia, related to steroids.  Patient does not wish fingersticks, will reduce the frequency. 3.  If she is doing better, consider discharge tomorrow.   Shashank Kwasnik C Leisel Pinette 10/04/2019, 10:21 AM

## 2019-10-04 NOTE — Progress Notes (Signed)
Patient is refusing all CBG checks, unable to give patient sliding scale insulin, notified Dr. Hurshel Party.

## 2019-10-05 MED ORDER — METHYLPREDNISOLONE SODIUM SUCC 125 MG IJ SOLR
125.0000 mg | Freq: Two times a day (BID) | INTRAMUSCULAR | Status: AC
  Administered 2019-10-05 (×2): 125 mg via INTRAVENOUS
  Filled 2019-10-05: qty 2

## 2019-10-05 NOTE — Progress Notes (Signed)
Subjective: She says she feels somewhat better but still not back to her normal self.  Saturation on 2 L oxygen is down to 85% according to the respiratory technician at the bedside.   Objective: Vital signs in last 24 hours: Temp:  [97.9 F (36.6 C)-98.4 F (36.9 C)] 98.4 F (36.9 C) (01/03 0616) Pulse Rate:  [59-67] 59 (01/03 0616) Resp:  [17-18] 17 (01/03 0616) BP: (117-138)/(47-63) 117/47 (01/03 0616) SpO2:  [94 %-100 %] 94 % (01/03 0616) She does not appear to be in respiratory distress at rest but she does have still widespread wheezing which is moderately tight.  She is alert and orientated. Intake/Output from previous day: 01/02 0701 - 01/03 0700 In: -  Out: 600 [Urine:600] Intake/Output this shift: Total I/O In: 240 [P.O.:240] Out: -   Recent Labs    10/04/19 0517  HGB 10.9*   Recent Labs    10/04/19 0517  WBC 5.0  RBC 3.33*  HCT 35.8*  PLT 231   Recent Labs    10/04/19 0517  NA 139  K 3.5  CL 94*  CO2 35*  BUN 16  CREATININE 0.54  GLUCOSE 87  CALCIUM 8.9     Assessment/Plan: 1.  COPD exacerbation.  I will give her a couple of doses of intravenous Solu-Medrol to see if this will help her. 2.  Hopefully, possible discharge tomorrow.  She will likely need home oxygen I suspect.   Ramello Cordial C Kody Brandl 10/05/2019, 10:02 AM

## 2019-10-06 MED ORDER — PREDNISONE 20 MG PO TABS
40.0000 mg | ORAL_TABLET | Freq: Once | ORAL | Status: AC
  Administered 2019-10-06: 40 mg via ORAL
  Filled 2019-10-06: qty 2

## 2019-10-06 MED ORDER — OXYCODONE-ACETAMINOPHEN 7.5-325 MG PO TABS
1.0000 | ORAL_TABLET | Freq: Once | ORAL | Status: AC
  Administered 2019-10-06: 1 via ORAL
  Filled 2019-10-06: qty 1

## 2019-10-06 MED ORDER — ADULT MULTIVITAMIN W/MINERALS CH: 1.0000 | ORAL_TABLET | Freq: Every day | ORAL | Status: AC

## 2019-10-06 MED ORDER — PREDNISONE 10 MG PO TABS: ORAL_TABLET | ORAL | 0 refills | Status: AC

## 2019-10-06 NOTE — TOC Transition Note (Signed)
Transition of Care [facility]) - CM/SW Discharge Note   Patient Details  Name: Amy Rubio MRN: [id number] Date of Birth: 11-02-1950  [facility] CM/SW Contact:  Sherald Barge, RN Phone Number: 10/06/2019, 2:12 PM   Clinical Narrative:   [place] DC today. Pt will need home O2. Pt has chosen [employer] for DME needs. Referral sent and port O2 tank will be delivered to pt room prior to DC. Pt has refused HH during this admission.  Final next level of care: Home/Self Care     Patient Goals and CMS Choice Patient states their goals for this hospitalization and ongoing recovery are:: feel better CMS Medicare.gov Compare Post Acute Care list provided to:: Patient Choice offered to / list presented to : Patient  Discharge Placement                       Discharge Plan and Services                DME Arranged: Oxygen DME Agency: [facility] Date DME Agency Contacted: 10/06/19 Time DME Agency Contacted: [phone]              Social Determinants of Health ([place]) Interventions     Readmission Risk Interventions Readmission Risk Prevention Plan 10/06/2019  Transportation Screening Complete  PCP or Specialist Appt within 5-7 Days Complete  Home Care Screening Complete  Medication Review (RN CM) Complete  Some recent data might be hidden

## 2019-10-06 NOTE — Discharge Summary (Signed)
Physician Discharge Summary  Amy Rubio [id number] DOB: May 29, 1951 DOA: 09/29/2019  PCP: Redmond School, MD  Admit date: 09/29/2019 Discharge date: 10/06/2019  Recommendations for Outpatient Follow-up:  1. Ongoing treatment for COPD 2. Recommend continue to encourage abstinence from smoking  Follow-up Information    Redmond School, MD Follow up on 10/10/2019.   Specialty: Internal Medicine Why: Be there at 1pm: appointment will begin at 1:30pm Contact information: [address] [place] [id number] [phone]            Discharge Diagnoses: Principal diagnosis is #1 1. Acute hypoxic respiratory failure secondary to COPD exacerbation 2. Essential hypertension 3. Severe malnutrition  Discharge Condition: improved Disposition: home  Diet recommendation: regular  Filed Weights   09/29/19 2032 09/30/19 0230  Weight: 45.4 kg 44.2 kg    History of present illness:  69 year old woman PMH COPD, polio with right lower extremity weakness, not on oxygen presented with progressive shortness of breath and hypoxia to the 70s at home.  Admitted for COPD exacerbation.  Hospital Course:  Patient was treated for COPD exacerbation with only very gradual clinical improvement.  Hypoxia did not resolve and she required home oxygen on discharge. Evaluated by therapy.  No OT needed.  Home health PT recommended--patient declined this offer.  Significant Diagnostic Tests:  . Chest x-ray COPD.  Today's assessment: S: Breathing better, feels better.  Ready to go home. O: Vitals:  Vitals:   10/06/19 0758 10/06/19 1410  BP:    Pulse:    Resp:    Temp:    SpO2: 91% 92%    Constitutional:  . Appears calm and comfortable Respiratory:  . Bilateral wheezes.  No rhonchi or rales.  Able to speak in full sentences.  Respiratory effort appears minimally increased. Cardiovascular:  . RRR, no m/r/g . No LE extremity edema   Psychiatric:  . Mental status o Mood, affect  appropriate  No new labs  Discharge Instructions  Discharge Instructions    Diet general   Complete by: As directed    Discharge instructions   Complete by: As directed    Call your physician or seek immediate medical attention for shortness of breath, fever, or worsening of condition.     Allergies as of 10/06/2019   No Known Allergies     Medication List    TAKE these medications   albuterol 108 (90 Base) MCG/ACT inhaler Commonly known as: Ventolin HFA INHALE 2 PUFFS INTO THE LUNGS EVERY 4 HOURS AS NEEDED FOR WHEEZING ORSHORTNESS OF BREATH. What changed:   how much to take  how to take this  when to take this  reasons to take this   albuterol (2.5 MG/3ML) 0.083% nebulizer solution Commonly known as: PROVENTIL Take 3 mLs (2.5 mg total) by nebulization every 4 (four) hours as needed for wheezing or shortness of breath. What changed: when to take this   feeding supplement (ENSURE ENLIVE) Liqd Take 237 mLs by mouth 2 (two) times daily between meals.   FLUoxetine 40 MG capsule Commonly known as: PROZAC Take 40 mg by mouth daily.   guaiFENesin 600 MG 12 hr tablet Commonly known as: MUCINEX Take 1 tablet (600 mg total) by mouth 2 (two) times daily. What changed: when to take this   lisinopril-hydrochlorothiazide 10-12.5 MG tablet Commonly known as: ZESTORETIC Take 1 tablet by mouth daily.   multivitamin with minerals Tabs tablet Take 1 tablet by mouth daily. Start taking on: October 07, 2019   omeprazole 20 MG capsule Commonly  known as: PRILOSEC Take 1 capsule (20 mg total) by mouth daily. What changed:   when to take this  reasons to take this   oxyCODONE-acetaminophen 7.5-325 MG tablet Commonly known as: PERCOCET Take 1 tablet by mouth 4 (four) times daily as needed.   predniSONE 10 MG tablet Commonly known as: DELTASONE Take 4 tablets (40 mg total) by mouth daily for 4 days, THEN 2 tablets (20 mg total) daily for 4 days, THEN 1 tablet (10 mg total)  daily for 4 days. Start taking on: October 06, 2019   pregabalin 200 MG capsule Commonly known as: LYRICA Take 200 mg by mouth 2 (two) times daily.   traZODone 50 MG tablet Commonly known as: DESYREL Take 1 tablet (50 mg total) by mouth at bedtime. What changed:   when to take this  reasons to take this   Trelegy Ellipta 100-62.5-25 MCG/INH Aepb Generic drug: Fluticasone-Umeclidin-Vilant Take 1 puff by mouth daily.            Durable Medical Equipment  (From admission, onward)         Start     Ordered   10/06/19 1442  For home use only DME Shower stool  Once     10/06/19 1441   10/06/19 1209  For home use only DME oxygen  Once    Question Answer Comment  Length of Need Lifetime   Mode or (Route) Nasal cannula   Liters per Minute 2   Frequency Continuous (stationary and portable oxygen unit needed)   Oxygen delivery system Gas      10/06/19 1209         No Known Allergies  The results of significant diagnostics from this hospitalization (including imaging, microbiology, ancillary and laboratory) are listed below for reference.    Significant Diagnostic Studies: DG Chest Port 1 View  Result Date: 09/29/2019 CLINICAL DATA:  Cough and short of breath EXAM: PORTABLE CHEST 1 VIEW COMPARISON:  08/25/2019, 03/21/2019, 07/15/2017 FINDINGS: Hyperinflation with emphysematous disease. Small focus of airspace disease at the left base. Stable cardiomediastinal silhouette. Probable nipple shadow right lung base. Aortic atherosclerosis. No pneumothorax. IMPRESSION: Hyperinflation with emphysematous disease. Small focus of airspace disease at the left base may reflect early pneumonia Electronically Signed   By: Donavan Foil M.D.   On: 09/29/2019 23:16    Microbiology: Recent Results (from the past 240 hour(s))  Respiratory Panel by RT PCR (Flu A&B, Covid) - Nasopharyngeal Swab     Status: None   Collection Time: 09/29/19 11:17 PM   Specimen: Nasopharyngeal Swab  Result  Value Ref Range Status   SARS Coronavirus 2 by RT PCR NEGATIVE NEGATIVE Final    Comment: (NOTE) SARS-CoV-2 target nucleic acids are NOT DETECTED. The SARS-CoV-2 RNA is generally detectable in upper respiratoy specimens during the acute phase of infection. The lowest concentration of SARS-CoV-2 viral copies this assay can detect is 131 copies/mL. A negative result does not preclude SARS-Cov-2 infection and should not be used as the sole basis for treatment or other patient management decisions. A negative result may occur with  improper specimen collection/handling, submission of specimen other than nasopharyngeal swab, presence of viral mutation(s) within the areas targeted by this assay, and inadequate number of viral copies (<131 copies/mL). A negative result must be combined with clinical observations, patient history, and epidemiological information. The expected result is Negative. Fact Sheet for Patients:  PinkCheek.be Fact Sheet for Healthcare Providers:  GravelBags.it This test is not yet ap proved or cleared  by the [place] and  has been authorized for detection and/or diagnosis of SARS-CoV-2 by FDA under an Emergency Use Authorization (EUA). This EUA will remain  in effect (meaning this test can be used) for the duration of the COVID-19 declaration under Section 564(b)(1) of the Act, 21 U.S.C. section 360bbb-3(b)(1), unless the authorization is terminated or revoked sooner.    Influenza A by PCR NEGATIVE NEGATIVE Final   Influenza B by PCR NEGATIVE NEGATIVE Final    Comment: (NOTE) The Xpert Xpress SARS-CoV-2/FLU/RSV assay is intended as an aid in  the diagnosis of influenza from Nasopharyngeal swab specimens and  should not be used as a sole basis for treatment. Nasal washings and  aspirates are unacceptable for Xpert Xpress SARS-CoV-2/FLU/RSV  testing. Fact Sheet for Patients:  PinkCheek.be Fact Sheet for Healthcare Providers: GravelBags.it This test is not yet approved or cleared by the [place] FDA and  has been authorized for detection and/or diagnosis of SARS-CoV-2 by  FDA under an Emergency Use Authorization (EUA). This EUA will remain  in effect (meaning this test can be used) for the duration of the  Covid-19 declaration under Section 564(b)(1) of the Act, 21  U.S.C. section 360bbb-3(b)(1), unless the authorization is  terminated or revoked. Performed at [facility], [address] [place], [place] [address]      Labs: Basic Metabolic Panel: Recent Labs  Lab 09/29/19 2150 09/30/19 0443 10/04/19 0517  NA 137 138 139  K 3.3* 3.4* 3.5  CL 96* 96* 94*  CO2 30 28 35*  GLUCOSE 107* 230* 87  BUN 9 10 16   CREATININE 0.53 0.49 0.54  CALCIUM 8.6* 8.6* 8.9   CBC: Recent Labs  Lab 09/29/19 2150 09/30/19 0443 10/04/19 0517  WBC 7.7 6.5 5.0  HGB 12.4 12.3 10.9*  HCT 39.5 39.7 35.8*  MCV 104.5* 105.6* 107.5*  PLT 263 243 231   CBG: Recent Labs  Lab 10/03/19 1730  GLUCAP 188*    Principal Problem:   COPD exacerbation ([place]) Active Problems:   Essential hypertension, benign   Hyperlipidemia   Hypokalemia   Protein-calorie malnutrition, severe   Acute respiratory distress   Time coordinating discharge: 35 minutes  Signed:  Murray Hodgkins, MD  [facility]  10/06/2019, 8:26 PM

## 2019-10-06 NOTE — Progress Notes (Signed)
Patient Information  Patient Name  Amy Rubio, Amy Rubio ([id number]) Legal Sex  Female DOB  12/12/1950  Room Bed  A327 A327-01  Patient Demographics  Address  [address]  [place] [address] Phone  [phone] (Home)  [phone] (Mobile) *Preferred*  Patient Ethnicity & Race  Ethnic Group Patient Race  Not Hispanic or Latino White or Caucasian  Emergency Contact(s)  Name Relation Home Work Mobile  Amy Rubio Sister [phone]  [phone]  Amy Rubio   [phone]  [facility] Other   [phone]  Amy Rubio Daughter   [phone]  Documents on File   Status Date Received Description  Documents for the Patient  EMR Medication Summary Not Received    EMR Problem Summary Not Received    EMR Patient Summary Not Received    Historic Radiology Documentation Not Received    [place] Not Received    [place] E-Signature HIPAA Notice of Privacy Received 08/15/11   [place] E-Signature HIPAA Notice of Privacy Spanish Unable to Obtain [id number]   Driver's License Received [id number]   Insurance Card Received 07/19/17 [facility] ADMIT SHEET 07/18/17  Advance Directives/Living Will/HCPOA/POA Not Received    Financial Application Not Received    Driver's License Received [id number] 04/2018  HIM ROI Authorization Not Received    Advanced Beneficiary Notice (ABN) Not Received    Insurance Card Received 04/24/13 RFM Medicare  [name] E-Signature HIPAA Notice of Privacy Received 04/24/13   Insurance Card     E-Signature AOB Spanish Not Received    Other Photo ID Not Received    AMB Intake Forms/Questionnaires  02/24/15   AMB Correspondence  03/06/15 ANTICIPATED MEDICARE DENIAL [place] [facility]  AMB Correspondence  04/21/15 LETTER [place] [facility]  HIM ROI Authorization (Expired) 12/14/15 RFM  Release of Information Not Received    HIM ROI Authorization (Expired) 05/23/17 RFM   AMB Correspondence  05/30/17 LETTER [place] [facility]  Insurance Card Received 06/27/17   AMB Correspondence  06/21/17 SOAP NOTE TEOH MD PA, S  Release of Information Received 08/08/17 ROSM/DPR for all [facility]  AMB Outside Consult Note Received 08/01/17   HIM ROI Authorization  08/21/17 Automated Records Collection  HIM ROI Authorization  08/21/17 Automated Records Collection  HIM ROI Authorization (Expired) 08/29/17 RFM  AMB HH/NH/Hospice Received [id number] CERTIFICATION/POC [facility]  HIM ROI Authorization (Expired) 10/19/17 moving records to new doctor  AMB HH/NH/Hospice Received 09/13/17 ORDER [facility]  Release of Information Received 10/26/17   AMB HH/NH/Hospice Received 10/31/17 ORDER [place]  AMB Correspondence Received 10/10/17 [facility]  AMB HH/NH/Hospice Received [id number] HOME HEALTH CERTIFICATION&POC [place] Authorization  01/30/18   AMB HH/NH/Hospice Received 12/25/17 ORDER [place] Card Received 03/19/18 MEDICARE  Release of Information   RGA  HIM ROI Authorization  01/31/18   AMB Correspondence Received 12/03/17 REFERRAL HALL MD, J  AMB HH/NH/Hospice Received 01/22/18 EPISODE SUMMARY REPORT [facility]  HIM ROI Authorization  02/27/18   Release of Information Received 04/06/18   Release of Information Received 04/19/18   AMB HH/NH/Hospice Received [id number] CERTIFICATION/POC [facility] [place]  AMB HH/NH/Hospice Received 123456 CERTIFICATION/POC [facility]  AMB Correspondence Received 11/06/18   AMB HH/NH/Hospice Received 01/06/19 STANDARD WRITTEN ORDER [facility] AND OR   HIPAA NOTICE OF PRIVACY - Scanned Received 03/19/19 unable to enter room due to national emergency  Insurance Card Not Received (Deleted)  Patient Photo   Photo of Patient  AMB Correspondence (Deleted) 06/05/17 LETTER [place] [facility]  HIM Release of Information Output  08/21/17 Entire Encounter  HIM Release of Information Output  08/21/17 Entire Encounter  HIM Release of Information Output (Deleted) 08/29/17 Notes Individual, Orders/Results Individual  HIM Release of Information Output (Deleted) 08/29/17 Notes Individual, Orders/Results Individual  HIM Release of Information Output (Deleted) 10/19/17 Entire Encounter  HIM Release of Information Output  01/30/18 Requested records  HIM Release of Information Output (Deleted) 01/31/18 Requested records  HIM Release of Information Output (Deleted) 02/27/18 Requested records  Documents for the Encounter  AOB (Assignment of Insurance Benefits) Received 09/29/19 Obtained verbal consent due to national emergency  E-signature AOB     MEDICARE RIGHTS Received 09/29/19 Obtained verbal consent due to national emergency  E-signature Medicare Rights     ED Patient Billing Extract   ED PB Billing Extract  Cardiac Monitoring Strip Received 09/30/19   Cardiac Monitoring Strip Shift Summary Received 09/30/19   Medicare Observation  09/30/19   EKG Received 09/30/19   Admission Information  Current Information  Attending Provider Admitting Provider Admission Type Admission Status  Amy Cota, MD Amy Dike, MD Emergency Admission (Confirmed)       Admission Date/Time Discharge Date Hospital Service Auth/Cert Status  [id number] 10:24 PM  [place] Unit Room/Bed   [facility] AP-DEPT 300 A327/A327-01        Admission  Complaint  shortness of breath  Hospital Account  Name Acct ID Class Status Primary Coverage  Amy Rubio [id number] Inpatient Open MEDICARE - MEDICARE PART A AND B      [name] Account (for Hospital Account [id number])  Name Relation to [place]? Acct Type  Amy Rubio Self [facility]SA Yes Personal/Family  Address Phone    [address]  [place],  [address]  [phone])        Coverage Information (for Hospital Account [id number])  F/O Payor/Plan Precert #  MEDICARE/MEDICARE PART A AND B   Subscriber Subscriber #  Amy Rubio [id number]  Address Phone  [id number]  [place], [place] [address]        Care Everywhere ID:  [id number]

## 2019-10-06 NOTE — Progress Notes (Signed)
Physical Therapy Treatment Patient Details Name: Amy Rubio MRN: [id number] DOB: 01-17-1951 Today's Date: 10/06/2019    History of Present Illness Amy Rubio is a 69 y.o. 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  She mentions that her O2 sats have been in the 70s at home.  She has been coughing frequently, mostly a dry cough, but occasionally productive of yellow sputum.  Denies hemoptysis.  She denies fever, chills, rhinorrhea or sore throat.  Positive pleuritic chest pain and palpitations from bronchodilators, but denies dizziness, diaphoresis, PND, orthopnea or pitting edema of the lower extremities.  No abdominal pain, nausea, vomiting, diarrhea, constipation, melena or hematochezia.  No dysuria, frequency or hematuria.  Denies polyuria, polydipsia, polyphagia or blurred vision.    PT Comments    Pt coughing upon arrival, on 2LPM O2 with nasal cannula shifted so 1 piece inserted into nose and O2 sat 86%. Pt educated on pursed lip breathing and nasal cannula repositioned correctly and O2 sat rebounded to 91%. Pt tolerates therapeutic exercises with O2 sat decrease to 89% once during STS reps, rebounding to 92% with rest and pursed lip breathing. Pt not wanting to ambulate during therapy due to generalized full body pain, but is motivated and hopeful to d/c home soon. Educated pt on monitoring O2 sat with personal pulse ox and pt verbalized understanding.  Patient will benefit from continued physical therapy in hospital and recommended venue below to increase strength, balance,  endurance for safe ADLs and gait.    Follow Up Recommendations  Home health PT     Equipment Recommendations  None recommended by PT    Recommendations for Other Services       Precautions / Restrictions Precautions Precautions: Fall Restrictions Weight Bearing Restrictions: No    Mobility  Bed Mobility Overal bed mobility: Modified Independent             General bed mobility comments: use of  bedrail to upright trunk  Transfers Overall transfer level: Modified independent Equipment used: Straight cane      General transfer comment: verbal cues to encourage single UE to assist in rising from surface  Ambulation/Gait      General Gait Details: declined "let's not today"   Stairs        Wheelchair Mobility    Modified Rankin (Stroke Patients Only)       Balance Overall balance assessment: Needs assistance Sitting-balance support: Feet supported;No upper extremity supported Sitting balance-Leahy Scale: Good Sitting balance - Comments: seated EOB   Standing balance support: Single extremity supported Standing balance-Leahy Scale: Fair Standing balance comment: with SPC         Cognition Arousal/Alertness: Awake/alert Behavior During Therapy: WFL for tasks assessed/performed Overall Cognitive Status: Within Functional Limits for tasks assessed              Exercises General Exercises - Lower Extremity Long Arc Quad: Seated;Both;10 reps Hip Flexion/Marching: Seated;Both;10 reps Mini-Sqauts: 15 reps(STS with SPC from EOB)    General Comments        Pertinent Vitals/Pain Pain Assessment: 0-10 Pain Score: 5  Pain Location: "I hurt all over" Pain Descriptors / Indicators: Aching(generalized pain all ober) Pain  Intervention(s): Limited activity within patient's tolerance;Monitored during session    Home Living                      Prior Function            PT Goals (current goals can now be found in the care plan  section) Acute Rehab PT Goals Patient Stated Goal: return home and breathe better PT Goal Formulation: With patient Time For Goal Achievement: 10/14/19 Potential to Achieve Goals: Good Progress towards PT goals: Progressing toward goals    Frequency    Min 3X/week      PT Plan Current plan remains appropriate    Co-evaluation              AM-PAC PT "6 Clicks" Mobility   Outcome Measure  Help needed turning from your back to your side while in a flat bed without using bedrails?: None Help needed moving from lying on your back to sitting on the side of a flat bed without using bedrails?: None Help needed moving to and from a bed to a chair (including a wheelchair)?: A Little Help needed standing up from a chair using your arms (e.g., wheelchair or bedside chair)?: None Help needed to walk in hospital room?: A Little Help needed climbing 3-5 steps with a railing? : A Little 6 Click Score: 21    End of Session Equipment Utilized During Treatment: Oxygen(2LPM) Activity Tolerance: Patient tolerated treatment well;Patient limited by fatigue Patient left: in bed;with call bell/phone within reach;with family/visitor present Nurse Communication: Mobility status PT Visit Diagnosis: Other abnormalities of gait and mobility (R26.89);Unsteadiness on feet (R26.81);Muscle weakness (generalized) (M62.81)     Time: [id number] PT Time Calculation (min) (ACUTE ONLY): 13 min  Charges:  $Therapeutic Exercise: 8-22 mins                      Tori Danely Bayliss PT, DPT 10/06/19, 1:26 PM [phone]

## 2019-10-06 NOTE — Progress Notes (Signed)
SATURATION QUALIFICATIONS: (This note is used to comply with regulatory documentation for home oxygen)  Patient Saturations on Room Air at Rest = 86%  Patient Saturations on Room Air while Ambulating = 86%  Patient Saturations on 2 Liters of oxygen while Ambulating = 92%  Please briefly explain why patient needs home oxygen: Patient unable to maintain oxygen saturations above 90% without O2.

## 2019-10-23 DIAGNOSIS — Z681 Body mass index (BMI) 19 or less, adult: Secondary | ICD-10-CM | POA: Diagnosis not present

## 2019-10-23 DIAGNOSIS — J449 Chronic obstructive pulmonary disease, unspecified: Secondary | ICD-10-CM | POA: Diagnosis not present

## 2019-10-23 DIAGNOSIS — Z0001 Encounter for general adult medical examination with abnormal findings: Secondary | ICD-10-CM | POA: Diagnosis not present

## 2019-10-23 DIAGNOSIS — M1991 Primary osteoarthritis, unspecified site: Secondary | ICD-10-CM | POA: Diagnosis not present

## 2019-10-23 DIAGNOSIS — F320 Major depressive disorder, single episode, mild: Secondary | ICD-10-CM | POA: Diagnosis not present

## 2019-11-27 DIAGNOSIS — M1990 Unspecified osteoarthritis, unspecified site: Secondary | ICD-10-CM | POA: Diagnosis not present

## 2019-11-27 DIAGNOSIS — G894 Chronic pain syndrome: Secondary | ICD-10-CM | POA: Diagnosis not present

## 2019-11-27 DIAGNOSIS — Z79899 Other long term (current) drug therapy: Secondary | ICD-10-CM | POA: Diagnosis not present

## 2019-12-03 DIAGNOSIS — Z23 Encounter for immunization: Secondary | ICD-10-CM | POA: Diagnosis not present

## 2019-12-25 DIAGNOSIS — M1990 Unspecified osteoarthritis, unspecified site: Secondary | ICD-10-CM | POA: Diagnosis not present

## 2019-12-25 DIAGNOSIS — G894 Chronic pain syndrome: Secondary | ICD-10-CM | POA: Diagnosis not present

## 2019-12-25 DIAGNOSIS — Z79899 Other long term (current) drug therapy: Secondary | ICD-10-CM | POA: Diagnosis not present

## 2019-12-25 DIAGNOSIS — Z9189 Other specified personal risk factors, not elsewhere classified: Secondary | ICD-10-CM | POA: Diagnosis not present

## 2019-12-31 DIAGNOSIS — Z23 Encounter for immunization: Secondary | ICD-10-CM | POA: Diagnosis not present

## 2020-01-04 DIAGNOSIS — J449 Chronic obstructive pulmonary disease, unspecified: Secondary | ICD-10-CM | POA: Diagnosis not present

## 2020-01-14 DIAGNOSIS — R0602 Shortness of breath: Secondary | ICD-10-CM | POA: Diagnosis not present

## 2020-01-14 DIAGNOSIS — R062 Wheezing: Secondary | ICD-10-CM | POA: Diagnosis not present

## 2020-01-16 DIAGNOSIS — J9610 Chronic respiratory failure, unspecified whether with hypoxia or hypercapnia: Secondary | ICD-10-CM | POA: Diagnosis not present

## 2020-01-16 DIAGNOSIS — Z681 Body mass index (BMI) 19 or less, adult: Secondary | ICD-10-CM | POA: Diagnosis not present

## 2020-01-16 DIAGNOSIS — J449 Chronic obstructive pulmonary disease, unspecified: Secondary | ICD-10-CM | POA: Diagnosis not present

## 2020-01-16 DIAGNOSIS — Z23 Encounter for immunization: Secondary | ICD-10-CM | POA: Diagnosis not present

## 2020-01-16 DIAGNOSIS — J189 Pneumonia, unspecified organism: Secondary | ICD-10-CM | POA: Diagnosis not present

## 2020-02-03 DIAGNOSIS — J449 Chronic obstructive pulmonary disease, unspecified: Secondary | ICD-10-CM | POA: Diagnosis not present

## 2020-02-04 DIAGNOSIS — G894 Chronic pain syndrome: Secondary | ICD-10-CM | POA: Diagnosis not present

## 2020-02-04 DIAGNOSIS — Z720 Tobacco use: Secondary | ICD-10-CM | POA: Diagnosis not present

## 2020-02-04 DIAGNOSIS — M1990 Unspecified osteoarthritis, unspecified site: Secondary | ICD-10-CM | POA: Diagnosis not present

## 2020-02-04 DIAGNOSIS — R69 Illness, unspecified: Secondary | ICD-10-CM | POA: Diagnosis not present

## 2020-02-04 DIAGNOSIS — Z79899 Other long term (current) drug therapy: Secondary | ICD-10-CM | POA: Diagnosis not present

## 2020-03-01 ENCOUNTER — Other Ambulatory Visit: Payer: Self-pay

## 2020-03-01 ENCOUNTER — Inpatient Hospital Stay (HOSPITAL_COMMUNITY)
Admission: EM | Admit: 2020-03-01 | Discharge: 2020-03-12 | DRG: 190 | Disposition: A | Discharge status: Home / Self Care | Payer: Medicare HMO | Attending: Internal Medicine | Admitting: Internal Medicine

## 2020-03-01 ENCOUNTER — Encounter (HOSPITAL_COMMUNITY): Payer: Self-pay | Admitting: Emergency Medicine

## 2020-03-01 ENCOUNTER — Emergency Department (HOSPITAL_COMMUNITY): Payer: Medicare HMO

## 2020-03-01 DIAGNOSIS — E785 Hyperlipidemia, unspecified: Secondary | ICD-10-CM | POA: Diagnosis present

## 2020-03-01 DIAGNOSIS — M797 Fibromyalgia: Secondary | ICD-10-CM | POA: Diagnosis present

## 2020-03-01 DIAGNOSIS — G894 Chronic pain syndrome: Secondary | ICD-10-CM | POA: Diagnosis present

## 2020-03-01 DIAGNOSIS — I10 Essential (primary) hypertension: Secondary | ICD-10-CM | POA: Diagnosis present

## 2020-03-01 DIAGNOSIS — E43 Unspecified severe protein-calorie malnutrition: Secondary | ICD-10-CM | POA: Diagnosis present

## 2020-03-01 DIAGNOSIS — G14 Postpolio syndrome: Secondary | ICD-10-CM | POA: Diagnosis present

## 2020-03-01 DIAGNOSIS — R062 Wheezing: Secondary | ICD-10-CM | POA: Diagnosis not present

## 2020-03-01 DIAGNOSIS — Z800 Family history of malignant neoplasm of digestive organs: Secondary | ICD-10-CM

## 2020-03-01 DIAGNOSIS — R0603 Acute respiratory distress: Secondary | ICD-10-CM

## 2020-03-01 DIAGNOSIS — J441 Chronic obstructive pulmonary disease with (acute) exacerbation: Secondary | ICD-10-CM | POA: Diagnosis not present

## 2020-03-01 DIAGNOSIS — M810 Age-related osteoporosis without current pathological fracture: Secondary | ICD-10-CM | POA: Diagnosis present

## 2020-03-01 DIAGNOSIS — R0602 Shortness of breath: Secondary | ICD-10-CM | POA: Diagnosis not present

## 2020-03-01 DIAGNOSIS — Z86010 Personal history of colonic polyps: Secondary | ICD-10-CM

## 2020-03-01 DIAGNOSIS — M1990 Unspecified osteoarthritis, unspecified site: Secondary | ICD-10-CM | POA: Diagnosis present

## 2020-03-01 DIAGNOSIS — Z66 Do not resuscitate: Secondary | ICD-10-CM | POA: Diagnosis not present

## 2020-03-01 DIAGNOSIS — F410 Panic disorder [episodic paroxysmal anxiety] without agoraphobia: Secondary | ICD-10-CM | POA: Diagnosis present

## 2020-03-01 DIAGNOSIS — J9622 Acute and chronic respiratory failure with hypercapnia: Secondary | ICD-10-CM | POA: Diagnosis present

## 2020-03-01 DIAGNOSIS — Z8249 Family history of ischemic heart disease and other diseases of the circulatory system: Secondary | ICD-10-CM

## 2020-03-01 DIAGNOSIS — Z79899 Other long term (current) drug therapy: Secondary | ICD-10-CM

## 2020-03-01 DIAGNOSIS — K219 Gastro-esophageal reflux disease without esophagitis: Secondary | ICD-10-CM | POA: Diagnosis present

## 2020-03-01 DIAGNOSIS — Z20822 Contact with and (suspected) exposure to covid-19: Secondary | ICD-10-CM | POA: Diagnosis not present

## 2020-03-01 DIAGNOSIS — Z7189 Other specified counseling: Secondary | ICD-10-CM

## 2020-03-01 DIAGNOSIS — Z9981 Dependence on supplemental oxygen: Secondary | ICD-10-CM

## 2020-03-01 DIAGNOSIS — Z8612 Personal history of poliomyelitis: Secondary | ICD-10-CM

## 2020-03-01 DIAGNOSIS — B370 Candidal stomatitis: Secondary | ICD-10-CM | POA: Diagnosis not present

## 2020-03-01 DIAGNOSIS — F329 Major depressive disorder, single episode, unspecified: Secondary | ICD-10-CM | POA: Diagnosis present

## 2020-03-01 DIAGNOSIS — Z515 Encounter for palliative care: Secondary | ICD-10-CM

## 2020-03-01 DIAGNOSIS — Z801 Family history of malignant neoplasm of trachea, bronchus and lung: Secondary | ICD-10-CM

## 2020-03-01 DIAGNOSIS — Z681 Body mass index (BMI) 19 or less, adult: Secondary | ICD-10-CM

## 2020-03-01 DIAGNOSIS — R64 Cachexia: Secondary | ICD-10-CM | POA: Diagnosis present

## 2020-03-01 DIAGNOSIS — R5383 Other fatigue: Secondary | ICD-10-CM | POA: Diagnosis not present

## 2020-03-01 DIAGNOSIS — F17210 Nicotine dependence, cigarettes, uncomplicated: Secondary | ICD-10-CM | POA: Diagnosis present

## 2020-03-01 DIAGNOSIS — J9621 Acute and chronic respiratory failure with hypoxia: Secondary | ICD-10-CM | POA: Diagnosis present

## 2020-03-01 DIAGNOSIS — J383 Other diseases of vocal cords: Secondary | ICD-10-CM | POA: Diagnosis present

## 2020-03-01 DIAGNOSIS — K222 Esophageal obstruction: Secondary | ICD-10-CM | POA: Diagnosis present

## 2020-03-01 LAB — SARS CORONAVIRUS 2 BY RT PCR (HOSPITAL ORDER, PERFORMED IN ~~LOC~~ HOSPITAL LAB): SARS Coronavirus 2: NEGATIVE

## 2020-03-01 LAB — CBC WITH DIFFERENTIAL/PLATELET
Abs Immature Granulocytes: 0.01 10*3/uL (ref 0.00–0.07)
Basophils Absolute: 0 10*3/uL (ref 0.0–0.1)
Basophils Relative: 1 %
Eosinophils Absolute: 0.1 10*3/uL (ref 0.0–0.5)
Eosinophils Relative: 3 %
HCT: 37.9 % (ref 36.0–46.0)
Hemoglobin: 12 g/dL (ref 12.0–15.0)
Immature Granulocytes: 0 %
Lymphocytes Relative: 23 %
Lymphs Abs: 0.9 10*3/uL (ref 0.7–4.0)
MCH: 32.7 pg (ref 26.0–34.0)
MCHC: 31.7 g/dL (ref 30.0–36.0)
MCV: 103.3 fL — ABNORMAL HIGH (ref 80.0–100.0)
Monocytes Absolute: 0.4 10*3/uL (ref 0.1–1.0)
Monocytes Relative: 9 %
Neutro Abs: 2.6 10*3/uL (ref 1.7–7.7)
Neutrophils Relative %: 64 %
Platelets: 266 10*3/uL (ref 150–400)
RBC: 3.67 MIL/uL — ABNORMAL LOW (ref 3.87–5.11)
RDW: 14.7 % (ref 11.5–15.5)
WBC: 4 10*3/uL (ref 4.0–10.5)
nRBC: 0 % (ref 0.0–0.2)

## 2020-03-01 LAB — POC SARS CORONAVIRUS 2 AG -  ED
SARS Coronavirus 2 Ag: NEGATIVE
SARS Coronavirus 2 Ag: NEGATIVE

## 2020-03-01 LAB — BASIC METABOLIC PANEL
Anion gap: 11 (ref 5–15)
BUN: 15 mg/dL (ref 8–23)
CO2: 30 mmol/L (ref 22–32)
Calcium: 9 mg/dL (ref 8.9–10.3)
Chloride: 92 mmol/L — ABNORMAL LOW (ref 98–111)
Creatinine, Ser: 0.49 mg/dL (ref 0.44–1.00)
GFR calc Af Amer: >60 mL/min (ref >60)
GFR calc non Af Amer: >60 mL/min (ref >60)
Glucose, Bld: 101 mg/dL — ABNORMAL HIGH (ref 70–99)
Potassium: 3.6 mmol/L (ref 3.5–5.1)
Sodium: 133 mmol/L — ABNORMAL LOW (ref 135–145)

## 2020-03-01 MED ORDER — METHYLPREDNISOLONE SODIUM SUCC 125 MG IJ SOLR
125.0000 mg | Freq: Once | INTRAMUSCULAR | Status: AC
  Administered 2020-03-01: 125 mg via INTRAVENOUS
  Filled 2020-03-01: qty 2

## 2020-03-01 MED ORDER — LISINOPRIL-HYDROCHLOROTHIAZIDE 10-12.5 MG PO TABS: 1.0000 | ORAL_TABLET | Freq: Every day | ORAL | Status: DC

## 2020-03-01 MED ORDER — IPRATROPIUM-ALBUTEROL 20-100 MCG/ACT IN AERS: 2.0000 | INHALATION_SPRAY | Freq: Four times a day (QID) | RESPIRATORY_TRACT | Status: DC

## 2020-03-01 MED ORDER — ALBUTEROL SULFATE (2.5 MG/3ML) 0.083% IN NEBU: 2.5000 mg | INHALATION_SOLUTION | Freq: Four times a day (QID) | RESPIRATORY_TRACT | Status: DC

## 2020-03-01 MED ORDER — ONDANSETRON HCL 4 MG PO TABS: 4.0000 mg | ORAL_TABLET | Freq: Four times a day (QID) | ORAL | Status: DC | PRN

## 2020-03-01 MED ORDER — OXYCODONE-ACETAMINOPHEN 7.5-325 MG PO TABS
1.0000 | ORAL_TABLET | Freq: Four times a day (QID) | ORAL | Status: DC | PRN
  Administered 2020-03-01 – 2020-03-09 (×27): 1 via ORAL
  Filled 2020-03-01 (×29): qty 1

## 2020-03-01 MED ORDER — GUAIFENESIN ER 600 MG PO TB12
600.0000 mg | ORAL_TABLET | Freq: Two times a day (BID) | ORAL | Status: DC
  Administered 2020-03-01: 600 mg via ORAL
  Filled 2020-03-01: qty 1

## 2020-03-01 MED ORDER — OXYCODONE-ACETAMINOPHEN 5-325 MG PO TABS
1.5000 | ORAL_TABLET | Freq: Once | ORAL | Status: AC
  Administered 2020-03-01: 1.5 via ORAL
  Filled 2020-03-01: qty 2

## 2020-03-01 MED ORDER — FLUOXETINE HCL 20 MG PO CAPS
40.0000 mg | ORAL_CAPSULE | Freq: Every day | ORAL | Status: DC
  Administered 2020-03-02 – 2020-03-12 (×11): 40 mg via ORAL
  Filled 2020-03-01 (×11): qty 2

## 2020-03-01 MED ORDER — IPRATROPIUM BROMIDE 0.02 % IN SOLN: 0.5000 mg | Freq: Four times a day (QID) | RESPIRATORY_TRACT | Status: DC

## 2020-03-01 MED ORDER — OXYCODONE-ACETAMINOPHEN 5-325 MG PO TABS: 1.5000 | ORAL_TABLET | Freq: Once | ORAL | Status: DC

## 2020-03-01 MED ORDER — ONDANSETRON HCL 4 MG/2ML IJ SOLN: 4.0000 mg | Freq: Four times a day (QID) | INTRAMUSCULAR | Status: DC | PRN

## 2020-03-01 MED ORDER — LISINOPRIL 10 MG PO TABS
10.0000 mg | ORAL_TABLET | Freq: Every day | ORAL | Status: DC
  Administered 2020-03-03 – 2020-03-06 (×4): 10 mg via ORAL
  Filled 2020-03-01 (×5): qty 1

## 2020-03-01 MED ORDER — IPRATROPIUM-ALBUTEROL 20-100 MCG/ACT IN AERS
2.0000 | INHALATION_SPRAY | Freq: Four times a day (QID) | RESPIRATORY_TRACT | Status: DC
  Administered 2020-03-01: 2 via RESPIRATORY_TRACT
  Filled 2020-03-01: qty 4

## 2020-03-01 MED ORDER — HYDROCHLOROTHIAZIDE 12.5 MG PO CAPS
12.5000 mg | ORAL_CAPSULE | Freq: Every day | ORAL | Status: DC
  Administered 2020-03-03 – 2020-03-12 (×10): 12.5 mg via ORAL
  Filled 2020-03-01 (×12): qty 1

## 2020-03-01 MED ORDER — ACETAMINOPHEN 650 MG RE SUPP: 650.0000 mg | Freq: Four times a day (QID) | RECTAL | Status: DC | PRN

## 2020-03-01 MED ORDER — METHYLPREDNISOLONE SODIUM SUCC 125 MG IJ SOLR
60.0000 mg | Freq: Four times a day (QID) | INTRAMUSCULAR | Status: DC
  Administered 2020-03-01 – 2020-03-07 (×22): 60 mg via INTRAVENOUS
  Filled 2020-03-01 (×24): qty 2

## 2020-03-01 MED ORDER — PREGABALIN 75 MG PO CAPS
200.0000 mg | ORAL_CAPSULE | Freq: Two times a day (BID) | ORAL | Status: DC
  Administered 2020-03-01 – 2020-03-12 (×22): 200 mg via ORAL
  Filled 2020-03-01 (×22): qty 1

## 2020-03-01 MED ORDER — TRAZODONE HCL 50 MG PO TABS
50.0000 mg | ORAL_TABLET | Freq: Every evening | ORAL | Status: DC | PRN
  Administered 2020-03-01 – 2020-03-11 (×10): 50 mg via ORAL
  Filled 2020-03-01 (×10): qty 1

## 2020-03-01 MED ORDER — ACETAMINOPHEN 325 MG PO TABS: 650.0000 mg | ORAL_TABLET | Freq: Four times a day (QID) | ORAL | Status: DC | PRN

## 2020-03-01 MED ORDER — IPRATROPIUM-ALBUTEROL 0.5-2.5 (3) MG/3ML IN SOLN
3.0000 mL | Freq: Four times a day (QID) | RESPIRATORY_TRACT | Status: DC
  Administered 2020-03-02 (×2): 3 mL via RESPIRATORY_TRACT
  Filled 2020-03-01 (×2): qty 3

## 2020-03-01 MED ORDER — IPRATROPIUM-ALBUTEROL 0.5-2.5 (3) MG/3ML IN SOLN
3.0000 mL | Freq: Once | RESPIRATORY_TRACT | Status: AC
  Administered 2020-03-01: 3 mL via RESPIRATORY_TRACT
  Filled 2020-03-01: qty 3

## 2020-03-01 MED ORDER — ENOXAPARIN SODIUM 30 MG/0.3ML ~~LOC~~ SOLN
30.0000 mg | Freq: Every day | SUBCUTANEOUS | Status: DC
  Filled 2020-03-01 (×6): qty 0.3

## 2020-03-01 NOTE — ED Provider Notes (Signed)
Emergency Department Provider Note   I have reviewed the triage vital signs and the nursing notes.   HISTORY  Chief Complaint Shortness of Breath   HPI Amy Rubio is a 69 y.o. female with PMH of COPD on 3L home O2 presents to the emergency department with shortness of breath and wheezing gradually worsening over the past 3 days.  She has felt very fatigued and short of breath both at rest and with movement.  She has not been using her inhaler/nebulizer as she is supposed to.  She has been turning her oxygen up to 4 to 5 L at home for comfort.  She ultimately called EMS today with symptoms seeming to worsen.  She denies any fevers or chills.  She has been vaccinated against COVID-19 with her second shot in March or this year. No vomiting or diarrhea. No abdominal pain. No chest pain.   Past Medical History:  Diagnosis Date  . Adenomatous colon polyp 2007   Due surveillance colonoscopy 2012  . Anxiety   . Arthritis   . Chronic pain   . COPD (chronic obstructive pulmonary disease) (HCC)    exacerbations requiring hospitalizations x 2 at [facility]  . Depression   . Dyspnea   . Esophageal stricture 07/2009  . Fibromyalgia   . GERD (gastroesophageal reflux disease)   . Headache(784.0)   . Heart murmur   . Hypertension   . Osteoporosis   . Polio    Right lower extremity weakness  . Post poliomyelitis syndrome     Patient Active Problem List   Diagnosis Date Noted  . Protein-calorie malnutrition, severe 09/30/2019  . Acute respiratory distress   . Pressure injury of skin 03/20/2019  . COPD (chronic obstructive pulmonary disease) ([place]) 12/19/2018  . COPD with acute exacerbation ([place]) 12/18/2018  . CAP (community acquired pneumonia) 12/18/2018  . Acute respiratory failure with hypoxia ([place]) 12/18/2018  . Constipation 01/31/2018  . S/P ORIF (open reduction internal fixation) fracture( cannulated hip pins ) 07/15/17 07/30/2017  . Hypokalemia 07/15/2017  .  Hyponatremia 07/15/2017  . Closed left hip fracture, initial encounter ([place]) 07/15/2017  . Anxiety 07/15/2017  . Visual field defect 12/13/2015  . Senile purpura ([place]) 06/14/2015  . Hyperlipidemia 10/30/2013  . Osteoporosis 07/31/2013  . Essential hypertension, benign 07/31/2013  . Osteoarthritis 07/31/2013  . Chronic pain syndrome 04/24/2013  . Esophageal dysphagia 10/30/2011  . FH: colon cancer 10/30/2011  . Abnormal CT scan, esophagus 10/30/2011  . ESOPHAGEAL STRICTURE 08/24/2009  . COLONIC POLYPS, ADENOMATOUS, HX OF 08/24/2009  . CIGARETTE SMOKER 08/18/2009  . COPD exacerbation ([place]) 08/18/2009  . WEIGHT LOSS, RECENT 08/18/2009  . CHEST PAIN UNSPECIFIED 08/18/2009    Past Surgical History:  Procedure Laterality Date  . ABDOMINAL HYSTERECTOMY     PARTIAL  . CATARACT EXTRACTION W/PHACO Left 01/26/2015   Procedure: CATARACT EXTRACTION PHACO AND INTRAOCULAR LENS PLACEMENT (IOC);  Surgeon: Rutherford Guys, MD;  Location: [facility];  Service: Ophthalmology;  Laterality: Left;  CDE:7.72  . CATARACT EXTRACTION W/PHACO Right 02/23/2015   Procedure: CATARACT EXTRACTION PHACO AND INTRAOCULAR LENS PLACEMENT (IOC);  Surgeon: Rutherford Guys, MD;  Location: [facility];  Service: Ophthalmology;  Laterality: Right;  CDE:10.68  . COLONOSCOPY  2007   adenoma (1.3cm). multiple small polyps ablated, diverticulosis  . COLONOSCOPY WITH PROPOFOL N/A 03/25/2018   Procedure: COLONOSCOPY WITH PROPOFOL;  Surgeon: Daneil Dolin, MD;  Location: [facility];  Service: Endoscopy;  Laterality: N/A;  9:15am  . ESOPHAGOGASTRODUODENOSCOPY  07/23/09  distal thickened GEJ, peptic stricture narrowed lumen to 70mm, small hh, moderate gastritis (no H.Pylori), mild duodenitis.   Marland Kitchen ESOPHAGOGASTRODUODENOSCOPY  2013   Dr. Gala Romney: Possible occult cervical esophageal web status post dilation, small hiatal hernia.  Marland Kitchen HIP PINNING,CANNULATED Left 07/15/2017   Procedure: CANNULATED HIP PINNING;  Surgeon: Carole Civil, MD;   Location: [facility];  Service: Orthopedics;  Laterality: Left;  Marland Kitchen MICROLARYNGOSCOPY N/A 07/08/2019   Procedure: MICRO DIRECT LARYNGOSOCPY WITH EXCISION VOCAL CORD MASS;  Surgeon: Leta Baptist, MD;  Location: [facility];  Service: ENT;  Laterality: N/A;  . PARTIAL HYSTERECTOMY    . POLYPECTOMY  03/25/2018   Procedure: POLYPECTOMY;  Surgeon: Daneil Dolin, MD;  Location[facility];  Service: Endoscopy;;  sigmoid, ascending, cecal    Allergies Patient has no known allergies.  Family History  Problem Relation Age of Onset  . Colon cancer Father        63s  . Hypertension Father   . Lung cancer Sister        stage IV    Social History Social History   Tobacco Use  . Smoking status: Current Every Day Smoker    Packs/day: 0.50    Years: 45.00    Pack years: 22.50    Types: Cigarettes  . Smokeless tobacco: Never Used  Substance Use Topics  . Alcohol use: No  . Drug use: No    Review of Systems  Constitutional: No fever/chills. Positive fatigue.  Eyes: No visual changes. ENT: No sore throat. Cardiovascular: Denies chest pain. Respiratory: Positive shortness of breath and wheezing.  Gastrointestinal: No abdominal pain.  No nausea, no vomiting.  No diarrhea.  No constipation. Genitourinary: Negative for dysuria. Musculoskeletal: Negative for back pain. Skin: Negative for rash. Neurological: Negative for headaches, focal weakness or numbness.  10-point ROS otherwise negative.  ____________________________________________   PHYSICAL EXAM:  VITAL SIGNS: ED Triage Vitals  Enc Vitals Group     BP 03/01/20 1702 (!) 148/70     Pulse Rate 03/01/20 1702 75     Resp 03/01/20 1702 20     Temp 03/01/20 1702 97.6 F (36.4 C)     Temp Source 03/01/20 1702 Oral     SpO2 03/01/20 1701 97 %     Weight 03/01/20 1704 97 lb (44 kg)     Height 03/01/20 1704 5\' 5"  (1.651 m)   Constitutional: Alert and oriented. Well appearing and in no acute distress. Eyes: Conjunctivae are normal.    Head: Atraumatic. Nose: No congestion/rhinnorhea. Mouth/Throat: Mucous membranes are moist.  Neck: No stridor.  Cardiovascular: Normal rate, regular rhythm. Good peripheral circulation. Grossly normal heart sounds.   Respiratory: Normal respiratory effort.  No retractions. Lungs with bilateral end expiratory wheezing. No rales or rhonchi.  Gastrointestinal: Soft and nontender. No distention.  Musculoskeletal: No lower extremity tenderness nor edema. No gross deformities of extremities. Neurologic:  Normal speech and language. Skin:  Skin is warm, dry and intact. No rash noted.  ____________________________________________   LABS (all labs ordered are listed, but only abnormal results are displayed)  Labs Reviewed  BASIC METABOLIC PANEL - Abnormal; Notable for the following components:      Result Value   Sodium 133 (*)    Chloride 92 (*)    Glucose, Bld 101 (*)    All other components within normal limits  CBC WITH DIFFERENTIAL/PLATELET - Abnormal; Notable for the following components:   RBC 3.67 (*)    MCV 103.3 (*)  All other components within normal limits  SARS CORONAVIRUS 2 BY RT PCR [facility] ORDER, [place] LAB)  POC SARS CORONAVIRUS 2 AG -  ED  POC SARS CORONAVIRUS 2 AG -  ED   ____________________________________________  EKG   EKG Interpretation  Date/Time:  Monday Mar 01 2020 17:05:36 EDT Ventricular Rate:  73 PR Interval:    QRS Duration: 96 QT Interval:  418 QTC Calculation: 461 R Axis:   75 Text Interpretation: Sinus rhythm Probable left ventricular hypertrophy Similar to prior. No STEMI Confirmed by Nanda Quinton [phone]) on 03/01/2020 5:18:38 PM       ____________________________________________  RADIOLOGY  DG Chest Portable 1 View  Result Date: 03/01/2020 CLINICAL DATA:  Shortness of breath and wheezing for 3 days. EXAM: PORTABLE CHEST 1 VIEW COMPARISON:  Radiograph 09/29/2019, chest CT 04/28/2016 FINDINGS: Chronic  hyperinflation and bronchial thickening. Normal heart size and mediastinal contours. Aortic atherosclerosis. Left basilar Bochdalek hernia. Presumed nipple shadows projecting over the lung bases. No focal airspace disease, pulmonary edema, pleural effusion or pneumothorax. No acute osseous abnormalities are seen. IMPRESSION: 1. Chronic hyperinflation and bronchial thickening, consistent with COPD. No acute abnormality. 2. Presumed nipple shadows projecting over the lung bases, also seen on prior exam. Aortic Atherosclerosis (ICD10-I70.0). Electronically Signed   By: Keith Rake M.D.   On: 03/01/2020 17:36    ____________________________________________   PROCEDURES  Procedure(s) performed:   Procedures  CRITICAL CARE Performed by: Margette Fast Total critical care time: 35 minutes Critical care time was exclusive of separately billable procedures and treating other patients. Critical care was necessary to treat or prevent imminent or life-threatening deterioration. Critical care was time spent personally by me on the following activities: development of treatment plan with patient and/or surrogate as well as nursing, discussions with consultants, evaluation of patient's response to treatment, examination of patient, obtaining history from patient or surrogate, ordering and performing treatments and interventions, ordering and review of laboratory studies, ordering and review of radiographic studies, pulse oximetry and re-evaluation of patient's condition.  Nanda Quinton, MD Emergency Medicine  ____________________________________________   INITIAL IMPRESSION / ASSESSMENT AND PLAN / ED COURSE  Pertinent labs & imaging results that were available during my care of the patient were reviewed by me and considered in my medical decision making (see chart for details).   Patient presents emergency department with shortness of breath and wheezing over the past 3 days.  No chest pain.  She  received albuterol in route with EMS and is feeling somewhat better.  She is currently on 2 L nasal cannula and in no distress.  Plan for additional DuoNeb here patient is up-to-date on COVID-19 vaccine and not having any infection type symptoms.  No chest pain. EKG interpreted as above and similar to prior.   08:45 PM  COVID PCR negative. Duoneb given with mild improvement at rest but patient very SOB with ambulation. No hypoxemia. Plan for admit for COPD mgmt.   Discussed patient's case with [facility] to request admission. Patient and family (if present) updated with plan. Care transferred to [facility] service.  I reviewed all nursing notes, vitals, pertinent old records, EKGs, labs, imaging (as available).  ____________________________________________  FINAL CLINICAL IMPRESSION(S) / ED DIAGNOSES  Final diagnoses:  COPD exacerbation ([place])    MEDICATIONS GIVEN DURING THIS VISIT:  Medications  Ipratropium-Albuterol (COMBIVENT) respimat 2 puff (has no administration in time range)  methylPREDNISolone sodium succinate (SOLU-MEDROL) 125 mg/2 mL injection 125 mg (125 mg Intravenous Given 03/01/20  1733)  ipratropium-albuterol (DUONEB) 0.5-2.5 (3) MG/3ML nebulizer solution 3 mL (3 mLs Nebulization Given 03/01/20 2025)  oxyCODONE-acetaminophen (PERCOCET/ROXICET) 5-325 MG per tablet 1.5 tablet (1.5 tablets Oral Given 03/01/20 1731)    Note:  This document was prepared using Dragon voice recognition software and may include unintentional dictation errors.  Nanda Quinton, MD, [facility] Emergency Medicine    Jolie Strohecker, [facility], MD 03/01/20 2312

## 2020-03-01 NOTE — ED Triage Notes (Signed)
PT brought in by [facility] for worsening SOB and wheezing x3 days. PT states she hasn't felt well and hasn't been using her breathing treatments scheduled at home. PT was given 2.5 albuterol and 0.5 Atrovent in route and states she wears 4-5L oxygen at home via concentrator.

## 2020-03-01 NOTE — Progress Notes (Signed)
Lovenox per Pharmacy for DVT Prophylaxis    Pharmacy has been consulted from dosing enoxaparin (lovenox) in this patient for DVT prophylaxis.  The pharmacist has reviewed pertinent labs (Hgb _12__; PLT_266__), patient weight (_44__kg) and renal function (CrCl__46_mL/min) and decided that enoxaparin _30_mg SQ Q24Hrs is appropriate for this patient.  The pharmacy department will sign off at this time.  Please reconsult pharmacy if status changes or for further issues.  Thank you  Cyndia Diver PharmD, BCPS  03/01/2020, 11:25 PM

## 2020-03-01 NOTE — ED Notes (Signed)
Pt ambulated on RA with unsteady gait, oxygen levels around 93-94%, c/o SOB. Able to ambulate appx. 10 ft before returning to exam room. MD aware.

## 2020-03-01 NOTE — H&P (Signed)
TRH H&P    Patient Demographics:    Amy Rubio, is a 69 y.o. female  MRN: [id number]  DOB - 07/18/1951  Admit Date - 03/01/2020  Referring MD/NP/PA: Nanda Quinton  Outpatient Primary MD for the patient is Redmond School, MD  Patient coming from: Home  Chief complaint-shortness of breath   HPI:    Amy Rubio  is a 69 y.o. female, with past medical history of hypertension, COPD on 3 L/min of oxygen via nasal cannula came to ED with complaints of shortness of breath and wheezing.  Patient stated for past 1 week she has been having worsening shortness of breath and had to use 4 to 5 L of oxygen via nasal cannula.  Usually sees on 2 to 3 L/min of oxygen at home.  She admits to coughing up some phlegm.  Denies fever or chills.  No chest pain. She denies nausea vomiting or diarrhea. In the ED SARS-CoV-2 test was negative, chest x-ray shows no acute abnormality. Patient was given Solu-Medrol DuoNeb nebulizers.  He continues to have wheezing. She is requiring 3 L of oxygen via nasal cannula in the ED.    Review of systems:    In addition to the HPI above,   All other systems reviewed and are negative.    Past History of the following :    Past Medical History:  Diagnosis Date  . Adenomatous colon polyp 2007   Due surveillance colonoscopy 2012  . Anxiety   . Arthritis   . Chronic pain   . COPD (chronic obstructive pulmonary disease) (HCC)    exacerbations requiring hospitalizations x 2 at [facility]  . Depression   . Dyspnea   . Esophageal stricture 07/2009  . Fibromyalgia   . GERD (gastroesophageal reflux disease)   . Headache(784.0)   . Heart murmur   . Hypertension   . Osteoporosis   . Polio    Right lower extremity weakness  . Post poliomyelitis syndrome       Past Surgical History:  Procedure Laterality Date  . ABDOMINAL HYSTERECTOMY     PARTIAL  . CATARACT EXTRACTION W/PHACO  Left 01/26/2015   Procedure: CATARACT EXTRACTION PHACO AND INTRAOCULAR LENS PLACEMENT (IOC);  Surgeon: Rutherford Guys, MD;  Location: [facility];  Service: Ophthalmology;  Laterality: Left;  CDE:7.72  . CATARACT EXTRACTION W/PHACO Right 02/23/2015   Procedure: CATARACT EXTRACTION PHACO AND INTRAOCULAR LENS PLACEMENT (IOC);  Surgeon: Rutherford Guys, MD;  Location: [facility];  Service: Ophthalmology;  Laterality: Right;  CDE:10.68  . COLONOSCOPY  2007   adenoma (1.3cm). multiple small polyps ablated, diverticulosis  . COLONOSCOPY WITH PROPOFOL N/A 03/25/2018   Procedure: COLONOSCOPY WITH PROPOFOL;  Surgeon: Daneil Dolin, MD;  Location: [facility];  Service: Endoscopy;  Laterality: N/A;  9:15am  . ESOPHAGOGASTRODUODENOSCOPY  07/23/09   distal thickened GEJ, peptic stricture narrowed lumen to 62m, small hh, moderate gastritis (no H.Pylori), mild duodenitis.   .Marland KitchenESOPHAGOGASTRODUODENOSCOPY  2013   Dr. RGala Romney Possible occult cervical esophageal web status post dilation, small hiatal hernia.  .Marland Kitchen  HIP PINNING,CANNULATED Left 07/15/2017   Procedure: CANNULATED HIP PINNING;  Surgeon: Carole Civil, MD;  Location: [facility];  Service: Orthopedics;  Laterality: Left;  Marland Kitchen MICROLARYNGOSCOPY N/A 07/08/2019   Procedure: MICRO DIRECT LARYNGOSOCPY WITH EXCISION VOCAL CORD MASS;  Surgeon: Leta Baptist, MD;  Location: [facility];  Service: ENT;  Laterality: N/A;  . PARTIAL HYSTERECTOMY    . POLYPECTOMY  03/25/2018   Procedure: POLYPECTOMY;  Surgeon: Daneil Dolin, MD;  Location: [facility];  Service: Endoscopy;;  sigmoid, ascending, cecal      Social History:      Social History   Tobacco Use  . Smoking status: Current Every Day Smoker    Packs/day: 0.50    Years: 45.00    Pack years: 22.50    Types: Cigarettes  . Smokeless tobacco: Never Used  Substance Use Topics  . Alcohol use: No       Family History :     Family History  Problem Relation Age of Onset  . Colon cancer Father        17s  .  Hypertension Father   . Lung cancer Sister        stage IV      Home Medications:   Prior to Admission medications   Medication Sig Start Date End Date Taking? Authorizing Provider  albuterol (PROVENTIL) (2.5 MG/3ML) 0.083% nebulizer solution Take 3 mLs (2.5 mg total) by nebulization every 4 (four) hours as needed for wheezing or shortness of breath. Patient taking differently: Take 2.5 mg by nebulization 3 (three) times daily.  12/19/18   [name], MD  albuterol (VENTOLIN HFA) 108 (90 Base) MCG/ACT inhaler INHALE 2 PUFFS INTO THE LUNGS EVERY 4 HOURS AS NEEDED FOR WHEEZING ORSHORTNESS OF BREATH. Patient taking differently: Inhale 2 puffs into the lungs every 4 (four) hours as needed for wheezing or shortness of breath. INHALE 2 PUFFS INTO THE LUNGS EVERY 4 HOURS AS NEEDED FOR WHEEZING ORSHORTNESS OF BREATH. 02/14/17   Kathyrn Drown, MD  feeding supplement, ENSURE ENLIVE, (ENSURE ENLIVE) LIQD Take 237 mLs by mouth 2 (two) times daily between meals. 03/23/19   Lavina Hamman, MD  FLUoxetine (PROZAC) 40 MG capsule Take 40 mg by mouth daily.    [provider]  guaiFENesin (MUCINEX) 600 MG 12 hr tablet Take 1 tablet (600 mg total) by mouth 2 (two) times daily. Patient taking differently: Take 600 mg by mouth daily.  12/19/18   [name], MD  lisinopril-hydrochlorothiazide (PRINZIDE,ZESTORETIC) 10-12.5 MG tablet Take 1 tablet by mouth daily. 01/02/18   [provider]  Multiple Vitamin (MULTIVITAMIN WITH MINERALS) TABS tablet Take 1 tablet by mouth daily. 10/07/19   [name], MD  omeprazole (PRILOSEC) 20 MG capsule Take 1 capsule (20 mg total) by mouth daily. Patient taking differently: Take 20 mg by mouth daily as needed.  08/18/16 08/18/20  Kathyrn Drown, MD  oxyCODONE-acetaminophen (PERCOCET) 7.5-325 MG tablet Take 1 tablet by mouth 4 (four) times daily as needed. 09/20/19   [provider]  pregabalin (LYRICA) 200 MG capsule Take 200 mg by mouth 2  (two) times daily. 09/20/19   [provider]  traZODone (DESYREL) 50 MG tablet Take 1 tablet (50 mg total) by mouth at bedtime. Patient taking differently: Take 50 mg by mouth at bedtime as needed.  02/14/17   Kathyrn Drown, MD  TRELEGY ELLIPTA 100-62.5-25 MCG/INH AEPB Take 1 puff by mouth daily.  01/29/18   [provider]  Allergies:    No Known Allergies   Physical Exam:   Vitals  Blood pressure (!) 148/70, pulse 75, temperature 97.6 F (36.4 C), temperature source Oral, resp. rate 20, height 5' 5"  (1.651 m), weight 44 kg, SpO2 95 %.  1.  General: Appears in no acute distress  2. Psychiatric: Alert, oriented x3, intact insight and judgment  3. Neurologic: Cranial nerves II through XII grossly intact, no focal deficit noted  4. HEENMT:  Atraumatic normocephalic, extraocular muscles are intact  5. Respiratory : Bilateral wheezing auscultated  6. Cardiovascular : S1-S2, regular, no murmur auscultated, no edema in the lower extremities  7. Gastrointestinal:  Abdomen is soft, nontender, organomegaly  8. Skin:  No rashes noted     Data Review:    CBC Recent Labs  Lab 03/01/20 1758  WBC 4.0  HGB 12.0  HCT 37.9  PLT 266  MCV 103.3*  MCH 32.7  MCHC 31.7  RDW 14.7  LYMPHSABS 0.9  MONOABS 0.4  EOSABS 0.1  BASOSABS 0.0   ------------------------------------------------------------------------------------------------------------------  Results for orders placed or performed during the hospital encounter of 03/01/20 (from the past 48 hour(s))  Basic metabolic panel     Status: Abnormal   Collection Time: 03/01/20  5:58 PM  Result Value Ref Range   Sodium 133 (L) 135 - 145 mmol/L   Potassium 3.6 3.5 - 5.1 mmol/L   Chloride 92 (L) 98 - 111 mmol/L   CO2 30 22 - 32 mmol/L   Glucose, Bld 101 (H) 70 - 99 mg/dL    Comment: Glucose reference range applies only to samples taken after fasting for at least 8 hours.   BUN 15 8 - 23 mg/dL    Creatinine, Ser 0.49 0.44 - 1.00 mg/dL   Calcium 9.0 8.9 - 10.3 mg/dL   GFR calc non Af Amer >60 >60 mL/min   GFR calc Af Amer >60 >60 mL/min   Anion gap 11 5 - 15    Comment: Performed at [facility], [address] [place], [place] [address]  CBC with Differential     Status: Abnormal   Collection Time: 03/01/20  5:58 PM  Result Value Ref Range   WBC 4.0 4.0 - 10.5 K/uL   RBC 3.67 (L) 3.87 - 5.11 MIL/uL   Hemoglobin 12.0 12.0 - 15.0 g/dL   HCT 37.9 36.0 - 46.0 %   MCV 103.3 (H) 80.0 - 100.0 fL   MCH 32.7 26.0 - 34.0 pg   MCHC 31.7 30.0 - 36.0 g/dL   RDW 14.7 11.5 - 15.5 %   Platelets 266 150 - 400 K/uL   nRBC 0.0 0.0 - 0.2 %   Neutrophils Relative % 64 %   Neutro Abs 2.6 1.7 - 7.7 K/uL   Lymphocytes Relative 23 %   Lymphs Abs 0.9 0.7 - 4.0 K/uL   Monocytes Relative 9 %   Monocytes Absolute 0.4 0.1 - 1.0 K/uL   Eosinophils Relative 3 %   Eosinophils Absolute 0.1 0.0 - 0.5 K/uL   Basophils Relative 1 %   Basophils Absolute 0.0 0.0 - 0.1 K/uL   Immature Granulocytes 0 %   Abs Immature Granulocytes 0.01 0.00 - 0.07 K/uL    Comment: Performed at [facility], [address], [place], [place] [address]  POC SARS Coronavirus 2 Ag-ED -     Status: None   Collection Time: 03/01/20  6:04 PM  Result Value Ref Range   SARS Coronavirus 2 Ag NEGATIVE NEGATIVE  Comment: (NOTE) SARS-CoV-2 antigen NOT DETECTED.  Negative results are presumptive.  Negative results do not preclude SARS-CoV-2 infection and should not be used as the sole basis for treatment or other patient management decisions, including infection  control decisions, particularly in the presence of clinical signs and  symptoms consistent with COVID-19, or in those who have been in contact with the virus.  Negative results must be combined with clinical observations, patient history, and epidemiological information. The expected result is Negative. Fact Sheet for Patients: PodPark.tn Fact  Sheet for Healthcare Providers: GiftContent.is This test is not yet approved or cleared by the [place] FDA and  has been authorized for detection and/or diagnosis of SARS-CoV-2 by FDA under an Emergency Use Authorization (EUA).  This EUA will remain in effect (meaning this test can be used) for the duration of  the COVID-19 de claration under Section 564(b)(1) of the Act, 21 U.S.C. section 360bbb-3(b)(1), unless the authorization is terminated or revoked sooner.   POC SARS Coronavirus 2 Ag-ED - Nasal Swab (BD Veritor Kit)     Status: None   Collection Time: 03/01/20  6:09 PM  Result Value Ref Range   SARS Coronavirus 2 Ag NEGATIVE NEGATIVE    Comment: (NOTE) SARS-CoV-2 antigen NOT DETECTED.  Negative results are presumptive.  Negative results do not preclude SARS-CoV-2 infection and should not be used as the sole basis for treatment or other patient management decisions, including infection  control decisions, particularly in the presence of clinical signs and  symptoms consistent with COVID-19, or in those who have been in contact with the virus.  Negative results must be combined with clinical observations, patient history, and epidemiological information. The expected result is Negative. Fact Sheet for Patients: PodPark.tn Fact Sheet for Healthcare Providers: GiftContent.is This test is not yet approved or cleared by the [place] FDA and  has been authorized for detection and/or diagnosis of SARS-CoV-2 by FDA under an Emergency Use Authorization (EUA).  This EUA will remain in effect (meaning this test can be used) for the duration of  the COVID-19 de claration under Section 564(b)(1) of the Act, 21 U.S.C. section 360bbb-3(b)(1), unless the authorization is terminated or revoked sooner.   SARS Coronavirus 2 by RT PCR (hospital order, performed in [facility] hospital lab)  Nasopharyngeal Nasopharyngeal Swab     Status: None   Collection Time: 03/01/20  6:20 PM   Specimen: Nasopharyngeal Swab  Result Value Ref Range   SARS Coronavirus 2 NEGATIVE NEGATIVE    Comment: (NOTE) SARS-CoV-2 target nucleic acids are NOT DETECTED. The SARS-CoV-2 RNA is generally detectable in upper and lower respiratory specimens during the acute phase of infection. The lowest concentration of SARS-CoV-2 viral copies this assay can detect is 250 copies / mL. A negative result does not preclude SARS-CoV-2 infection and should not be used as the sole basis for treatment or other patient management decisions.  A negative result may occur with improper specimen collection / handling, submission of specimen other than nasopharyngeal swab, presence of viral mutation(s) within the areas targeted by this assay, and inadequate number of viral copies (<250 copies / mL). A negative result must be combined with clinical observations, patient history, and epidemiological information. Fact Sheet for Patients:   StrictlyIdeas.no Fact Sheet for Healthcare Providers: BankingDealers.co.za This test is not yet approved or cleared  by the [place] FDA and has been authorized for detection and/or diagnosis of SARS-CoV-2 by FDA under an Emergency Use Authorization (EUA).  This EUA will  remain in effect (meaning this test can be used) for the duration of the COVID-19 declaration under Section 564(b)(1) of the Act, 21 U.S.C. section 360bbb-3(b)(1), unless the authorization is terminated or revoked sooner. Performed at [facility], [address], [place], [place] [address]     Chemistries  Recent Labs  Lab 03/01/20 1758  NA 133*  K 3.6  CL 92*  CO2 30  GLUCOSE 101*  BUN 15  CREATININE 0.49  CALCIUM 9.0    ------------------------------------------------------------------------------------------------------------------  --------------------------------------------------------------------------------------------------------------- Urine analysis:    Component Value Date/Time   COLORURINE STRAW (A) 08/25/2019 0103   APPEARANCEUR CLEAR 08/25/2019 0103   LABSPEC 1.003 (L) 08/25/2019 0103   PHURINE 7.0 08/25/2019 0103   GLUCOSEU NEGATIVE 08/25/2019 0103   HGBUR NEGATIVE 08/25/2019 0103   BILIRUBINUR NEGATIVE 08/25/2019 0103   KETONESUR NEGATIVE 08/25/2019 0103   PROTEINUR NEGATIVE 08/25/2019 0103   NITRITE NEGATIVE 08/25/2019 0103   LEUKOCYTESUR NEGATIVE 08/25/2019 0103      Imaging Results:    DG Chest Portable 1 View  Result Date: 03/01/2020 CLINICAL DATA:  Shortness of breath and wheezing for 3 days. EXAM: PORTABLE CHEST 1 VIEW COMPARISON:  Radiograph 09/29/2019, chest CT 04/28/2016 FINDINGS: Chronic hyperinflation and bronchial thickening. Normal heart size and mediastinal contours. Aortic atherosclerosis. Left basilar Bochdalek hernia. Presumed nipple shadows projecting over the lung bases. No focal airspace disease, pulmonary edema, pleural effusion or pneumothorax. No acute osseous abnormalities are seen. IMPRESSION: 1. Chronic hyperinflation and bronchial thickening, consistent with COPD. No acute abnormality. 2. Presumed nipple shadows projecting over the lung bases, also seen on prior exam. Aortic Atherosclerosis (ICD10-I70.0). Electronically Signed   By: Keith Rake M.D.   On: 03/01/2020 17:36    My personal review of EKG: Rhythm NSR, LVH   Assessment & Plan:    Active Problems:   COPD exacerbation ([place])   1. CP exacerbation-patient presented with worsening shortness of breath, has persistent wheezing despite being given Solu-Medrol and duo nebs.  Medications ", start Solu-Medrol 60 mg IV every 6 hours, DuoNeb nebulizers scheduled every 6 hours.  Mucinex 600 mg p.o.  twice daily. 2. Hypertension-blood pressure is stable, continue Zestoretic. 3. Depression-continue Prozac. 4. Chronic pain syndrome-continue Lyrica, Percocet as needed.    DVT Prophylaxis-   Lovenox   AM Labs Ordered, also please review Full Orders  Family Communication: Admission, patients condition and plan of care including tests being ordered have been discussed with the patient and  who indicate understanding and agree with the plan and Code Status.  Code Status: Full code  Admission status: Observation  Time spent in minutes : 60 minutes   Magie Ciampa S Lakyia Behe M.D

## 2020-03-02 DIAGNOSIS — Z20822 Contact with and (suspected) exposure to covid-19: Secondary | ICD-10-CM | POA: Diagnosis not present

## 2020-03-02 DIAGNOSIS — J449 Chronic obstructive pulmonary disease, unspecified: Secondary | ICD-10-CM | POA: Diagnosis not present

## 2020-03-02 DIAGNOSIS — B370 Candidal stomatitis: Secondary | ICD-10-CM | POA: Diagnosis not present

## 2020-03-02 DIAGNOSIS — J9622 Acute and chronic respiratory failure with hypercapnia: Secondary | ICD-10-CM | POA: Diagnosis not present

## 2020-03-02 DIAGNOSIS — E785 Hyperlipidemia, unspecified: Secondary | ICD-10-CM | POA: Diagnosis present

## 2020-03-02 DIAGNOSIS — K219 Gastro-esophageal reflux disease without esophagitis: Secondary | ICD-10-CM | POA: Diagnosis present

## 2020-03-02 DIAGNOSIS — R0603 Acute respiratory distress: Secondary | ICD-10-CM | POA: Diagnosis not present

## 2020-03-02 DIAGNOSIS — J383 Other diseases of vocal cords: Secondary | ICD-10-CM | POA: Diagnosis present

## 2020-03-02 DIAGNOSIS — J441 Chronic obstructive pulmonary disease with (acute) exacerbation: Secondary | ICD-10-CM | POA: Diagnosis not present

## 2020-03-02 DIAGNOSIS — Z681 Body mass index (BMI) 19 or less, adult: Secondary | ICD-10-CM | POA: Diagnosis not present

## 2020-03-02 DIAGNOSIS — M810 Age-related osteoporosis without current pathological fracture: Secondary | ICD-10-CM | POA: Diagnosis present

## 2020-03-02 DIAGNOSIS — F17210 Nicotine dependence, cigarettes, uncomplicated: Secondary | ICD-10-CM | POA: Diagnosis present

## 2020-03-02 DIAGNOSIS — E43 Unspecified severe protein-calorie malnutrition: Secondary | ICD-10-CM | POA: Diagnosis not present

## 2020-03-02 DIAGNOSIS — Z8249 Family history of ischemic heart disease and other diseases of the circulatory system: Secondary | ICD-10-CM | POA: Diagnosis not present

## 2020-03-02 DIAGNOSIS — G894 Chronic pain syndrome: Secondary | ICD-10-CM | POA: Diagnosis present

## 2020-03-02 DIAGNOSIS — G14 Postpolio syndrome: Secondary | ICD-10-CM | POA: Diagnosis present

## 2020-03-02 DIAGNOSIS — Z66 Do not resuscitate: Secondary | ICD-10-CM | POA: Diagnosis not present

## 2020-03-02 DIAGNOSIS — I10 Essential (primary) hypertension: Secondary | ICD-10-CM | POA: Diagnosis present

## 2020-03-02 DIAGNOSIS — Z9981 Dependence on supplemental oxygen: Secondary | ICD-10-CM | POA: Diagnosis not present

## 2020-03-02 DIAGNOSIS — Z7189 Other specified counseling: Secondary | ICD-10-CM | POA: Diagnosis not present

## 2020-03-02 DIAGNOSIS — Z515 Encounter for palliative care: Secondary | ICD-10-CM | POA: Diagnosis not present

## 2020-03-02 DIAGNOSIS — Z86010 Personal history of colonic polyps: Secondary | ICD-10-CM | POA: Diagnosis not present

## 2020-03-02 DIAGNOSIS — Z800 Family history of malignant neoplasm of digestive organs: Secondary | ICD-10-CM | POA: Diagnosis not present

## 2020-03-02 DIAGNOSIS — F329 Major depressive disorder, single episode, unspecified: Secondary | ICD-10-CM | POA: Diagnosis present

## 2020-03-02 DIAGNOSIS — R64 Cachexia: Secondary | ICD-10-CM | POA: Diagnosis not present

## 2020-03-02 DIAGNOSIS — M797 Fibromyalgia: Secondary | ICD-10-CM | POA: Diagnosis present

## 2020-03-02 DIAGNOSIS — J9621 Acute and chronic respiratory failure with hypoxia: Secondary | ICD-10-CM | POA: Diagnosis not present

## 2020-03-02 DIAGNOSIS — F410 Panic disorder [episodic paroxysmal anxiety] without agoraphobia: Secondary | ICD-10-CM | POA: Diagnosis present

## 2020-03-02 LAB — COMPREHENSIVE METABOLIC PANEL
ALT: 10 U/L (ref 0–44)
AST: 13 U/L — ABNORMAL LOW (ref 15–41)
Albumin: 3.9 g/dL (ref 3.5–5.0)
Alkaline Phosphatase: 54 U/L (ref 38–126)
Anion gap: 11 (ref 5–15)
BUN: 20 mg/dL (ref 8–23)
CO2: 31 mmol/L (ref 22–32)
Calcium: 9.3 mg/dL (ref 8.9–10.3)
Chloride: 93 mmol/L — ABNORMAL LOW (ref 98–111)
Creatinine, Ser: 0.59 mg/dL (ref 0.44–1.00)
GFR calc Af Amer: >60 mL/min (ref >60)
GFR calc non Af Amer: >60 mL/min (ref >60)
Glucose, Bld: 157 mg/dL — ABNORMAL HIGH (ref 70–99)
Potassium: 4 mmol/L (ref 3.5–5.1)
Sodium: 135 mmol/L (ref 135–145)
Total Bilirubin: 0.5 mg/dL (ref 0.3–1.2)
Total Protein: 7.1 g/dL (ref 6.5–8.1)

## 2020-03-02 LAB — MAGNESIUM: Magnesium: 1.8 mg/dL (ref 1.7–2.4)

## 2020-03-02 LAB — CBC
HCT: 38.1 % (ref 36.0–46.0)
Hemoglobin: 12.2 g/dL (ref 12.0–15.0)
MCH: 32.6 pg (ref 26.0–34.0)
MCHC: 32 g/dL (ref 30.0–36.0)
MCV: 101.9 fL — ABNORMAL HIGH (ref 80.0–100.0)
Platelets: 292 10*3/uL (ref 150–400)
RBC: 3.74 MIL/uL — ABNORMAL LOW (ref 3.87–5.11)
RDW: 14.6 % (ref 11.5–15.5)
WBC: 1.7 10*3/uL — ABNORMAL LOW (ref 4.0–10.5)
nRBC: 0 % (ref 0.0–0.2)

## 2020-03-02 LAB — HIV ANTIBODY (ROUTINE TESTING W REFLEX): HIV Screen 4th Generation wRfx: NONREACTIVE

## 2020-03-02 MED ORDER — BUDESONIDE 0.25 MG/2ML IN SUSP
0.2500 mg | Freq: Two times a day (BID) | RESPIRATORY_TRACT | Status: DC
  Administered 2020-03-02 – 2020-03-03 (×3): 0.25 mg via RESPIRATORY_TRACT
  Filled 2020-03-02 (×3): qty 2

## 2020-03-02 MED ORDER — ALPRAZOLAM 0.5 MG PO TABS
0.5000 mg | ORAL_TABLET | Freq: Three times a day (TID) | ORAL | Status: DC | PRN
  Administered 2020-03-02 – 2020-03-09 (×12): 0.5 mg via ORAL
  Filled 2020-03-02 (×14): qty 1

## 2020-03-02 MED ORDER — IPRATROPIUM BROMIDE 0.02 % IN SOLN
0.5000 mg | RESPIRATORY_TRACT | Status: DC | PRN
  Administered 2020-03-06: 0.5 mg via RESPIRATORY_TRACT
  Filled 2020-03-02: qty 2.5

## 2020-03-02 MED ORDER — LEVALBUTEROL HCL 0.63 MG/3ML IN NEBU
0.6300 mg | INHALATION_SOLUTION | RESPIRATORY_TRACT | Status: DC | PRN
  Administered 2020-03-03 – 2020-03-06 (×2): 0.63 mg via RESPIRATORY_TRACT
  Filled 2020-03-02 (×2): qty 3

## 2020-03-02 MED ORDER — ALBUTEROL SULFATE (2.5 MG/3ML) 0.083% IN NEBU: 2.5000 mg | INHALATION_SOLUTION | RESPIRATORY_TRACT | Status: DC | PRN

## 2020-03-02 MED ORDER — GUAIFENESIN-DM 100-10 MG/5ML PO SYRP
10.0000 mL | ORAL_SOLUTION | Freq: Four times a day (QID) | ORAL | Status: DC
  Administered 2020-03-02 – 2020-03-08 (×26): 10 mL via ORAL
  Filled 2020-03-02 (×28): qty 10

## 2020-03-02 MED ORDER — LEVOFLOXACIN 500 MG PO TABS
500.0000 mg | ORAL_TABLET | Freq: Every day | ORAL | Status: DC
  Administered 2020-03-02: 500 mg via ORAL
  Filled 2020-03-02: qty 1

## 2020-03-02 NOTE — Progress Notes (Signed)
Patient having coughing spell. Very strong and productive, but unable to cough up much mucous. Patient reports when she does cough up anything, it is a yellow color. During coughing spells, she gets red in the face and becomes very winded afterwards. Not currently on any abx. O2 sats 90-92% on 5L O2. MD notified. Will contact respiratory to administer PRN nebulizer treatment.

## 2020-03-02 NOTE — Progress Notes (Signed)
PROGRESS NOTE    Patient: Amy Rubio                            PCP: Redmond School, MD                    DOB: [birth year]            DOA: 03/01/2020 [id number]             DOS: 03/02/2020, 8:24 AM   LOS: 0 days  Presenting from: Home Date of Service: The patient was seen and examined on 03/02/2020  Subjective:   The patient was seen and examined this Am. Stable, remains on 3 L of oxygen satting 96% - 98% Still complaining of : Shortness of breath, wheezing, congestion. Otherwise no issues overnight .  Brief Narrative:  Aarin Fryling  is a 69 y.o. female, with past medical history of hypertension, COPD on 3 L/min of oxygen via nasal cannula came to ED with complaints of shortness of breath and wheezing.  Patient stated for past 1 week she has been having worsening shortness of breath and had to use 4 to 5 L of oxygen via nasal cannula.  Usually sees on 2 to 3 L/min of oxygen at home.   She admits to coughing up some phlegm.  Denies fever or chills.  No chest pain. She denies nausea vomiting or diarrhea. ED presentation /evaluation:  Shortness of breath, wheezing, hemodynamically stable ED SARS-CoV-2 test was negative, chest x-ray shows no acute abnormality. Patient was given Solu-Medrol DuoNeb nebulizers.  He continues to have wheezing. She is requiring 3 L of oxygen via nasal cannula in the ED.   Patient currently remained stable  Assessment & Plan:   Active Problems:   COPD exacerbation (HCC) Chronic tobacco abuse Hypertension Depression/anxiety Chronic pain syndrome   Shortness of breath/hypoxia, acute on chronic respiratory failure COPD exacerbation -Continue complaint shortness of breath -We will continue O2 supplements to maintain O2 sat 88-92% -Currently on 3 L of oxygen, satting 96% -Continue to audibly wheezing, rhonchi, continue DuoNeb bronchodilators, patient has received initial dose of Solu-Medrol 125 mg IV, continue with 60 mg IV every 6 hours We will  continue with mucolytic's Consider empiric antibiotic azithromycin   DuoNeb nebulizers scheduled every 6 hours.  Hypertension-blood pressure is stable, continue Zestoretic.  Depression/anxiety -continue Prozac. -As needed Xanax  Chronic tobacco abuse  -Admits smoking since age of 70 -now she is 69 years old She has cut back down to half a pack a day -Advised to quit ASAP, NicoDerm patch offered-   chronic pain syndrome -continue Lyrica, Percocet as needed    Nutritional status:         Cultures; None at this time  Antimicrobials: Azithromycin empiric x5 days    Consultants: None at this time   ------------------------------------------------------------------------------------------------------------------------------------------------  DVT prophylaxis: SCD/Compression stockings and Lovenox SQ Code Status:   Code Status: Full Code Family Communication: No family member present at bedside- attempt will be made to update daily The above findings and plan of care has been discussed with patient (and family )  in detail,  they expressed understanding and agreement of above. -Advance care planning has been discussed.   Admission status:   Status is: Observation  The patient remains OBS appropriate and will d/c before 2 midnights.  Dispo: The patient is from: Home  Anticipated d/c is to: Home              Anticipated d/c date is: 1 day              Patient currently is not medically stable to d/c.  Continue to have shortness of breath, audibly wheezing, requiring supplemental oxygen          Procedures:   No admission procedures for hospital encounter.   None   Antimicrobials:  Anti-infectives (From admission, onward)   None       Medication:  . budesonide (PULMICORT) nebulizer solution  0.25 mg Nebulization BID  . enoxaparin (LOVENOX) injection  30 mg Subcutaneous QHS  . FLUoxetine  40 mg Oral Daily  .  guaiFENesin-dextromethorphan  10 mL Oral Q6H  . lisinopril  10 mg Oral Daily   And  . hydrochlorothiazide  12.5 mg Oral Daily  . ipratropium-albuterol  3 mL Nebulization Q6H  . methylPREDNISolone (SOLU-MEDROL) injection  60 mg Intravenous Q6H  . pregabalin  200 mg Oral BID    acetaminophen **OR** acetaminophen, albuterol, ALPRAZolam, ondansetron **OR** ondansetron (ZOFRAN) IV, oxyCODONE-acetaminophen, traZODone   Objective:   Vitals:   03/01/20 2334 03/02/20 0057 03/02/20 0443 03/02/20 0729  BP: (!) 134/51  (!) 124/53   Pulse: 63  63   Resp: 16  17   Temp: 97.9 F (36.6 C)     TempSrc:      SpO2: 97% 98% 96% 96%  Weight:      Height:       No intake or output data in the 24 hours ending 03/02/20 0824 Filed Weights   03/01/20 1704 03/01/20 2300  Weight: 44 kg 44.5 kg     Examination:   Physical Exam  Constitution: Mild-moderate shortness of breath on supplemental oxygen, alert, cooperative, no distress,  Appears calm and comfortable  Psychiatric: Normal and stable mood and affect, cognition intact,   HEENT: Normocephalic, PERRL, otherwise with in Normal limits  Chest:Chest symmetric Cardio vascular:  S1/S2, RRR, No murmure, No Rubs or Gallops  pulmonary: Diffusely wheezes, rhonchi, respirations unlabored, no rackles Abdomen: Soft, non-tender, non-distended, bowel sounds,no masses, no organomegaly Muscular skeletal: Limited exam - in bed, able to move all 4 extremities, Normal strength,  Neuro: CNII-XII intact. , normal motor and sensation, reflexes intact  Extremities: No pitting edema lower extremities, +2 pulses  Skin: Dry, warm to touch, negative for any Rashes, No open wounds Wounds: per nursing documentation Pressure Injury 03/20/19 Sacrum Right;Posterior Stage I -  Intact skin with non-blanchable redness of a localized area usually over a bony prominence. (Active)  03/20/19 0005  Location: Sacrum  Location Orientation: Right;Posterior  Staging: Stage I -   Intact skin with non-blanchable redness of a localized area usually over a bony prominence.  Wound Description (Comments):   Present on Admission: Yes    ------------------------------------------------------------------------------------------------------------------------------------------    LABs:  CBC Latest Ref Rng & Units 03/02/2020 03/01/2020 10/04/2019  WBC 4.0 - 10.5 K/uL 1.7(L) 4.0 5.0  Hemoglobin 12.0 - 15.0 g/dL 12.2 12.0 10.9(L)  Hematocrit 36.0 - 46.0 % 38.1 37.9 35.8(L)  Platelets 150 - 400 K/uL 292 266 231   CMP Latest Ref Rng & Units 03/02/2020 03/01/2020 10/04/2019  Glucose 70 - 99 mg/dL 157(H) 101(H) 87  BUN 8 - 23 mg/dL 20 15 16   Creatinine 0.44 - 1.00 mg/dL 0.59 0.49 0.54  Sodium 135 - 145 mmol/L 135 133(L) 139  Potassium 3.5 - 5.1 mmol/L 4.0 3.6 3.5  Chloride 98 -  111 mmol/L 93(L) 92(L) 94(L)  CO2 22 - 32 mmol/L 31 30 35(H)  Calcium 8.9 - 10.3 mg/dL 9.3 9.0 8.9  Total Protein 6.5 - 8.1 g/dL 7.1 - -  Total Bilirubin 0.3 - 1.2 mg/dL 0.5 - -  Alkaline Phos 38 - 126 U/L 54 - -  AST 15 - 41 U/L 13(L) - -  ALT 0 - 44 U/L 10 - -       Micro Results Recent Results (from the past 240 hour(s))  SARS Coronavirus 2 by RT PCR (hospital order, performed in [facility] hospital lab) Nasopharyngeal Nasopharyngeal Swab     Status: None   Collection Time: 03/01/20  6:20 PM   Specimen: Nasopharyngeal Swab  Result Value Ref Range Status   SARS Coronavirus 2 NEGATIVE NEGATIVE Final    Comment: (NOTE) SARS-CoV-2 target nucleic acids are NOT DETECTED. The SARS-CoV-2 RNA is generally detectable in upper and lower respiratory specimens during the acute phase of infection. The lowest concentration of SARS-CoV-2 viral copies this assay can detect is 250 copies / mL. A negative result does not preclude SARS-CoV-2 infection and should not be used as the sole basis for treatment or other patient management decisions.  A negative result may occur with improper specimen collection /  handling, submission of specimen other than nasopharyngeal swab, presence of viral mutation(s) within the areas targeted by this assay, and inadequate number of viral copies (<250 copies / mL). A negative result must be combined with clinical observations, patient history, and epidemiological information. Fact Sheet for Patients:   StrictlyIdeas.no Fact Sheet for Healthcare Providers: BankingDealers.co.za This test is not yet approved or cleared  by the [place] FDA and has been authorized for detection and/or diagnosis of SARS-CoV-2 by FDA under an Emergency Use Authorization (EUA).  This EUA will remain in effect (meaning this test can be used) for the duration of the COVID-19 declaration under Section 564(b)(1) of the Act, 21 U.S.C. section 360bbb-3(b)(1), unless the authorization is terminated or revoked sooner. Performed at [facility], [address], [place], [place] [address]     Radiology Reports DG Chest Portable 1 View  Result Date: 03/01/2020 CLINICAL DATA:  Shortness of breath and wheezing for 3 days. EXAM: PORTABLE CHEST 1 VIEW COMPARISON:  Radiograph 09/29/2019, chest CT 04/28/2016 FINDINGS: Chronic hyperinflation and bronchial thickening. Normal heart size and mediastinal contours. Aortic atherosclerosis. Left basilar Bochdalek hernia. Presumed nipple shadows projecting over the lung bases. No focal airspace disease, pulmonary edema, pleural effusion or pneumothorax. No acute osseous abnormalities are seen. IMPRESSION: 1. Chronic hyperinflation and bronchial thickening, consistent with COPD. No acute abnormality. 2. Presumed nipple shadows projecting over the lung bases, also seen on prior exam. Aortic Atherosclerosis (ICD10-I70.0). Electronically Signed   By: Keith Rake M.D.   On: 03/01/2020 17:36    SIGNED: Deatra James, MD, FACP, FHM. [facility],  Pager (please use [email or web address] to page/text)  If  7PM-7AM, please contact night-coverage [email or web address] [email or web address] [facility] 03/02/2020, 8:24 AM

## 2020-03-02 NOTE — Progress Notes (Signed)
Initial Nutrition Assessment  DOCUMENTATION CODES:      INTERVENTION:  Nutrition education   Ensure Enlive po BID, each supplement provides 350 kcal and 20 grams of protein    NUTRITION DIAGNOSIS:  Severe Malnutrition related to catabolic illness(Acute exacerbation of Chronic COPD) as evidenced by severe fat depletion, severe muscle depletion.  GOAL:  Patient will meet greater than or equal to 90% of their needs    MONITOR:  PO intake, Supplement acceptance, Labs, Skin, Weight trends    REASON FOR ASSESSMENT:   Malnutrition Screening Tool    ASSESSMENT: Patient is a 69 yo female with hx of Malnutrition, COPD, GERD, HTN, Esophageal stricture and chronic pain. She presents with shortness of breath and coughing. Home oxygen 2-3 liters at baseline per pt.   Patient has been eating well since admission. PO: 75-100% of meals. Feeds herself. At home the 1-2 weeks she has been eating poorly and too tired to stand long enough to prepare food. History of malnutrition expect ongoing due to her Chronic COPD. Attaching Handout with discharge paperwork- Eating Plan for Chronic COPD.   Her weight has been around 44-45 kg the past 7 months. Previously she has weighed 48-50 kg 1-2 years ago. A loss of 10 lb which she has been unable to regain. Talked with her about ways to increase her energy and protein intake daily and emphasized the goal of consistent focus on her nutrition intake.  Labs reviewed: Glucose 157, WBC-1.7   Medications reviewed and include: Levaquin, Prednisone, Lyrica, xanax    NUTRITION - FOCUSED PHYSICAL EXAM: Nutrition-Focused physical exam completed. Findings are severe fat depletion (buccal, orbital, thoracic), severe muscle depletion (clavicle, patellar, dorsal, scapula), and no edema.     Diet Order:   Diet Order            Diet regular Room service appropriate? Yes; Fluid consistency: Thin  Diet effective now              EDUCATION NEEDS:  Education needs  have been addressed Skin:  Skin Assessment: Reviewed RN Assessment  Last BM:  unknown  Height:   Ht Readings from Last 1 Encounters:  03/01/20 5\' 5"  (1.651 m)    Weight:   Wt Readings from Last 1 Encounters:  03/01/20 44.5 kg    Ideal Body Weight:   57 kg   BMI:  Body mass index is 16.34 kg/m.  Estimated Nutritional Needs:   Kcal:  [id number]  Protein:  67-75 gr  Fluid:  >1300 ml daily  Colman Cater MS,RD,CSG,LDN Pager: Available in [place]

## 2020-03-02 NOTE — Plan of Care (Signed)
°  Problem: Education: ?Goal: Knowledge of General Education information will improve ?Description: Including pain rating scale, medication(s)/side effects and non-pharmacologic comfort measures ?Outcome: Progressing ?  ?Problem: Health Behavior/Discharge Planning: ?Goal: Ability to manage health-related needs will improve ?Outcome: Progressing ?  ?Problem: Clinical Measurements: ?Goal: Ability to maintain clinical measurements within normal limits will improve ?Outcome: Progressing ?Goal: Will remain free from infection ?Outcome: Progressing ?Goal: Diagnostic test results will improve ?Outcome: Progressing ?Goal: Cardiovascular complication will be avoided ?Outcome: Progressing ?  ?Problem: Activity: ?Goal: Risk for activity intolerance will decrease ?Outcome: Progressing ?  ?Problem: Nutrition: ?Goal: Adequate nutrition will be maintained ?Outcome: Progressing ?  ?Problem: Coping: ?Goal: Level of anxiety will decrease ?Outcome: Progressing ?  ?Problem: Elimination: ?Goal: Will not experience complications related to bowel motility ?Outcome: Progressing ?Goal: Will not experience complications related to urinary retention ?Outcome: Progressing ?  ?Problem: Pain Managment: ?Goal: General experience of comfort will improve ?Outcome: Progressing ?  ?Problem: Safety: ?Goal: Ability to remain free from injury will improve ?Outcome: Progressing ?  ?Problem: Skin Integrity: ?Goal: Risk for impaired skin integrity will decrease ?Outcome: Progressing ?  ?Problem: Clinical Measurements: ?Goal: Respiratory complications will improve ?Outcome: Not Progressing ?  ?

## 2020-03-02 NOTE — Progress Notes (Signed)
Patient is refusing nebulizer treatment at this time. Says she feels OK now.

## 2020-03-02 NOTE — Progress Notes (Signed)
Patient requesting to be placed on 5L O2. That is what she normally puts herself on at home. Patient educated, still wishes to be placed on 5 L for a little while. Will bump back down to 4 when she's comfortable.

## 2020-03-03 DIAGNOSIS — Z7189 Other specified counseling: Secondary | ICD-10-CM

## 2020-03-03 DIAGNOSIS — E43 Unspecified severe protein-calorie malnutrition: Secondary | ICD-10-CM

## 2020-03-03 DIAGNOSIS — J9621 Acute and chronic respiratory failure with hypoxia: Secondary | ICD-10-CM

## 2020-03-03 LAB — EXPECTORATED SPUTUM ASSESSMENT W GRAM STAIN, RFLX TO RESP C

## 2020-03-03 MED ORDER — ARFORMOTEROL TARTRATE 15 MCG/2ML IN NEBU
15.0000 ug | INHALATION_SOLUTION | Freq: Two times a day (BID) | RESPIRATORY_TRACT | Status: DC
  Administered 2020-03-03 – 2020-03-08 (×9): 15 ug via RESPIRATORY_TRACT
  Filled 2020-03-03 (×9): qty 2

## 2020-03-03 MED ORDER — ADULT MULTIVITAMIN W/MINERALS CH
1.0000 | ORAL_TABLET | Freq: Every day | ORAL | Status: DC
  Administered 2020-03-03 – 2020-03-12 (×10): 1 via ORAL
  Filled 2020-03-03 (×10): qty 1

## 2020-03-03 MED ORDER — IPRATROPIUM-ALBUTEROL 0.5-2.5 (3) MG/3ML IN SOLN
3.0000 mL | Freq: Four times a day (QID) | RESPIRATORY_TRACT | Status: DC
  Administered 2020-03-03 – 2020-03-11 (×28): 3 mL via RESPIRATORY_TRACT
  Filled 2020-03-03 (×26): qty 3

## 2020-03-03 MED ORDER — AZITHROMYCIN 250 MG PO TABS
500.0000 mg | ORAL_TABLET | Freq: Every day | ORAL | Status: DC
  Administered 2020-03-03 – 2020-03-07 (×5): 500 mg via ORAL
  Filled 2020-03-03 (×6): qty 2

## 2020-03-03 MED ORDER — ALUM & MAG HYDROXIDE-SIMETH 200-200-20 MG/5ML PO SUSP
30.0000 mL | ORAL | Status: DC | PRN
  Administered 2020-03-03 – 2020-03-05 (×2): 30 mL via ORAL
  Filled 2020-03-03 (×2): qty 30

## 2020-03-03 MED ORDER — ENSURE ENLIVE PO LIQD
237.0000 mL | Freq: Two times a day (BID) | ORAL | Status: DC
  Administered 2020-03-03 – 2020-03-12 (×16): 237 mL via ORAL

## 2020-03-03 MED ORDER — BUDESONIDE 0.5 MG/2ML IN SUSP
0.5000 mg | Freq: Two times a day (BID) | RESPIRATORY_TRACT | Status: DC
  Administered 2020-03-03 – 2020-03-06 (×6): 0.5 mg via RESPIRATORY_TRACT
  Filled 2020-03-03 (×6): qty 2

## 2020-03-03 NOTE — Discharge Instructions (Signed)
Eating Plan for Chronic Obstructive Pulmonary Disease Chronic obstructive pulmonary disease (COPD) causes symptoms such as shortness of breath, coughing, and chest discomfort. These symptoms can make it difficult to eat enough to maintain a healthy weight. Generally, people with COPD should eat a diet that is high in calories, protein, and other nutrients to maintain body weight and to keep the lungs as healthy as possible. Depending on the medicines you take and other health conditions you may have, your health care provider may give you additional recommendations on what to eat or avoid. Talk with your health care provider about your goals for body weight, and work with a dietitian to develop an eating plan that is right for you. What are tips for following this plan? Reading food labels   Avoid foods with more than 300 milligrams (mg) of salt (sodium) per serving.  Choose foods that contain at least 4 grams (g) of fiber per serving. Try to eat 20-30 g of fiber each day.  Choose foods that are high in calories and protein, such as nuts, beans, yogurt, and cheese. Shopping  Do not buy foods labeled as diet, low-calorie, or low-fat.  If you are able to eat dairy products: ? Avoid low-fat or skim milk. ? Buy dairy products that have at least 2% fat.  Buy nutritional supplement drinks.  Buy grains and prepared foods labeled as enriched or fortified.  Consider buying low-sodium, pre-made foods to conserve energy for eating. Cooking  Add dry milk or protein powder to smoothies.  Cook with healthy fats, such as olive oil, canola oil, sunflower oil, and grapeseed oil.  Add oil, butter, cream cheese, or nut butters to foods to increase fat and calories.  To make foods easier to chew and swallow: ? Cook vegetables, pasta, and rice until soft. ? Cut or grind meat into very small pieces. ? Dip breads in liquid. Meal planning   Eat when you feel hungry.  Eat 5-6 small meals throughout  the day.  Drink 6-8 glasses of water each day.  Do not drink liquids with meals. Drink liquids at the end of the meal to avoid feeling full too quickly.  Eat a variety of fruits and vegetables every day.  Ask for assistance from family or friends with planning and preparing meals as needed.  Avoid foods that cause you to feel bloated, such as carbonated drinks, fried foods, beans, broccoli, cabbage, and apples.  For older adults, ask your local agency on aging whether you are eligible for meal assistance programs, such as Meals on Wheels. Lifestyle   Do not smoke.  Eat slowly. Take small bites and chew food well before swallowing.  Do not overeat. This may make it more difficult to breathe after eating.  Sit up while eating.  If needed, continue to use supplemental oxygen while eating.  Rest or relax for 30 minutes before and after eating.  Monitor your weight as told by your health care provider.  Exercise as told by your health care provider. What foods can I eat? Fruits All fresh, dried, canned, or frozen fruits that do not cause gas. Vegetables All fresh, canned (no salt added), or frozen vegetables that do not cause gas. Grains Whole grain bread. Enriched whole grain pasta. Fortified whole grain cereals. Fortified rice. Quinoa. Meats and other proteins Lean meat. Poultry. Fish. Dried beans. Unsalted nuts. Tofu. Eggs. Nut butters. Dairy Whole or 2% milk. Cheese. Yogurt. Fats and oils Olive oil. Canola oil. Butter. Margarine. Beverages Water. Vegetable   juice (no salt added). Decaffeinated coffee. Decaffeinated or herbal tea. Seasonings and condiments Fresh or dried herbs. Low-salt or salt-free seasonings. Low-sodium soy sauce. The items listed above may not be a complete list of foods and beverages you can eat. Contact a dietitian for more information. What foods are not recommended? Fruits Fruits that cause gas, such as apples or melon. Vegetables Vegetables  that cause gas, such as broccoli, Brussels sprouts, cabbage, cauliflower, and onions. Canned vegetables with added salt. Meats and other proteins Fried meat. Salt-cured meat. Processed meat. Dairy Fat-free or low-fat milk, yogurt, or cheese. Processed cheese. Beverages Carbonated drinks. Caffeinated drinks, such as coffee, tea, and soft drinks. Juice. Alcohol. Vegetable juice with added salt. Seasonings and condiments Salt. Seasoning mixes with salt. Soy sauce. Pickles. Other foods Clear soup or broth. Fried foods. Prepared frozen meals. The items listed above may not be a complete list of foods and beverages you should avoid. Contact a dietitian for more information. Summary  COPD symptoms can make it difficult to eat enough to maintain a healthy weight.  A COPD eating plan can help you maintain your body weight and keep your lungs as healthy as possible.  Eat a diet that is high in calories, protein, and other nutrients. Read labels to make sure that you are getting the right nutrients. Cook foods to make them easy to chew and swallow.  Eat 5-6 small meals throughout the day, and avoid foods that cause gas or make you feel bloated. This information is not intended to replace advice given to you by your health care provider. Make sure you discuss any questions you have with your health care provider. Document Revised: 01/09/2019 Document Reviewed: 12/04/2017 Elsevier Patient Education  2020 [employer]  

## 2020-03-03 NOTE — Progress Notes (Signed)
PROGRESS NOTE  Amy Rubio [id number] DOB: [birth year] DOA: 03/01/2020 PCP: Redmond School, MD  Brief History:  69 year old female with a history of hypertension, chronic respiratory failure on 3 L, COPD, chronic pain, hyperlipidemia, depression, anxiety, tobacco abuse presenting with 3-day history of shortness of breath and increasing wheeze.  She is complaining of increasing generalized weakness.  Unfortunately, the patient is not been using her nebulizer machine as directed.  She turned her oxygen up to 5 L without improvement.  As result she presented for further evaluation.  She has received both of her COVID-19 vaccinations.  She denies any fevers, chills, chest pain, nausea, vomiting, diarrhea, domino pain.  Assessment/Plan: Acute on chronic respiratory failure with hypoxia -Secondary to COPD exacerbation -Presently stable on 4 L nasal cannula -Wean oxygen back to 3 L as tolerated -The patient has very little pulmonary reserve--minimal exertion causes significant shortness of breath  COPD exacerbation -Increased Pulmicort -add Brovana -Restart duo nebs -Continue IV Solu-Medrol -The patient has a degree of vocal cord dysfunction causing her wheezing -Flutter valve -Continue Robitussin -viral respiratory panel  Severe protein calorie malnutrition -Continue supplements  Chronic pain syndrome -Continue home dose of Percocet  Essential hypertension -Continue lisinopril/HCTZ  Depression/anxiety -Continue home dose of fluoxetine and alprazolam   GOC -Advance care planning, including the explanation and discussion of advance directives was carried out with the patient and family.  Code status including explanations of "Full Code" and "DNR" and alternatives were discussed in detail.  Discussion of end-of-life issues including but not limited palliative care, hospice care and the concept of hospice, other end-of-life care options, power of attorney for health care  decisions, living wills, and physician orders for life-sustaining treatment were also discussed with the patient and family.  Total face to face time 16 minutes. -DNR confirmed with pt   Status is: Inpatient  Remains inpatient appropriate because:IV treatments appropriate due to intensity of illness or inability to take PO.  Dyspneic with minimal exertion requiring IV steroids and duonebs   Dispo: The patient is from: Home              Anticipated d/c is to: Home              Anticipated d/c date is: 2 days              Patient currently is not medically stable to d/c.         Family Communication:  no Family at bedside  Consultants:  none  Code Status:  DNR  DVT Prophylaxis:  [place] Lovenox   Procedures: As Listed in Progress Note Above  Antibiotics: levoflox 6/1>>6/2     Subjective: Patient complains of shortness of breath with minimal to no exertion.  She is still wheezing.  She has nonproductive cough.  She denies any hemoptysis.  Denies any fevers, chills, chest pain, nausea, vomiting, direct abdominal pain.  There is no dysuria or hematuria.  Objective: Vitals:   03/02/20 2111 03/03/20 0435 03/03/20 0730 03/03/20 0800  BP: (!) 149/67 (!) 129/55  (!) 138/49  Pulse: 68 66    Resp: 20 18    Temp: 98.2 F (36.8 C) 97.8 F (36.6 C)    TempSrc: Oral Oral    SpO2: 97% 97% 96%   Weight:      Height:        Intake/Output Summary (Last 24 hours) at 03/03/2020 1634 Last data filed at 03/03/2020 1300 Gross  per 24 hour  Intake 480 ml  Output 1700 ml  Net -1220 ml   Weight change:  Exam:   General:  Pt is alert, follows commands appropriately, not in acute distress  HEENT: No icterus, No thrush, No neck mass, [place]/AT  Cardiovascular: RRR, S1/S2, no rubs, no gallops  Respiratory: Bilateral rhonchi.  Bilateral expiratory wheeze.  Diminished breath sounds bilateral.  Abdomen: Soft/+BS, non tender, non distended, no guarding  Extremities: No edema, No  lymphangitis, No petechiae, No rashes, no synovitis   Data Reviewed: I have personally reviewed following labs and imaging studies Basic Metabolic Panel: Recent Labs  Lab 03/01/20 1758 03/02/20 0446  NA 133* 135  K 3.6 4.0  CL 92* 93*  CO2 30 31  GLUCOSE 101* 157*  BUN 15 20  CREATININE 0.49 0.59  CALCIUM 9.0 9.3  MG  --  1.8   Liver Function Tests: Recent Labs  Lab 03/02/20 0446  AST 13*  ALT 10  ALKPHOS 54  BILITOT 0.5  PROT 7.1  ALBUMIN 3.9   No results for input(s): LIPASE, AMYLASE in the last 168 hours. No results for input(s): AMMONIA in the last 168 hours. Coagulation Profile: No results for input(s): INR, PROTIME in the last 168 hours. CBC: Recent Labs  Lab 03/01/20 1758 03/02/20 0446  WBC 4.0 1.7*  NEUTROABS 2.6  --   HGB 12.0 12.2  HCT 37.9 38.1  MCV 103.3* 101.9*  PLT 266 292   Cardiac Enzymes: No results for input(s): CKTOTAL, CKMB, CKMBINDEX, TROPONINI in the last 168 hours. BNP: Invalid input(s): POCBNP CBG: No results for input(s): GLUCAP in the last 168 hours. HbA1C: No results for input(s): HGBA1C in the last 72 hours. Urine analysis:    Component Value Date/Time   COLORURINE STRAW (A) 08/25/2019 0103   APPEARANCEUR CLEAR 08/25/2019 0103   LABSPEC 1.003 (L) 08/25/2019 0103   PHURINE 7.0 08/25/2019 0103   GLUCOSEU NEGATIVE 08/25/2019 0103   HGBUR NEGATIVE 08/25/2019 0103   BILIRUBINUR NEGATIVE 08/25/2019 0103   KETONESUR NEGATIVE 08/25/2019 0103   PROTEINUR NEGATIVE 08/25/2019 0103   NITRITE NEGATIVE 08/25/2019 0103   LEUKOCYTESUR NEGATIVE 08/25/2019 0103   Sepsis Labs: @LABRCNTIP (procalcitonin:4,lacticidven:4) ) Recent Results (from the past 240 hour(s))  SARS Coronavirus 2 by RT PCR (hospital order, performed in [place] hospital lab) Nasopharyngeal Nasopharyngeal Swab     Status: None   Collection Time: 03/01/20  6:20 PM   Specimen: Nasopharyngeal Swab  Result Value Ref Range Status   SARS Coronavirus 2 NEGATIVE  NEGATIVE Final    Comment: (NOTE) SARS-CoV-2 target nucleic acids are NOT DETECTED. The SARS-CoV-2 RNA is generally detectable in upper and lower respiratory specimens during the acute phase of infection. The lowest concentration of SARS-CoV-2 viral copies this assay can detect is 250 copies / mL. A negative result does not preclude SARS-CoV-2 infection and should not be used as the sole basis for treatment or other patient management decisions.  A negative result may occur with improper specimen collection / handling, submission of specimen other than nasopharyngeal swab, presence of viral mutation(s) within the areas targeted by this assay, and inadequate number of viral copies (<250 copies / mL). A negative result must be combined with clinical observations, patient history, and epidemiological information. Fact Sheet for Patients:   StrictlyIdeas.no Fact Sheet for Healthcare Providers: BankingDealers.co.za This test is not yet approved or cleared  by the [place] FDA and has been authorized for detection and/or diagnosis of SARS-CoV-2 by FDA under an Emergency  Use Authorization (EUA).  This EUA will remain in effect (meaning this test can be used) for the duration of the COVID-19 declaration under Section 564(b)(1) of the Act, 21 U.S.C. section 360bbb-3(b)(1), unless the authorization is terminated or revoked sooner. Performed at [facility], [address] [place], [place] [address]   Expectorated sputum assessment w rflx to resp cult     Status: None   Collection Time: 03/03/20  9:00 AM   Specimen: Expectorated Sputum  Result Value Ref Range Status   Specimen Description EXPECTORATED SPUTUM  Final   Special Requests NONE  Final   Sputum evaluation   Final    THIS SPECIMEN IS ACCEPTABLE FOR SPUTUM CULTURE Performed at [facility], [address] [place], [place] [address]    Report Status 03/03/2020 FINAL  Final  Culture,  respiratory     Status: None (Preliminary result)   Collection Time: 03/03/20  9:00 AM  Result Value Ref Range Status   Specimen Description   Final    EXPECTORATED SPUTUM Performed at [facility], [address], [place], [place] [address]    Special Requests   Final    NONE Reflexed from [id number] Performed at [facility], [address], [place], [place] [address]    Gram Stain   Final    FEW WBC PRESENT, PREDOMINANTLY PMN MODERATE GRAM POSITIVE COCCI IN PAIRS IN CHAINS FEW GRAM POSITIVE RODS Performed at [facility], [place] [address], [place], [place] [address]    Culture PENDING  Incomplete   Report Status PENDING  Incomplete     Scheduled Meds: . arformoterol  15 mcg Nebulization BID  . budesonide (PULMICORT) nebulizer solution  0.5 mg Nebulization BID  . enoxaparin (LOVENOX) injection  30 mg Subcutaneous QHS  . feeding supplement (ENSURE ENLIVE)  237 mL Oral BID BM  . FLUoxetine  40 mg Oral Daily  . guaiFENesin-dextromethorphan  10 mL Oral Q6H  . lisinopril  10 mg Oral Daily   And  . hydrochlorothiazide  12.5 mg Oral Daily  . ipratropium-albuterol  3 mL Nebulization Q6H  . levofloxacin  500 mg Oral Q2200  . methylPREDNISolone (SOLU-MEDROL) injection  60 mg Intravenous Q6H  . multivitamin with minerals  1 tablet Oral Daily  . pregabalin  200 mg Oral BID   Continuous Infusions:  Procedures/Studies: DG Chest Portable 1 View  Result Date: 03/01/2020 CLINICAL DATA:  Shortness of breath and wheezing for 3 days. EXAM: PORTABLE CHEST 1 VIEW COMPARISON:  Radiograph 09/29/2019, chest CT 04/28/2016 FINDINGS: Chronic hyperinflation and bronchial thickening. Normal heart size and mediastinal contours. Aortic atherosclerosis. Left basilar Bochdalek hernia. Presumed nipple shadows projecting over the lung bases. No focal airspace disease, pulmonary edema, pleural effusion or pneumothorax. No acute osseous abnormalities are seen. IMPRESSION: 1. Chronic hyperinflation and bronchial  thickening, consistent with COPD. No acute abnormality. 2. Presumed nipple shadows projecting over the lung bases, also seen on prior exam. Aortic Atherosclerosis (ICD10-I70.0). Electronically Signed   By: Keith Rake M.D.   On: 03/01/2020 17:36    Orson Eva, DO  [facility]  If 7PM-7AM, please contact night-coverage [email or web address] Password [facility] 03/03/2020, 4:34 PM   LOS: 1 day

## 2020-03-04 LAB — CBC WITH DIFFERENTIAL/PLATELET
Abs Immature Granulocytes: 0.03 10*3/uL (ref 0.00–0.07)
Basophils Absolute: 0 10*3/uL (ref 0.0–0.1)
Basophils Relative: 0 %
Eosinophils Absolute: 0 10*3/uL (ref 0.0–0.5)
Eosinophils Relative: 0 %
HCT: 32.8 % — ABNORMAL LOW (ref 36.0–46.0)
Hemoglobin: 10.2 g/dL — ABNORMAL LOW (ref 12.0–15.0)
Immature Granulocytes: 1 %
Lymphocytes Relative: 6 %
Lymphs Abs: 0.3 10*3/uL — ABNORMAL LOW (ref 0.7–4.0)
MCH: 32.2 pg (ref 26.0–34.0)
MCHC: 31.1 g/dL (ref 30.0–36.0)
MCV: 103.5 fL — ABNORMAL HIGH (ref 80.0–100.0)
Monocytes Absolute: 0.2 10*3/uL (ref 0.1–1.0)
Monocytes Relative: 4 %
Neutro Abs: 4.9 10*3/uL (ref 1.7–7.7)
Neutrophils Relative %: 89 %
Platelets: 298 10*3/uL (ref 150–400)
RBC: 3.17 MIL/uL — ABNORMAL LOW (ref 3.87–5.11)
RDW: 14.6 % (ref 11.5–15.5)
WBC: 5.5 10*3/uL (ref 4.0–10.5)
nRBC: 0 % (ref 0.0–0.2)

## 2020-03-04 LAB — BASIC METABOLIC PANEL
Anion gap: 10 (ref 5–15)
BUN: 23 mg/dL (ref 8–23)
CO2: 34 mmol/L — ABNORMAL HIGH (ref 22–32)
Calcium: 9.1 mg/dL (ref 8.9–10.3)
Chloride: 91 mmol/L — ABNORMAL LOW (ref 98–111)
Creatinine, Ser: 0.6 mg/dL (ref 0.44–1.00)
GFR calc Af Amer: >60 mL/min (ref >60)
GFR calc non Af Amer: >60 mL/min (ref >60)
Glucose, Bld: 149 mg/dL — ABNORMAL HIGH (ref 70–99)
Potassium: 3.5 mmol/L (ref 3.5–5.1)
Sodium: 135 mmol/L (ref 135–145)

## 2020-03-04 LAB — MAGNESIUM: Magnesium: 2.2 mg/dL (ref 1.7–2.4)

## 2020-03-04 NOTE — Progress Notes (Signed)
PROGRESS NOTE  Amy Rubio [id number] DOB: [birth year] DOA: 03/01/2020 PCP: Redmond School, MD  Brief History:  69 year old female with a history of hypertension, chronic respiratory failure on 3 L, COPD, chronic pain, hyperlipidemia, depression, anxiety, tobacco abuse presenting with 3-day history of shortness of breath and increasing wheeze.  She is complaining of increasing generalized weakness.  Unfortunately, the patient is not been using her nebulizer machine as directed.  She turned her oxygen up to 5 L without improvement.  As result she presented for further evaluation.  She has received both of her COVID-19 vaccinations.  She denies any fevers, chills, chest pain, nausea, vomiting, diarrhea, domino pain.  Assessment/Plan: Acute on chronic respiratory failure with hypoxia -Secondary to COPD exacerbation -Presently stable on 4 L nasal cannula -Wean oxygen back to 3 L as tolerated -The patient has very little pulmonary reserve--minimal exertion causes significant shortness of breath -goal is to keep oxygenation 88-92%  COPD exacerbation -Increased Pulmicort -continue Brovana -Continue duo nebs -Continue IV Solu-Medrol -The patient has a degree of vocal cord dysfunction causing her wheezing -Flutter valve -Continue Robitussin -viral respiratory panel  Severe protein calorie malnutrition -Continue supplements  Chronic pain syndrome -Continue home dose of Percocet  Essential hypertension -Continue lisinopril/HCTZ  Depression/anxiety -Continue home dose of fluoxetine and alprazolam   GOC -Advance care planning, including the explanation and discussion of advance directives was carried out with the patient and family.  Code status including explanations of "Full Code" and "DNR" and alternatives were discussed in detail.  Discussion of end-of-life issues including but not limited palliative care, hospice care and the concept of hospice, other  end-of-life care options, power of attorney for health care decisions, living wills, and physician orders for life-sustaining treatment were also discussed with the patient and family.  Total face to face time 16 minutes. -DNR confirmed with pt   Status is: Inpatient  Remains inpatient appropriate because:IV treatments appropriate due to intensity of illness or inability to take PO.  Dyspneic with minimal exertion requiring IV steroids and duonebs   Dispo: The patient is from: Home  Anticipated d/c is to: Home  Anticipated d/c date is: 2 days  Patient currently is not medically stable to d/c.         Family Communication:  no Family at bedside  Consultants:  none  Code Status:  DNR  DVT Prophylaxis:  [place] Lovenox   Procedures: As Listed in Progress Note Above  Antibiotics: levoflox 6/1>>6/2    Subjective: She is breathing a little better, but remains sob with minimal exertion.  Denies n/v/d, cp, f/c, hemoptysis.  Still has nonproductive cough.  No abd pain.  Objective: Vitals:   03/04/20 0530 03/04/20 0756 03/04/20 1251 03/04/20 1439  BP: (!) 111/46  (!) 128/53   Pulse: 64  71   Resp:   16   Temp: 97.6 F (36.4 C)  97.7 F (36.5 C)   TempSrc: Oral  Oral   SpO2: 100% 99% 98% 94%  Weight:      Height:        Intake/Output Summary (Last 24 hours) at 03/04/2020 1757 Last data filed at 03/04/2020 0539 Gross per 24 hour  Intake 240 ml  Output 200 ml  Net 40 ml   Weight change:  Exam:   General:  Pt is alert, follows commands appropriately, not in acute distress  HEENT: No icterus, No thrush, No neck mass, [place]/AT  Cardiovascular: RRR, S1/S2, no  rubs, no gallops  Respiratory: diminished breath sounds.  Bilateral exp wheeze.  Bilateral rales  Abdomen: Soft/+BS, non tender, non distended, no guarding  Extremities: No edema, No lymphangitis, No petechiae, No rashes, no synovitis   Data Reviewed: I  have personally reviewed following labs and imaging studies Basic Metabolic Panel: Recent Labs  Lab 03/01/20 1758 03/02/20 0446 03/04/20 0505  NA 133* 135 135  K 3.6 4.0 3.5  CL 92* 93* 91*  CO2 30 31 34*  GLUCOSE 101* 157* 149*  BUN 15 20 23   CREATININE 0.49 0.59 0.60  CALCIUM 9.0 9.3 9.1  MG  --  1.8 2.2   Liver Function Tests: Recent Labs  Lab 03/02/20 0446  AST 13*  ALT 10  ALKPHOS 54  BILITOT 0.5  PROT 7.1  ALBUMIN 3.9   No results for input(s): LIPASE, AMYLASE in the last 168 hours. No results for input(s): AMMONIA in the last 168 hours. Coagulation Profile: No results for input(s): INR, PROTIME in the last 168 hours. CBC: Recent Labs  Lab 03/01/20 1758 03/02/20 0446 03/04/20 0505  WBC 4.0 1.7* 5.5  NEUTROABS 2.6  --  4.9  HGB 12.0 12.2 10.2*  HCT 37.9 38.1 32.8*  MCV 103.3* 101.9* 103.5*  PLT 266 292 298   Cardiac Enzymes: No results for input(s): CKTOTAL, CKMB, CKMBINDEX, TROPONINI in the last 168 hours. BNP: Invalid input(s): POCBNP CBG: No results for input(s): GLUCAP in the last 168 hours. HbA1C: No results for input(s): HGBA1C in the last 72 hours. Urine analysis:    Component Value Date/Time   COLORURINE STRAW (A) 08/25/2019 0103   APPEARANCEUR CLEAR 08/25/2019 0103   LABSPEC 1.003 (L) 08/25/2019 0103   PHURINE 7.0 08/25/2019 0103   GLUCOSEU NEGATIVE 08/25/2019 0103   HGBUR NEGATIVE 08/25/2019 0103   BILIRUBINUR NEGATIVE 08/25/2019 0103   KETONESUR NEGATIVE 08/25/2019 0103   PROTEINUR NEGATIVE 08/25/2019 0103   NITRITE NEGATIVE 08/25/2019 0103   LEUKOCYTESUR NEGATIVE 08/25/2019 0103   Sepsis Labs: @LABRCNTIP (procalcitonin:4,lacticidven:4) ) Recent Results (from the past 240 hour(s))  SARS Coronavirus 2 by RT PCR (hospital order, performed in [facility] lab) Nasopharyngeal Nasopharyngeal Swab     Status: None   Collection Time: 03/01/20  6:20 PM   Specimen: Nasopharyngeal Swab  Result Value Ref Range Status   SARS  Coronavirus 2 NEGATIVE NEGATIVE Final    Comment: (NOTE) SARS-CoV-2 target nucleic acids are NOT DETECTED. The SARS-CoV-2 RNA is generally detectable in upper and lower respiratory specimens during the acute phase of infection. The lowest concentration of SARS-CoV-2 viral copies this assay can detect is 250 copies / mL. A negative result does not preclude SARS-CoV-2 infection and should not be used as the sole basis for treatment or other patient management decisions.  A negative result may occur with improper specimen collection / handling, submission of specimen other than nasopharyngeal swab, presence of viral mutation(s) within the areas targeted by this assay, and inadequate number of viral copies (<250 copies / mL). A negative result must be combined with clinical observations, patient history, and epidemiological information. Fact Sheet for Patients:   StrictlyIdeas.no Fact Sheet for Healthcare Providers: BankingDealers.co.za This test is not yet approved or cleared  by the [place] FDA and has been authorized for detection and/or diagnosis of SARS-CoV-2 by FDA under an Emergency Use Authorization (EUA).  This EUA will remain in effect (meaning this test can be used) for the duration of the COVID-19 declaration under Section 564(b)(1) of the Act, 21 U.S.C. section  360bbb-3(b)(1), unless the authorization is terminated or revoked sooner. Performed at [facility], [address] [place], [place] [address]   Expectorated sputum assessment w rflx to resp cult     Status: None   Collection Time: 03/03/20  9:00 AM   Specimen: Expectorated Sputum  Result Value Ref Range Status   Specimen Description EXPECTORATED SPUTUM  Final   Special Requests NONE  Final   Sputum evaluation   Final    THIS SPECIMEN IS ACCEPTABLE FOR SPUTUM CULTURE Performed at [facility], [address] [place], [place] [address]    Report Status 03/03/2020  FINAL  Final  Culture, respiratory     Status: None (Preliminary result)   Collection Time: 03/03/20  9:00 AM  Result Value Ref Range Status   Specimen Description   Final    EXPECTORATED SPUTUM Performed at [facility], [address] [place], [place] [address]    Special Requests   Final    NONE Reflexed from [id number] Performed at [facility], [address] [place], [place] [address]    Gram Stain   Final    FEW WBC PRESENT, PREDOMINANTLY PMN MODERATE GRAM POSITIVE COCCI IN PAIRS IN CHAINS FEW GRAM POSITIVE RODS    Culture   Final    CULTURE REINCUBATED FOR BETTER GROWTH Performed at [facility], [place] [address], [place], [place] [address]    Report Status PENDING  Incomplete     Scheduled Meds: . arformoterol  15 mcg Nebulization BID  . azithromycin  500 mg Oral Daily  . budesonide (PULMICORT) nebulizer solution  0.5 mg Nebulization BID  . enoxaparin (LOVENOX) injection  30 mg Subcutaneous QHS  . feeding supplement (ENSURE ENLIVE)  237 mL Oral BID BM  . FLUoxetine  40 mg Oral Daily  . guaiFENesin-dextromethorphan  10 mL Oral Q6H  . lisinopril  10 mg Oral Daily   And  . hydrochlorothiazide  12.5 mg Oral Daily  . ipratropium-albuterol  3 mL Nebulization Q6H  . methylPREDNISolone (SOLU-MEDROL) injection  60 mg Intravenous Q6H  . multivitamin with minerals  1 tablet Oral Daily  . pregabalin  200 mg Oral BID   Continuous Infusions:  Procedures/Studies: DG Chest Portable 1 View  Result Date: 03/01/2020 CLINICAL DATA:  Shortness of breath and wheezing for 3 days. EXAM: PORTABLE CHEST 1 VIEW COMPARISON:  Radiograph 09/29/2019, chest CT 04/28/2016 FINDINGS: Chronic hyperinflation and bronchial thickening. Normal heart size and mediastinal contours. Aortic atherosclerosis. Left basilar Bochdalek hernia. Presumed nipple shadows projecting over the lung bases. No focal airspace disease, pulmonary edema, pleural effusion or pneumothorax. No acute osseous abnormalities are  seen. IMPRESSION: 1. Chronic hyperinflation and bronchial thickening, consistent with COPD. No acute abnormality. 2. Presumed nipple shadows projecting over the lung bases, also seen on prior exam. Aortic Atherosclerosis (ICD10-I70.0). Electronically Signed   By: Keith Rake M.D.   On: 03/01/2020 17:36    Orson Eva, DO  [facility]  If 7PM-7AM, please contact night-coverage [email or web address] Password [email or web address] 03/04/2020, 5:57 PM   LOS: 2 days

## 2020-03-05 LAB — CULTURE, RESPIRATORY W GRAM STAIN: Culture: NORMAL

## 2020-03-05 NOTE — TOC Progression Note (Signed)
Transition of Care [facility]) - Progression Note   Patient Details  Name: Amy Rubio MRN: [id number] Date of Birth: 1950-12-18  Transition of Care [facility]) CM/SW [place], LCSW Phone Number: 03/05/2020, 2:04 PM  Clinical Narrative: TOC spoke with [facility] about O2. [facility] will deliver portable tank to hospital and will help patient with getting M6 portable O2 tank. CSW called patient and explained an O2 tank will be delivered to allow the patient to return home since she will be need to be on constant O2 and CA will follow up next week with the M6.  Readmission Risk Interventions Readmission Risk Prevention Plan 10/06/2019  Transportation Screening Complete  PCP or Specialist Appt within 5-7 Days Complete  Home Care Screening Complete  Medication Review (RN CM) Complete  Some recent data might be hidden

## 2020-03-05 NOTE — Progress Notes (Signed)
Amy Rubio NOM:[id number] DOB: [birth year] DOA: 03/01/2020 PCP: Redmond School, MD   Brief History: 69 year old female with a history of hypertension, chronic respiratory failure on 3 L, COPD, chronic pain, hyperlipidemia, depression, anxiety, tobacco abuse presenting with 3-day history of shortness of breath and increasing wheeze. She is complaining of increasing generalized weakness. Unfortunately, the patient is not been using her nebulizer machine as directed. She turned her oxygen up to 5 L without improvement. As result she presented for further evaluation. She has received both of her COVID-19 vaccinations. She denies any fevers, chills, chest pain, nausea, vomiting, diarrhea, domino pain.  Assessment/Plan: Acute on chronic respiratory failure with hypoxia -Secondary to COPD exacerbation -Presently stable on 4 L nasal cannula -Wean oxygen back to 3 L as tolerated -The patient has very little pulmonary reserve--minimal exertion causes significant shortness of breath -goal is to keep oxygenation 88-92%  COPD exacerbation -Increased Pulmicort -continue Brovana -Continue duo nebs -Continue IV Solu-Medrol -The patient has a degree of vocal cord dysfunction causing her wheezing -Flutter valve -Continue Robitussin  Severe protein calorie malnutrition -Continue supplements  Chronic pain syndrome -Continue home dose of Percocet  Essential hypertension -Continue lisinopril/HCTZ  Depression/anxiety -Continue home dose of fluoxetine and alprazolam   GOC -Advance care planning, including the explanation and discussion of advance directives was carried out with the patient and family. Code status including explanations of "Full Code" and "DNR" and alternatives were discussed in detail. Discussion of end-of-life issues including but not limited palliative care, hospice care and the concept of hospice, other end-of-life care options, power  of attorney for health care decisions, living wills, and physician orders for life-sustaining treatment were also discussed with the patient and family. Total face to face time 16 minutes. -DNR confirmed with pt   Status is: Inpatient  Remains inpatient appropriate because:IV treatments appropriate due to intensity of illness or inability to take PO. Dyspneic with minimal exertion requiring IV steroids and duonebs   Dispo: The patient is from:Home Anticipated d/c is JG:GEZM Anticipated d/c date is: 2 days Patient currentlyis not medically stable to d/c.         Family Communication:noFamily at bedside  Consultants:none  Code Status: DNR  DVT Prophylaxis: [place] Lovenox   Procedures: As Listed in Amy Note Above  Antibiotics: levoflox6/1>>6/2    Subjective: Pt is slowly breathing better.  She is frustrated with slow improvement.  Denies cp, f/c, n/v/d.  She has cough without hemoptysis.  Objective: Vitals:   03/05/20 0438 03/05/20 0739 03/05/20 1405 03/05/20 1423  BP: (!) 122/59   (!) 127/57  Pulse: (!) 57   87  Resp: 18   19  Temp: (!) 97.5 F (36.4 C)   98.9 F (37.2 C)  TempSrc: Oral   Oral  SpO2: 100% 95% 93% 94%  Weight:      Height:        Intake/Output Summary (Last 24 hours) at 03/05/2020 1702 Last data filed at 03/05/2020 0900 Gross per 24 hour  Intake 1200 ml  Output --  Net 1200 ml   Weight change:  Exam:   General:  Pt is alert, follows commands appropriately, not in acute distress  HEENT: No icterus, No thrush, No neck mass, [place]/AT  Cardiovascular: RRR, S1/S2, no rubs, no gallops  Respiratory: bilateral exp wheeze.  Diminished breath sounds.  Bilateral rales  Abdomen: Soft/+BS, non tender, non distended, no guarding  Extremities: No edema, No lymphangitis,  No petechiae, No rashes, no synovitis   Data Reviewed: I have personally reviewed following labs and  imaging studies Basic Metabolic Panel: Recent Labs  Lab 03/01/20 1758 03/02/20 0446 03/04/20 0505  NA 133* 135 135  K 3.6 4.0 3.5  CL 92* 93* 91*  CO2 30 31 34*  GLUCOSE 101* 157* 149*  BUN 15 20 23   CREATININE 0.49 0.59 0.60  CALCIUM 9.0 9.3 9.1  MG  --  1.8 2.2   Liver Function Tests: Recent Labs  Lab 03/02/20 0446  AST 13*  ALT 10  ALKPHOS 54  BILITOT 0.5  PROT 7.1  ALBUMIN 3.9   No results for input(s): LIPASE, AMYLASE in the last 168 hours. No results for input(s): AMMONIA in the last 168 hours. Coagulation Profile: No results for input(s): INR, PROTIME in the last 168 hours. CBC: Recent Labs  Lab 03/01/20 1758 03/02/20 0446 03/04/20 0505  WBC 4.0 1.7* 5.5  NEUTROABS 2.6  --  4.9  HGB 12.0 12.2 10.2*  HCT 37.9 38.1 32.8*  MCV 103.3* 101.9* 103.5*  PLT 266 292 298   Cardiac Enzymes: No results for input(s): CKTOTAL, CKMB, CKMBINDEX, TROPONINI in the last 168 hours. BNP: Invalid input(s): POCBNP CBG: No results for input(s): GLUCAP in the last 168 hours. HbA1C: No results for input(s): HGBA1C in the last 72 hours. Urine analysis:    Component Value Date/Time   COLORURINE STRAW (A) 08/25/2019 0103   APPEARANCEUR CLEAR 08/25/2019 0103   LABSPEC 1.003 (L) 08/25/2019 0103   PHURINE 7.0 08/25/2019 0103   GLUCOSEU NEGATIVE 08/25/2019 0103   HGBUR NEGATIVE 08/25/2019 0103   BILIRUBINUR NEGATIVE 08/25/2019 0103   KETONESUR NEGATIVE 08/25/2019 0103   PROTEINUR NEGATIVE 08/25/2019 0103   NITRITE NEGATIVE 08/25/2019 0103   LEUKOCYTESUR NEGATIVE 08/25/2019 0103   Sepsis Labs: @LABRCNTIP (procalcitonin:4,lacticidven:4) ) Recent Results (from the past 240 hour(s))  SARS Coronavirus 2 by RT PCR (hospital order, performed in [place] hospital lab) Nasopharyngeal Nasopharyngeal Swab     Status: None   Collection Time: 03/01/20  6:20 PM   Specimen: Nasopharyngeal Swab  Result Value Ref Range Status   SARS Coronavirus 2 NEGATIVE NEGATIVE Final     Comment: (NOTE) SARS-CoV-2 target nucleic acids are NOT DETECTED. The SARS-CoV-2 RNA is generally detectable in upper and lower respiratory specimens during the acute phase of infection. The lowest concentration of SARS-CoV-2 viral copies this assay can detect is 250 copies / mL. A negative result does not preclude SARS-CoV-2 infection and should not be used as the sole basis for treatment or other patient management decisions.  A negative result may occur with improper specimen collection / handling, submission of specimen other than nasopharyngeal swab, presence of viral mutation(s) within the areas targeted by this assay, and inadequate number of viral copies (<250 copies / mL). A negative result must be combined with clinical observations, patient history, and epidemiological information. Fact Sheet for Patients:   StrictlyIdeas.no Fact Sheet for Healthcare Providers: BankingDealers.co.za This test is not yet approved or cleared  by the [place] FDA and has been authorized for detection and/or diagnosis of SARS-CoV-2 by FDA under an Emergency Use Authorization (EUA).  This EUA will remain in effect (meaning this test can be used) for the duration of the COVID-19 declaration under Section 564(b)(1) of the Act, 21 U.S.C. section 360bbb-3(b)(1), unless the authorization is terminated or revoked sooner. Performed at [facility], [address], [place], [place] [address]   Expectorated sputum assessment w rflx to resp cult  Status: None   Collection Time: 03/03/20  9:00 AM   Specimen: Expectorated Sputum  Result Value Ref Range Status   Specimen Description EXPECTORATED SPUTUM  Final   Special Requests NONE  Final   Sputum evaluation   Final    THIS SPECIMEN IS ACCEPTABLE FOR SPUTUM CULTURE Performed at [facility], [address], [place], [place] [address]    Report Status 03/03/2020 FINAL  Final  Culture, respiratory      Status: None   Collection Time: 03/03/20  9:00 AM  Result Value Ref Range Status   Specimen Description   Final    EXPECTORATED SPUTUM Performed at [facility], [address], [place], [place] [address]    Special Requests   Final    NONE Reflexed from [id number] Performed at [facility], [address], [place], [place] [address]    Gram Stain   Final    FEW WBC PRESENT, PREDOMINANTLY PMN MODERATE GRAM POSITIVE COCCI IN PAIRS IN CHAINS FEW GRAM POSITIVE RODS    Culture   Final    ABUNDANT Consistent with normal respiratory flora. Performed at [facility], [place] [address], [place], [place] [address]    Report Status 03/05/2020 FINAL  Final     Scheduled Meds: . arformoterol  15 mcg Nebulization BID  . azithromycin  500 mg Oral Daily  . budesonide (PULMICORT) nebulizer solution  0.5 mg Nebulization BID  . enoxaparin (LOVENOX) injection  30 mg Subcutaneous QHS  . feeding supplement (ENSURE ENLIVE)  237 mL Oral BID BM  . FLUoxetine  40 mg Oral Daily  . guaiFENesin-dextromethorphan  10 mL Oral Q6H  . lisinopril  10 mg Oral Daily   And  . hydrochlorothiazide  12.5 mg Oral Daily  . ipratropium-albuterol  3 mL Nebulization Q6H  . methylPREDNISolone (SOLU-MEDROL) injection  60 mg Intravenous Q6H  . multivitamin with minerals  1 tablet Oral Daily  . pregabalin  200 mg Oral BID   Continuous Infusions:  Procedures/Studies: DG Chest Portable 1 View  Result Date: 03/01/2020 CLINICAL DATA:  Shortness of breath and wheezing for 3 days. EXAM: PORTABLE CHEST 1 VIEW COMPARISON:  Radiograph 09/29/2019, chest CT 04/28/2016 FINDINGS: Chronic hyperinflation and bronchial thickening. Normal heart size and mediastinal contours. Aortic atherosclerosis. Left basilar Bochdalek hernia. Presumed nipple shadows projecting over the lung bases. No focal airspace disease, pulmonary edema, pleural effusion or pneumothorax. No acute osseous abnormalities are seen. IMPRESSION: 1. Chronic hyperinflation  and bronchial thickening, consistent with COPD. No acute abnormality. 2. Presumed nipple shadows projecting over the lung bases, also seen on prior exam. Aortic Atherosclerosis (ICD10-I70.0). Electronically Signed   By: Keith Rake M.D.   On: 03/01/2020 17:36    Orson Eva, DO  [facility]  If 7PM-7AM, please contact night-coverage [email or web address] Password [email or web address] 03/05/2020, 5:02 PM   LOS: 3 days

## 2020-03-05 NOTE — Care Management Important Message (Signed)
Important Message  Patient Details  Name: Amy Rubio MRN: [id number] Date of Birth: 1950/10/15   Medicare Important Message Given:  Yes     [name] 03/05/2020, 1:07 PM

## 2020-03-06 LAB — BASIC METABOLIC PANEL
Anion gap: 9 (ref 5–15)
BUN: 24 mg/dL — ABNORMAL HIGH (ref 8–23)
CO2: 35 mmol/L — ABNORMAL HIGH (ref 22–32)
Calcium: 8.7 mg/dL — ABNORMAL LOW (ref 8.9–10.3)
Chloride: 92 mmol/L — ABNORMAL LOW (ref 98–111)
Creatinine, Ser: 0.75 mg/dL (ref 0.44–1.00)
GFR calc Af Amer: >60 mL/min (ref >60)
GFR calc non Af Amer: >60 mL/min (ref >60)
Glucose, Bld: 132 mg/dL — ABNORMAL HIGH (ref 70–99)
Potassium: 4.1 mmol/L (ref 3.5–5.1)
Sodium: 136 mmol/L (ref 135–145)

## 2020-03-06 LAB — PHOSPHORUS: Phosphorus: 3.4 mg/dL (ref 2.5–4.6)

## 2020-03-06 LAB — MAGNESIUM: Magnesium: 2.1 mg/dL (ref 1.7–2.4)

## 2020-03-06 MED ORDER — HYDROCODONE-HOMATROPINE 5-1.5 MG/5ML PO SYRP
5.0000 mL | ORAL_SOLUTION | ORAL | Status: DC | PRN
  Administered 2020-03-06 – 2020-03-11 (×2): 5 mL via ORAL
  Filled 2020-03-06 (×2): qty 5

## 2020-03-06 MED ORDER — AMLODIPINE BESYLATE 5 MG PO TABS
5.0000 mg | ORAL_TABLET | Freq: Every day | ORAL | Status: DC
  Administered 2020-03-07 – 2020-03-12 (×6): 5 mg via ORAL
  Filled 2020-03-06 (×6): qty 1

## 2020-03-06 NOTE — Progress Notes (Signed)
PROGRESS NOTE  Amy Rubio RJJ:[id number] DOB: 03-17-1951 DOA: 03/01/2020 PCP: Redmond School, MD  Brief History: 69 year old female with a history of hypertension, chronic respiratory failure on 3 L, COPD, chronic pain, hyperlipidemia, depression, anxiety, tobacco abuse presenting with 3-day history of shortness of breath and increasing wheeze. She is complaining of increasing generalized weakness. Unfortunately, the patient is not been using her nebulizer machine as directed. She turned her oxygen up to 5 L without improvement. As result she presented for further evaluation. She has received both of her COVID-19 vaccinations. She denies any fevers, chills, chest pain, nausea, vomiting, diarrhea, domino pain.  Assessment/Plan: Acute on chronic respiratory failure with hypoxia -Secondary to COPD exacerbation -Presently stable on 3 L nasal cannula -The patient has very little pulmonary reserve--minimal exertion causes significant shortness of breath -goal is to keep oxygenation 88-92%  COPD exacerbation -d/c pulmicort -continueBrovana -Continueduo nebs -Continue IV Solu-Medrol -The patient has a degree of vocal cord dysfunction causing her wheezing -Flutter valve -Continue Robitussin  Severe protein calorie malnutrition -Continue supplements  Chronic pain syndrome -Continue home dose of Percocet  Essential hypertension -d/c lisinopril -add amlodipine  Depression/anxiety -Continue home dose of fluoxetine and alprazolam   GOC -Advance care planning, including the explanation and discussion of advance directives was carried out with the patient and family. Code status including explanations of "Full Code" and "DNR" and alternatives were discussed in detail. Discussion of end-of-life issues including but not limited palliative care, hospice care and the concept of hospice, other end-of-life care options, power of attorney for health care decisions,  living wills, and physician orders for life-sustaining treatment were also discussed with the patient and family. Total face to face time 16 minutes. -DNR confirmed with pt   Status is: Inpatient  Remains inpatient appropriate because:IV treatments appropriate due to intensity of illness or inability to take PO. Dyspneic with minimal exertion requiring IV steroids and duonebs   Dispo: The patient is from:Home Anticipated d/c is KZ:SWFU Anticipated d/c date is: 2 days Patient currentlyis not medically stable to d/c.         Family Communication:noFamily at bedside  Consultants:none  Code Status: DNR  DVT Prophylaxis:  Lovenox   Procedures: As Listed in Progress Note Above  Antibiotics: levoflox6/1>>6/2     Subjective: Overall sob is slowly improving, but remains sob with minimal exertion.  Denies f/c, cp, n/v/d.  Still has nonproductive cough.  No hemoptysis  Objective: Vitals:   03/06/20 0803 03/06/20 0804 03/06/20 1333 03/06/20 1502  BP:    (!) 160/67  Pulse:    72  Resp:    18  Temp:    98 F (36.7 C)  TempSrc:    Oral  SpO2: 92% 92% 94% 94%  Weight:      Height:        Intake/Output Summary (Last 24 hours) at 03/06/2020 1649 Last data filed at 03/06/2020 1500 Gross per 24 hour  Intake 960 ml  Output --  Net 960 ml   Weight change:  Exam:   General:  Pt is alert, follows commands appropriately, not in acute distress  HEENT: No icterus, No thrush, No neck mass, [place]/AT  Cardiovascular: RRR, S1/S2, no rubs, no gallops  Respiratory: bilateral wheeze.  Bilateral rhonchi  Abdomen: Soft/+BS, non tender, non distended, no guarding  Extremities: No edema, No lymphangitis, No petechiae, No rashes, no synovitis   Data Reviewed: I have personally reviewed following labs and imaging  studies Basic Metabolic Panel: Recent Labs  Lab 03/01/20 1758 03/02/20 0446 03/04/20 0505  03/06/20 0659  NA 133* 135 135 136  K 3.6 4.0 3.5 4.1  CL 92* 93* 91* 92*  CO2 30 31 34* 35*  GLUCOSE 101* 157* 149* 132*  BUN 15 20 23  24*  CREATININE 0.49 0.59 0.60 0.75  CALCIUM 9.0 9.3 9.1 8.7*  MG  --  1.8 2.2 2.1  PHOS  --   --   --  3.4   Liver Function Tests: Recent Labs  Lab 03/02/20 0446  AST 13*  ALT 10  ALKPHOS 54  BILITOT 0.5  PROT 7.1  ALBUMIN 3.9   No results for input(s): LIPASE, AMYLASE in the last 168 hours. No results for input(s): AMMONIA in the last 168 hours. Coagulation Profile: No results for input(s): INR, PROTIME in the last 168 hours. CBC: Recent Labs  Lab 03/01/20 1758 03/02/20 0446 03/04/20 0505  WBC 4.0 1.7* 5.5  NEUTROABS 2.6  --  4.9  HGB 12.0 12.2 10.2*  HCT 37.9 38.1 32.8*  MCV 103.3* 101.9* 103.5*  PLT 266 292 298   Cardiac Enzymes: No results for input(s): CKTOTAL, CKMB, CKMBINDEX, TROPONINI in the last 168 hours. BNP: Invalid input(s): POCBNP CBG: No results for input(s): GLUCAP in the last 168 hours. HbA1C: No results for input(s): HGBA1C in the last 72 hours. Urine analysis:    Component Value Date/Time   COLORURINE STRAW (A) 08/25/2019 0103   APPEARANCEUR CLEAR 08/25/2019 0103   LABSPEC 1.003 (L) 08/25/2019 0103   PHURINE 7.0 08/25/2019 0103   GLUCOSEU NEGATIVE 08/25/2019 0103   HGBUR NEGATIVE 08/25/2019 0103   BILIRUBINUR NEGATIVE 08/25/2019 0103   KETONESUR NEGATIVE 08/25/2019 0103   PROTEINUR NEGATIVE 08/25/2019 0103   NITRITE NEGATIVE 08/25/2019 0103   LEUKOCYTESUR NEGATIVE 08/25/2019 0103   Sepsis Labs: @LABRCNTIP (procalcitonin:4,lacticidven:4) ) Recent Results (from the past 240 hour(s))  SARS Coronavirus 2 by RT PCR (hospital order, performed in [place] hospital lab) Nasopharyngeal Nasopharyngeal Swab     Status: None   Collection Time: 03/01/20  6:20 PM   Specimen: Nasopharyngeal Swab  Result Value Ref Range Status   SARS Coronavirus 2 NEGATIVE NEGATIVE Final    Comment: (NOTE) SARS-CoV-2  target nucleic acids are NOT DETECTED. The SARS-CoV-2 RNA is generally detectable in upper and lower respiratory specimens during the acute phase of infection. The lowest concentration of SARS-CoV-2 viral copies this assay can detect is 250 copies / mL. A negative result does not preclude SARS-CoV-2 infection and should not be used as the sole basis for treatment or other patient management decisions.  A negative result may occur with improper specimen collection / handling, submission of specimen other than nasopharyngeal swab, presence of viral mutation(s) within the areas targeted by this assay, and inadequate number of viral copies (<250 copies / mL). A negative result must be combined with clinical observations, patient history, and epidemiological information. Fact Sheet for Patients:   StrictlyIdeas.no Fact Sheet for Healthcare Providers: BankingDealers.co.za This test is not yet approved or cleared  by the [place] FDA and has been authorized for detection and/or diagnosis of SARS-CoV-2 by FDA under an Emergency Use Authorization (EUA).  This EUA will remain in effect (meaning this test can be used) for the duration of the COVID-19 declaration under Section 564(b)(1) of the Act, 21 U.S.C. section 360bbb-3(b)(1), unless the authorization is terminated or revoked sooner. Performed at [facility], [address], [place], [place] [address]   Expectorated sputum assessment  w rflx to resp cult     Status: None   Collection Time: 03/03/20  9:00 AM   Specimen: Expectorated Sputum  Result Value Ref Range Status   Specimen Description EXPECTORATED SPUTUM  Final   Special Requests NONE  Final   Sputum evaluation   Final    THIS SPECIMEN IS ACCEPTABLE FOR SPUTUM CULTURE Performed at [facility], [address], [place], [place] [address]    Report Status 03/03/2020 FINAL  Final  Culture, respiratory     Status: None   Collection  Time: 03/03/20  9:00 AM  Result Value Ref Range Status   Specimen Description   Final    EXPECTORATED SPUTUM Performed at [facility], [address], [place], [place] [address]    Special Requests   Final    NONE Reflexed from [id number] Performed at [facility], [address], [place], [place] [address]    Gram Stain   Final    FEW WBC PRESENT, PREDOMINANTLY PMN MODERATE GRAM POSITIVE COCCI IN PAIRS IN CHAINS FEW GRAM POSITIVE RODS    Culture   Final    ABUNDANT Consistent with normal respiratory flora. Performed at [facility], [place] [address] [place], [place] [address]    Report Status 03/05/2020 FINAL  Final     Scheduled Meds: . [START ON 03/07/2020] amLODipine  5 mg Oral Daily  . arformoterol  15 mcg Nebulization BID  . azithromycin  500 mg Oral Daily  . enoxaparin (LOVENOX) injection  30 mg Subcutaneous QHS  . feeding supplement (ENSURE ENLIVE)  237 mL Oral BID BM  . FLUoxetine  40 mg Oral Daily  . guaiFENesin-dextromethorphan  10 mL Oral Q6H  . hydrochlorothiazide  12.5 mg Oral Daily  . ipratropium-albuterol  3 mL Nebulization Q6H  . methylPREDNISolone (SOLU-MEDROL) injection  60 mg Intravenous Q6H  . multivitamin with minerals  1 tablet Oral Daily  . pregabalin  200 mg Oral BID   Continuous Infusions:  Procedures/Studies: DG Chest Portable 1 View  Result Date: 03/01/2020 CLINICAL DATA:  Shortness of breath and wheezing for 3 days. EXAM: PORTABLE CHEST 1 VIEW COMPARISON:  Radiograph 09/29/2019, chest CT 04/28/2016 FINDINGS: Chronic hyperinflation and bronchial thickening. Normal heart size and mediastinal contours. Aortic atherosclerosis. Left basilar Bochdalek hernia. Presumed nipple shadows projecting over the lung bases. No focal airspace disease, pulmonary edema, pleural effusion or pneumothorax. No acute osseous abnormalities are seen. IMPRESSION: 1. Chronic hyperinflation and bronchial thickening, consistent with COPD. No acute abnormality. 2. Presumed nipple  shadows projecting over the lung bases, also seen on prior exam. Aortic Atherosclerosis (ICD10-I70.0). Electronically Signed   By: Keith Rake M.D.   On: 03/01/2020 17:36    Orson Eva, DO  [facility]  If 7PM-7AM, please contact night-coverage [email or web address] Password [email or web address] 03/06/2020, 4:49 PM   LOS: 4 days

## 2020-03-07 MED ORDER — METHYLPREDNISOLONE SODIUM SUCC 125 MG IJ SOLR
60.0000 mg | Freq: Two times a day (BID) | INTRAMUSCULAR | Status: DC
  Administered 2020-03-07 – 2020-03-08 (×2): 60 mg via INTRAVENOUS
  Filled 2020-03-07 (×2): qty 2

## 2020-03-07 NOTE — Progress Notes (Signed)
PROGRESS NOTE  Amy Rubio ZRA:[id number] DOB: [birth year] DOA: 03/01/2020 PCP: Redmond School, MD   Brief History: 69 year old female with a history of hypertension, chronic respiratory failure on 3 L, COPD, chronic pain, hyperlipidemia, depression, anxiety, tobacco abuse presenting with 3-day history of shortness of breath and increasing wheeze. She is complaining of increasing generalized weakness. Unfortunately, the patient is not been using her nebulizer machine as directed. She turned her oxygen up to 5 L without improvement. As result she presented for further evaluation. She has received both of her COVID-19 vaccinations. She denies any fevers, chills, chest pain, nausea, vomiting, diarrhea, domino pain.  Assessment/Plan: Acute on chronic respiratory failure with hypoxia -Secondary to COPD exacerbation -Presently stable on 3 L nasal cannula -The patient has very little pulmonary reserve--minimal exertion causes significant shortness of breath -goal is to keep oxygenation 88-92%  COPD exacerbation -d/c pulmicort -continueBrovana -Continueduo nebs -Continue IV Solu-Medrol-->wean -The patient has a degree of vocal cord dysfunction causing her wheezing -Flutter valve -Continue Robitussin/hycodan  Severe protein calorie malnutrition -Continue supplements  Chronic pain syndrome -Continue home dose of Percocet  Essential hypertension -d/c lisinopril -add amlodipine  Depression/anxiety -Continue home dose of fluoxetine and alprazolam   GOC -Advance care planning, including the explanation and discussion of advance directives was carried out with the patient and family. Code status including explanations of "Full Code" and "DNR" and alternatives were discussed in detail. Discussion of end-of-life issues including but not limited palliative care, hospice care and the concept of hospice, other end-of-life care options, power of attorney for health  care decisions, living wills, and physician orders for life-sustaining treatment were also discussed with the patient and family. Total face to face time 16 minutes. -DNR confirmed with pt   Status is: Inpatient  Remains inpatient appropriate because:IV treatments appropriate due to intensity of illness or inability to take PO. Dyspneic with minimal exertion requiring IV steroids and duonebs   Dispo: The patient is from:Home Anticipated d/c is LK:TGYB Anticipated d/c date is: 2 days Patient currentlyis not medically stable to d/c.         Family Communication:noFamily at bedside  Consultants:none  Code Status: DNR  DVT Prophylaxis: [place] Lovenox   Procedures: As Listed in Progress Note Above  Antibiotics: levoflox6/1>>6/2     Subjective: Pt complains of sob with mild exertion.  Denies f,c, cp, n/v/d  Objective: Vitals:   03/07/20 0856 03/07/20 1255 03/07/20 1256 03/07/20 1441  BP:    (!) 151/56  Pulse:  72  70  Resp:  18  20  Temp:    98.1 F (36.7 C)  TempSrc:    Oral  SpO2: 98% 94% 94% 100%  Weight:      Height:        Intake/Output Summary (Last 24 hours) at 03/07/2020 1704 Last data filed at 03/06/2020 1804 Gross per 24 hour  Intake 240 ml  Output --  Net 240 ml   Weight change:  Exam:   General:  Pt is alert, follows commands appropriately, not in acute distress  HEENT: No icterus, No thrush, No neck mass, [place]/AT  Cardiovascular: RRR, S1/S2, no rubs, no gallops  Respiratory: bibasilar rales. Bilateral wheeze.  Diminished breath sounds bilateral  Abdomen: Soft/+BS, non tender, non distended, no guarding  Extremities: No edema, No lymphangitis, No petechiae, No rashes, no synovitis   Data Reviewed: I have personally reviewed following labs and imaging studies Basic Metabolic Panel: Recent Labs  Lab 03/01/20 1758 03/02/20 0446 03/04/20 0505 03/06/20 0659  NA  133* 135 135 136  K 3.6 4.0 3.5 4.1  CL 92* 93* 91* 92*  CO2 30 31 34* 35*  GLUCOSE 101* 157* 149* 132*  BUN 15 20 23  24*  CREATININE 0.49 0.59 0.60 0.75  CALCIUM 9.0 9.3 9.1 8.7*  MG  --  1.8 2.2 2.1  PHOS  --   --   --  3.4   Liver Function Tests: Recent Labs  Lab 03/02/20 0446  AST 13*  ALT 10  ALKPHOS 54  BILITOT 0.5  PROT 7.1  ALBUMIN 3.9   No results for input(s): LIPASE, AMYLASE in the last 168 hours. No results for input(s): AMMONIA in the last 168 hours. Coagulation Profile: No results for input(s): INR, PROTIME in the last 168 hours. CBC: Recent Labs  Lab 03/01/20 1758 03/02/20 0446 03/04/20 0505  WBC 4.0 1.7* 5.5  NEUTROABS 2.6  --  4.9  HGB 12.0 12.2 10.2*  HCT 37.9 38.1 32.8*  MCV 103.3* 101.9* 103.5*  PLT 266 292 298   Cardiac Enzymes: No results for input(s): CKTOTAL, CKMB, CKMBINDEX, TROPONINI in the last 168 hours. BNP: Invalid input(s): POCBNP CBG: No results for input(s): GLUCAP in the last 168 hours. HbA1C: No results for input(s): HGBA1C in the last 72 hours. Urine analysis:    Component Value Date/Time   COLORURINE STRAW (A) 08/25/2019 0103   APPEARANCEUR CLEAR 08/25/2019 0103   LABSPEC 1.003 (L) 08/25/2019 0103   PHURINE 7.0 08/25/2019 0103   GLUCOSEU NEGATIVE 08/25/2019 0103   HGBUR NEGATIVE 08/25/2019 0103   BILIRUBINUR NEGATIVE 08/25/2019 0103   KETONESUR NEGATIVE 08/25/2019 0103   PROTEINUR NEGATIVE 08/25/2019 0103   NITRITE NEGATIVE 08/25/2019 0103   LEUKOCYTESUR NEGATIVE 08/25/2019 0103   Sepsis Labs: @LABRCNTIP (procalcitonin:4,lacticidven:4) ) Recent Results (from the past 240 hour(s))  SARS Coronavirus 2 by RT PCR (hospital order, performed in [place] hospital lab) Nasopharyngeal Nasopharyngeal Swab     Status: None   Collection Time: 03/01/20  6:20 PM   Specimen: Nasopharyngeal Swab  Result Value Ref Range Status   SARS Coronavirus 2 NEGATIVE NEGATIVE Final    Comment: (NOTE) SARS-CoV-2 target nucleic acids  are NOT DETECTED. The SARS-CoV-2 RNA is generally detectable in upper and lower respiratory specimens during the acute phase of infection. The lowest concentration of SARS-CoV-2 viral copies this assay can detect is 250 copies / mL. A negative result does not preclude SARS-CoV-2 infection and should not be used as the sole basis for treatment or other patient management decisions.  A negative result may occur with improper specimen collection / handling, submission of specimen other than nasopharyngeal swab, presence of viral mutation(s) within the areas targeted by this assay, and inadequate number of viral copies (<250 copies / mL). A negative result must be combined with clinical observations, patient history, and epidemiological information. Fact Sheet for Patients:   StrictlyIdeas.no Fact Sheet for Healthcare Providers: BankingDealers.co.za This test is not yet approved or cleared  by the [place] FDA and has been authorized for detection and/or diagnosis of SARS-CoV-2 by FDA under an Emergency Use Authorization (EUA).  This EUA will remain in effect (meaning this test can be used) for the duration of the COVID-19 declaration under Section 564(b)(1) of the Act, 21 U.S.C. section 360bbb-3(b)(1), unless the authorization is terminated or revoked sooner. Performed at [facility], [address] [place], [place] [address]   Expectorated sputum assessment w rflx to resp cult  Status: None   Collection Time: 03/03/20  9:00 AM   Specimen: Expectorated Sputum  Result Value Ref Range Status   Specimen Description EXPECTORATED SPUTUM  Final   Special Requests NONE  Final   Sputum evaluation   Final    THIS SPECIMEN IS ACCEPTABLE FOR SPUTUM CULTURE Performed at [facility], [address], [place], [place] [address]    Report Status 03/03/2020 FINAL  Final  Culture, respiratory     Status: None   Collection Time: 03/03/20  9:00 AM   Result Value Ref Range Status   Specimen Description   Final    EXPECTORATED SPUTUM Performed at [facility], [address] [place], [place] [address]    Special Requests   Final    NONE Reflexed from [id number] Performed at [facility], [address], [place], [place] [address]    Gram Stain   Final    FEW WBC PRESENT, PREDOMINANTLY PMN MODERATE GRAM POSITIVE COCCI IN PAIRS IN CHAINS FEW GRAM POSITIVE RODS    Culture   Final    ABUNDANT Consistent with normal respiratory flora. Performed at [facility], [place] [address] [place], [place] [address]    Report Status 03/05/2020 FINAL  Final     Scheduled Meds: . amLODipine  5 mg Oral Daily  . arformoterol  15 mcg Nebulization BID  . azithromycin  500 mg Oral Daily  . enoxaparin (LOVENOX) injection  30 mg Subcutaneous QHS  . feeding supplement (ENSURE ENLIVE)  237 mL Oral BID BM  . FLUoxetine  40 mg Oral Daily  . guaiFENesin-dextromethorphan  10 mL Oral Q6H  . hydrochlorothiazide  12.5 mg Oral Daily  . ipratropium-albuterol  3 mL Nebulization Q6H  . methylPREDNISolone (SOLU-MEDROL) injection  60 mg Intravenous Q6H  . multivitamin with minerals  1 tablet Oral Daily  . pregabalin  200 mg Oral BID   Continuous Infusions:  Procedures/Studies: DG Chest Portable 1 View  Result Date: 03/01/2020 CLINICAL DATA:  Shortness of breath and wheezing for 3 days. EXAM: PORTABLE CHEST 1 VIEW COMPARISON:  Radiograph 09/29/2019, chest CT 04/28/2016 FINDINGS: Chronic hyperinflation and bronchial thickening. Normal heart size and mediastinal contours. Aortic atherosclerosis. Left basilar Bochdalek hernia. Presumed nipple shadows projecting over the lung bases. No focal airspace disease, pulmonary edema, pleural effusion or pneumothorax. No acute osseous abnormalities are seen. IMPRESSION: 1. Chronic hyperinflation and bronchial thickening, consistent with COPD. No acute abnormality. 2. Presumed nipple shadows projecting over the lung bases,  also seen on prior exam. Aortic Atherosclerosis (ICD10-I70.0). Electronically Signed   By: Keith Rake M.D.   On: 03/01/2020 17:36    Orson Eva, DO  [facility]  If 7PM-7AM, please contact night-coverage [email or web address] Password [email or web address] 03/07/2020, 5:04 PM   LOS: 5 days

## 2020-03-07 NOTE — Progress Notes (Signed)
   03/07/20 1441  What Happened  Was fall witnessed? No  Was patient injured? No  Patient found on floor  Found by Staff-comment Jacques Navy RN, Thayer Headings NT)  Stated prior activity ambulating-unassisted  Follow Up  MD notified Dr. Carles Collet  Time MD notified 1526  Family notified No - patient refusal  Additional tests No  Progress note created (see row info) Yes  Adult Fall Risk Assessment  Risk Factor Category (scoring not indicated) Fall has occurred during this admission (document High fall risk);High fall risk per protocol (document High fall risk)  Age 69  Fall History: Fall within 6 months prior to admission 0  Elimination; Bowel and/or Urine Incontinence 0  Elimination; Bowel and/or Urine Urgency/Frequency 0  Medications: includes PCA/Opiates, Anti-convulsants, Anti-hypertensives, Diuretics, Hypnotics, Laxatives, Sedatives, and Psychotropics 5  Patient Care Equipment 1  Mobility-Assistance 2  Mobility-Gait 2  Mobility-Sensory Deficit 0  Altered awareness of immediate physical environment 0  Impulsiveness 0  Lack of understanding of one's physical/cognitive limitations 0  Total Score 11  Patient Fall Risk Level High fall risk  Adult Fall Risk Interventions  Required Bundle Interventions *See Row Information* High fall risk - low, moderate, and high requirements implemented  Additional Interventions Use of appropriate toileting equipment (bedpan, BSC, etc.)  Screening for Fall Injury Risk (To be completed on HIGH fall risk patients) - Assessing Need for Low Bed  Risk For Fall Injury- Low Bed Criteria None identified - Continue screening  Will Implement Low Bed and Floor Mats Low bed contraindicated, floor mats in place (refuse bed alarm)  Specialty Low Bed Contraindicated  (refused )  Screening for Fall Injury Risk (To be completed on HIGH fall risk patients who do not meet crieteria for Low Bed) - Assessing Need for Floor Mats Only  Risk For Fall Injury- Criteria  for Floor Mats Noncompliant with safety precautions  Will Implement Floor Mats Yes  Vitals  Temp 98.1 F (36.7 C)  Temp Source Oral  BP (!) 151/56  MAP (mmHg) 81  BP Location Right Arm  BP Method Automatic  Patient Position (if appropriate) Lying  Pulse Rate 70  Resp 20  Oxygen Therapy  SpO2 100 %  O2 Device Nasal Cannula  O2 Flow Rate (L/min) 8 L/min  Pain Assessment  Pain Scale 0-10  Pain Score 5  Pain Type Chronic pain  Pain Location Generalized  Neurological  Neuro (WDL) WDL  Level of Consciousness Alert  Musculoskeletal  Musculoskeletal (WDL) X  Assistive Device Cane  Generalized Weakness Yes  Weight Bearing Restrictions No  Pain Assessment  Date Pain First Started  (prior to admission, "post-polio syndrome" per patient)  Result of Injury No  Pain Screening  Clinical Progression Not changed  Pain Assessment  Work-Related Injury No   Patient found in floor on hands and knees attempting to ambulate without assistance. Patient has been refusing bed alarm and has told staff she knows how to cut it off, patient has been educated on risks and verbalizes understanding of risks of ambulating without assistance. Dr. Carles Collet made aware. No injuries noted. VSS. Bed alarm encouraged and patient continues to refuse. Risks of falling explained again to patient, verbalized understanding. Will continue to monitor.

## 2020-03-08 DIAGNOSIS — J441 Chronic obstructive pulmonary disease with (acute) exacerbation: Principal | ICD-10-CM

## 2020-03-08 LAB — CREATININE, SERUM
Creatinine, Ser: 0.59 mg/dL (ref 0.44–1.00)
GFR calc Af Amer: >60 mL/min (ref >60)
GFR calc non Af Amer: >60 mL/min (ref >60)

## 2020-03-08 MED ORDER — SODIUM CHLORIDE 3 % IN NEBU: 4.0000 mL | INHALATION_SOLUTION | Freq: Two times a day (BID) | RESPIRATORY_TRACT | Status: DC

## 2020-03-08 MED ORDER — BUDESONIDE 0.5 MG/2ML IN SUSP
0.5000 mg | Freq: Two times a day (BID) | RESPIRATORY_TRACT | Status: DC
  Administered 2020-03-08 – 2020-03-12 (×8): 0.5 mg via RESPIRATORY_TRACT
  Filled 2020-03-08 (×8): qty 2

## 2020-03-08 MED ORDER — METHYLPREDNISOLONE SODIUM SUCC 40 MG IJ SOLR
40.0000 mg | Freq: Three times a day (TID) | INTRAMUSCULAR | Status: DC
  Administered 2020-03-08 – 2020-03-10 (×5): 40 mg via INTRAVENOUS
  Filled 2020-03-08 (×5): qty 1

## 2020-03-08 MED ORDER — SODIUM CHLORIDE 3 % IN NEBU
4.0000 mL | INHALATION_SOLUTION | Freq: Two times a day (BID) | RESPIRATORY_TRACT | Status: DC
  Administered 2020-03-08 – 2020-03-11 (×6): 4 mL via RESPIRATORY_TRACT
  Filled 2020-03-08 (×6): qty 4

## 2020-03-08 MED ORDER — GUAIFENESIN ER 600 MG PO TB12
1200.0000 mg | ORAL_TABLET | Freq: Two times a day (BID) | ORAL | Status: DC
  Administered 2020-03-08 – 2020-03-12 (×9): 1200 mg via ORAL
  Filled 2020-03-08 (×9): qty 2

## 2020-03-08 NOTE — Progress Notes (Signed)
Pt drinking coffee and requesting pain meds before nebulizer treatment

## 2020-03-08 NOTE — Care Management Important Message (Signed)
Important Message  Patient Details  Name: Amy Rubio MRN: [id number] Date of Birth: Jun 29, 1951   Medicare Important Message Given:  Yes     [name] 03/08/2020, 3:03 PM

## 2020-03-08 NOTE — Consult Note (Signed)
NAME:  Amy Rubio, MRN:  [id number], DOB:  [birth year], LOS: 6 ADMISSION DATE:  03/01/2020, CONSULTATION DATE:  03/08/2020 REFERRING MD:  Dr. Carles Collet, [facility], CHIEF COMPLAINT:  Shortness of breath  Brief History   69 yo female smoker presented to the ER on 03/01/20 with dyspnea and wheezing from COPD exacerbation.  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  History of present illness   She continues to smoke cigarettes.  She noticed more trouble with her breathing several days before coming to the hospital.  She also needed increasing amounts of supplemental oxygen.  Has more cough with thick sputum, and has trouble bringing up the phlegm.  Hasn't noticed hemoptysis.  Gets episodes of wheezing.  Has trouble keeping her weight up.  Past Medical History  HTN, Chronic pain, HLD, Depression, Anxiety, Polio, Osteoporosis, GERD, Esophageal stricture, Fibromyalgia, Colon polyp  Significant Hospital Events   5/31 Admit 6/06 change to DNR  Consults:    Procedures:    Significant Diagnostic Tests:    Micro Data:  SARS CoV2 5/31 >> negative Sputum 6/02 >> oral flora  Antimicrobials:  Levaquin 6/01 Zithromax 6/02 >> 6/06  Interim history/subjective:    Objective   Blood pressure (!) 154/64, pulse 60, temperature 98.5 F (36.9 C), temperature source Oral, resp. rate 20, height 5\' 5"  (1.651 m), weight 44.5 kg, SpO2 94 %.        Intake/Output Summary (Last 24 hours) at 03/08/2020 1329 Last data filed at 03/08/2020 1100 Gross per 24 hour  Intake 240 ml  Output 1000 ml  Net -760 ml   Filed Weights   03/01/20 1704 03/01/20 2300  Weight: 44 kg 44.5 kg    Examination:  General - thin Eyes - pupils reactive ENT - no sinus tenderness, no stridor Cardiac - regular rate/rhythm, no murmur Chest - decreased breath sounds, b/l expiratory wheezing and rhonchi Abdomen - soft, non tender, + bowel  sounds Extremities - no cyanosis, clubbing, or edema Skin - no rashes Neuro - normal strength, moves extremities, follows commands Psych - anxious  CXR shows hyperinflation (reviewed by me)  Resolved Hospital Problem list     Assessment & Plan:   Acute on chronic hypoxic/hypercapnic respiratory failure from COPD exacerbation with 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 - encouraged her to keep up her caloric and protein intake  Tobacco abuse. - she has decided to quit smoking  Anxiety with depression. - continue prozac and lorazepam to help prevent anxiety attacks which could contribute to dyspnea and dynamic hyperinflation  Best practice:  Diet: regular DVT prophylaxis: lovenox GI prophylaxis: not indicated Mobility: as tolerated Code Status: DNR  Labs   CBC: Recent Labs  Lab 03/01/20 1758 03/02/20 0446 03/04/20 0505  WBC 4.0 1.7* 5.5  NEUTROABS 2.6  --  4.9  HGB 12.0 12.2 10.2*  HCT 37.9 38.1 32.8*  MCV 103.3* 101.9* 103.5*  PLT 266 292 740    Basic Metabolic Panel: Recent Labs  Lab 03/01/20 1758 03/02/20 0446 03/04/20 0505 03/06/20 0659 03/08/20 0528  NA 133* 135 135 136  --  K 3.6 4.0 3.5 4.1  --   CL 92* 93* 91* 92*  --   CO2 30 31 34* 35*  --   GLUCOSE 101* 157* 149* 132*  --   BUN 15 20 23  24*  --   CREATININE 0.49 0.59 0.60 0.75 0.59  CALCIUM 9.0 9.3 9.1 8.7*  --   MG  --  1.8 2.2 2.1  --   PHOS  --   --   --  3.4  --    GFR: Estimated Creatinine Clearance: 46.6 mL/min (by C-G formula based on SCr of 0.59 mg/dL). Recent Labs  Lab 03/01/20 1758 03/02/20 0446 03/04/20 0505  WBC 4.0 1.7* 5.5    Liver Function Tests: Recent Labs  Lab  03/02/20 0446  AST 13*  ALT 10  ALKPHOS 54  BILITOT 0.5  PROT 7.1  ALBUMIN 3.9   No results for input(s): LIPASE, AMYLASE in the last 168 hours. No results for input(s): AMMONIA in the last 168 hours.  ABG    Component Value Date/Time   PHART 7.412 08/25/2019 0045   PCO2ART 48.7 (H) 08/25/2019 0045   PO2ART 73.7 (L) 08/25/2019 0045   HCO3 29.2 (H) 08/25/2019 0045   O2SAT 95.3 08/25/2019 0045     Coagulation Profile: No results for input(s): INR, PROTIME in the last 168 hours.  Cardiac Enzymes: No results for input(s): CKTOTAL, CKMB, CKMBINDEX, TROPONINI in the last 168 hours.  HbA1C: Hgb A1c MFr Bld  Date/Time Value Ref Range Status  09/30/2019 04:43 AM 5.3 4.8 - 5.6 % Final    Comment:    (NOTE) Pre diabetes:          5.7%-6.4% Diabetes:              >6.4% Glycemic control for   <7.0% adults with diabetes     CBG: No results for input(s): GLUCAP in the last 168 hours.  Review of Systems:   Reviewed and negative  Past Medical History  She,  has a past medical history of Adenomatous colon polyp (2007), Anxiety, Arthritis, Chronic pain, COPD (chronic obstructive pulmonary disease) ([place]), Depression, Dyspnea, Esophageal stricture (07/2009), Fibromyalgia, GERD (gastroesophageal reflux disease), Headache(784.0), Heart murmur, Hypertension, Osteoporosis, Polio, and Post poliomyelitis syndrome.   Surgical History    Past Surgical History:  Procedure Laterality Date  . ABDOMINAL HYSTERECTOMY     PARTIAL  . CATARACT EXTRACTION W/PHACO Left 01/26/2015   Procedure: CATARACT EXTRACTION PHACO AND INTRAOCULAR LENS PLACEMENT (IOC);  Surgeon: Rutherford Guys, MD;  Location: [facility];  Service: Ophthalmology;  Laterality: Left;  CDE:7.72  . CATARACT EXTRACTION W/PHACO Right 02/23/2015   Procedure: CATARACT EXTRACTION PHACO AND INTRAOCULAR LENS PLACEMENT (IOC);  Surgeon: Rutherford Guys, MD;  Location: [facility];  Service: Ophthalmology;  Laterality: Right;  CDE:10.68  . COLONOSCOPY   2007   adenoma (1.3cm). multiple small polyps ablated, diverticulosis  . COLONOSCOPY WITH PROPOFOL N/A 03/25/2018   Procedure: COLONOSCOPY WITH PROPOFOL;  Surgeon: Daneil Dolin, MD;  Location: [facility];  Service: Endoscopy;  Laterality: N/A;  9:15am  . ESOPHAGOGASTRODUODENOSCOPY  07/23/09   distal thickened GEJ, peptic stricture narrowed lumen to 61mm, small hh, moderate gastritis (no H.Pylori), mild duodenitis.   Marland Kitchen ESOPHAGOGASTRODUODENOSCOPY  2013   Dr. Gala Romney: Possible occult cervical esophageal web status post dilation, small hiatal hernia.  Marland Kitchen HIP PINNING,CANNULATED Left 07/15/2017   Procedure: CANNULATED HIP PINNING;  Surgeon: Carole Civil, MD;  Location: [facility];  Service: Orthopedics;  Laterality: Left;  Marland Kitchen MICROLARYNGOSCOPY N/A  07/08/2019   Procedure: MICRO DIRECT LARYNGOSOCPY WITH EXCISION VOCAL CORD MASS;  Surgeon: Leta Baptist, MD;  Location: [facility];  Service: ENT;  Laterality: N/A;  . PARTIAL HYSTERECTOMY    . POLYPECTOMY  03/25/2018   Procedure: POLYPECTOMY;  Surgeon: Daneil Dolin, MD;  Location: [facility];  Service: Endoscopy;;  sigmoid, ascending, cecal     Social History   reports that she has been smoking cigarettes. She has a 22.50 pack-year smoking history. She has never used smokeless tobacco. She reports that she does not drink alcohol or use drugs.   Family History   Her family history includes Colon cancer in her father; Hypertension in her father; Lung cancer in her sister.   Allergies No Known Allergies   Home Medications  Prior to Admission medications   Medication Sig Start Date End Date Taking? Authorizing Provider  albuterol (PROVENTIL) (2.5 MG/3ML) 0.083% nebulizer solution Take 3 mLs (2.5 mg total) by nebulization every 4 (four) hours as needed for wheezing or shortness of breath. Patient taking differently: Take 2.5 mg by nebulization 3 (three) times daily.  12/19/18  Yes [name], MD  albuterol (VENTOLIN HFA) 108 (90 Base) MCG/ACT  inhaler INHALE 2 PUFFS INTO THE LUNGS EVERY 4 HOURS AS NEEDED FOR WHEEZING ORSHORTNESS OF BREATH. Patient taking differently: Inhale 2 puffs into the lungs every 4 (four) hours as needed for wheezing or shortness of breath. INHALE 2 PUFFS INTO THE LUNGS EVERY 4 HOURS AS NEEDED FOR WHEEZING ORSHORTNESS OF BREATH. 02/14/17  Yes Luking, Elayne Snare, MD  FLUoxetine (PROZAC) 40 MG capsule Take 40 mg by mouth daily.   Yes [provider]  guaiFENesin (MUCINEX) 600 MG 12 hr tablet Take 1 tablet (600 mg total) by mouth 2 (two) times daily. Patient taking differently: Take 600 mg by mouth daily.  12/19/18  Yes [name], MD  ipratropium (ATROVENT) 0.02 % nebulizer solution Take 3 mLs by nebulization 4 (four) times daily. 01/16/20  Yes [provider]  lisinopril-hydrochlorothiazide (PRINZIDE,ZESTORETIC) 10-12.5 MG tablet Take 1 tablet by mouth daily. 01/02/18  Yes [provider]  omeprazole (PRILOSEC) 20 MG capsule Take 1 capsule (20 mg total) by mouth daily. Patient taking differently: Take 20 mg by mouth daily as needed.  08/18/16 08/18/20 Yes Luking, Elayne Snare, MD  oxyCODONE-acetaminophen (PERCOCET) 7.5-325 MG tablet Take 1 tablet by mouth 4 (four) times daily as needed. 09/20/19  Yes [provider]  pregabalin (LYRICA) 200 MG capsule Take 200 mg by mouth 2 (two) times daily. 09/20/19  Yes [provider]  QUEtiapine (SEROQUEL) 50 MG tablet Take 50 mg by mouth daily. 02/24/20  Yes [provider]  traZODone (DESYREL) 50 MG tablet Take 1 tablet (50 mg total) by mouth at bedtime. Patient taking differently: Take 50 mg by mouth at bedtime as needed.  02/14/17  Yes Luking, Scott A, MD  TRELEGY ELLIPTA 100-62.5-25 MCG/INH AEPB Take 1 puff by mouth daily.  01/29/18  Yes [provider]  feeding supplement, ENSURE ENLIVE, (ENSURE ENLIVE) LIQD Take 237 mLs by mouth 2 (two) times daily between meals. Patient not taking: Reported on 03/02/2020 03/23/19   Lavina Hamman, MD  Multiple Vitamin (MULTIVITAMIN WITH MINERALS) TABS tablet Take 1 tablet by mouth daily. Patient not taking: Reported on 03/02/2020 10/07/19   [name], MD     Signature:  Chesley Mires, MD [place] Pager - [phone] 03/08/2020, 2:27 PM

## 2020-03-08 NOTE — Progress Notes (Addendum)
PROGRESS NOTE  Amy Rubio LFY:[id number] DOB: April 23, 1951 DOA: 03/01/2020 PCP: Redmond School, MD   Brief History: 69 year old female with a history of hypertension, chronic respiratory failure on 3 L, COPD, chronic pain, hyperlipidemia, depression, anxiety, tobacco abuse presenting with 3-day history of shortness of breath and increasing wheeze. She is complaining of increasing generalized weakness. Unfortunately, the patient is not been using her nebulizer machine as directed. She turned her oxygen up to 5 L without improvement. As result she presented for further evaluation. She has received both of her COVID-19 vaccinations. She denies any fevers, chills, chest pain, nausea, vomiting, diarrhea, abd pain.  Assessment/Plan: Acute on chronic respiratory failure with hypoxia -Secondary to COPD exacerbation -Presently stable on3L nasal cannula -The patient has very little pulmonary reserve--minimal exertion causes significant shortness of breath -goal is to keep oxygenation 88-92%  COPD exacerbation -continue pulmicort -continueBrovana -Continueduo nebs -Continue IV Solu-Medrol-->wean -The patient has a degree of vocal cord dysfunction causing her wheezing -Flutter valve -Continue Robitussin/hycodan -pulmonary consult appreciated-->add hypertonic saline  Severe protein calorie malnutrition -Continue supplements  Chronic pain syndrome -Continue home dose of Percocet  Essential hypertension -d/c lisinopril -add amlodipine  Depression/anxiety -Continue home dose of fluoxetine and alprazolam   GOC -Advance care planning, including the explanation and discussion of advance directives was carried out with the patient and family. Code status including explanations of "Full Code" and "DNR" and alternatives were discussed in detail. Discussion of end-of-life issues including but not limited palliative care, hospice care and the concept of hospice, other  end-of-life care options, power of attorney for health care decisions, living wills, and physician orders for life-sustaining treatment were also discussed with the patient and family. Total face to face time 16 minutes. -DNR confirmed with pt   Status is: Inpatient  Remains inpatient appropriate because:IV treatments appropriate due to intensity of illness or inability to take PO. Dyspneic with minimal exertion requiring IV steroids and duonebs   Dispo: The patient is from:Home Anticipated d/c is EN:IDPO Anticipated d/c date is: 2 days Patient currentlyis not medically stable to d/c.         Family Communication:noFamily at bedside  Consultants:none  Code Status: DNR  DVT Prophylaxis: [place] Lovenox   Procedures: As Listed in Progress Note Above  Antibiotics: levoflox6/1>>6/2 azithro 6/2>>6/6   Subjective: Pt states breathing is slowly improving, but remains significantly sob with minimal exertion to performed grooming.  Denies f/c, cp, n/v/d, abd pain  Objective: Vitals:   03/08/20 0913 03/08/20 1325 03/08/20 1508 03/08/20 1546  BP:   (!) 165/62 (!) 145/68  Pulse:   79   Resp:   18   Temp:   98.3 F (36.8 C)   TempSrc:   Oral   SpO2: 97% 94% 95%   Weight:      Height:        Intake/Output Summary (Last 24 hours) at 03/08/2020 1638 Last data filed at 03/08/2020 1500 Gross per 24 hour  Intake 480 ml  Output 1000 ml  Net -520 ml   Weight change:  Exam:   General:  Pt is alert, follows commands appropriately, not in acute distress  HEENT: No icterus, No thrush, No neck mass, [place]/AT  Cardiovascular: RRR, S1/S2, no rubs, no gallops  Respiratory: diminished bs.  Scattered rhonchi.  Bilateral wheeze  Abdomen: Soft/+BS, non tender, non distended, no guarding  Extremities: No edema, No lymphangitis, No petechiae, No rashes, no synovitis   Data Reviewed:  I have personally reviewed  following labs and imaging studies Basic Metabolic Panel: Recent Labs  Lab 03/01/20 1758 03/02/20 0446 03/04/20 0505 03/06/20 0659 03/08/20 0528  NA 133* 135 135 136  --   K 3.6 4.0 3.5 4.1  --   CL 92* 93* 91* 92*  --   CO2 30 31 34* 35*  --   GLUCOSE 101* 157* 149* 132*  --   BUN 15 20 23  24*  --   CREATININE 0.49 0.59 0.60 0.75 0.59  CALCIUM 9.0 9.3 9.1 8.7*  --   MG  --  1.8 2.2 2.1  --   PHOS  --   --   --  3.4  --    Liver Function Tests: Recent Labs  Lab 03/02/20 0446  AST 13*  ALT 10  ALKPHOS 54  BILITOT 0.5  PROT 7.1  ALBUMIN 3.9   No results for input(s): LIPASE, AMYLASE in the last 168 hours. No results for input(s): AMMONIA in the last 168 hours. Coagulation Profile: No results for input(s): INR, PROTIME in the last 168 hours. CBC: Recent Labs  Lab 03/01/20 1758 03/02/20 0446 03/04/20 0505  WBC 4.0 1.7* 5.5  NEUTROABS 2.6  --  4.9  HGB 12.0 12.2 10.2*  HCT 37.9 38.1 32.8*  MCV 103.3* 101.9* 103.5*  PLT 266 292 298   Cardiac Enzymes: No results for input(s): CKTOTAL, CKMB, CKMBINDEX, TROPONINI in the last 168 hours. BNP: Invalid input(s): POCBNP CBG: No results for input(s): GLUCAP in the last 168 hours. HbA1C: No results for input(s): HGBA1C in the last 72 hours. Urine analysis:    Component Value Date/Time   COLORURINE STRAW (A) 08/25/2019 0103   APPEARANCEUR CLEAR 08/25/2019 0103   LABSPEC 1.003 (L) 08/25/2019 0103   PHURINE 7.0 08/25/2019 0103   GLUCOSEU NEGATIVE 08/25/2019 0103   HGBUR NEGATIVE 08/25/2019 0103   BILIRUBINUR NEGATIVE 08/25/2019 0103   KETONESUR NEGATIVE 08/25/2019 0103   PROTEINUR NEGATIVE 08/25/2019 0103   NITRITE NEGATIVE 08/25/2019 0103   LEUKOCYTESUR NEGATIVE 08/25/2019 0103   Sepsis Labs: @LABRCNTIP (procalcitonin:4,lacticidven:4) ) Recent Results (from the past 240 hour(s))  SARS Coronavirus 2 by RT PCR (hospital order, performed in [facility] hospital lab) Nasopharyngeal Nasopharyngeal Swab     Status:  None   Collection Time: 03/01/20  6:20 PM   Specimen: Nasopharyngeal Swab  Result Value Ref Range Status   SARS Coronavirus 2 NEGATIVE NEGATIVE Final    Comment: (NOTE) SARS-CoV-2 target nucleic acids are NOT DETECTED. The SARS-CoV-2 RNA is generally detectable in upper and lower respiratory specimens during the acute phase of infection. The lowest concentration of SARS-CoV-2 viral copies this assay can detect is 250 copies / mL. A negative result does not preclude SARS-CoV-2 infection and should not be used as the sole basis for treatment or other patient management decisions.  A negative result may occur with improper specimen collection / handling, submission of specimen other than nasopharyngeal swab, presence of viral mutation(s) within the areas targeted by this assay, and inadequate number of viral copies (<250 copies / mL). A negative result must be combined with clinical observations, patient history, and epidemiological information. Fact Sheet for Patients:   StrictlyIdeas.no Fact Sheet for Healthcare Providers: BankingDealers.co.za This test is not yet approved or cleared  by the [place] FDA and has been authorized for detection and/or diagnosis of SARS-CoV-2 by FDA under an Emergency Use Authorization (EUA).  This EUA will remain in effect (meaning this test can be used) for the duration  of the COVID-19 declaration under Section 564(b)(1) of the Act, 21 U.S.C. section 360bbb-3(b)(1), unless the authorization is terminated or revoked sooner. Performed at [facility], [address], [place], [place] [address]   Expectorated sputum assessment w rflx to resp cult     Status: None   Collection Time: 03/03/20  9:00 AM   Specimen: Expectorated Sputum  Result Value Ref Range Status   Specimen Description EXPECTORATED SPUTUM  Final   Special Requests NONE  Final   Sputum evaluation   Final    THIS SPECIMEN IS ACCEPTABLE FOR  SPUTUM CULTURE Performed at [facility], [address], [place], [place] [address]    Report Status 03/03/2020 FINAL  Final  Culture, respiratory     Status: None   Collection Time: 03/03/20  9:00 AM  Result Value Ref Range Status   Specimen Description   Final    EXPECTORATED SPUTUM Performed at [facility], [address], [place], [place] [address]    Special Requests   Final    NONE Reflexed from [id number] Performed at [facility], [address] [place], [place] [address]    Gram Stain   Final    FEW WBC PRESENT, PREDOMINANTLY PMN MODERATE GRAM POSITIVE COCCI IN PAIRS IN CHAINS FEW GRAM POSITIVE RODS    Culture   Final    ABUNDANT Consistent with normal respiratory flora. Performed at [facility], [place] [address], [place], [place] [address]    Report Status 03/05/2020 FINAL  Final     Scheduled Meds: . amLODipine  5 mg Oral Daily  . budesonide (PULMICORT) nebulizer solution  0.5 mg Nebulization BID  . enoxaparin (LOVENOX) injection  30 mg Subcutaneous QHS  . feeding supplement (ENSURE ENLIVE)  237 mL Oral BID BM  . FLUoxetine  40 mg Oral Daily  . guaiFENesin  1,200 mg Oral BID  . hydrochlorothiazide  12.5 mg Oral Daily  . ipratropium-albuterol  3 mL Nebulization Q6H  . methylPREDNISolone (SOLU-MEDROL) injection  40 mg Intravenous Q8H  . multivitamin with minerals  1 tablet Oral Daily  . pregabalin  200 mg Oral BID  . sodium chloride HYPERTONIC  4 mL Nebulization BID   Continuous Infusions:  Procedures/Studies: DG Chest Portable 1 View  Result Date: 03/01/2020 CLINICAL DATA:  Shortness of breath and wheezing for 3 days. EXAM: PORTABLE CHEST 1 VIEW COMPARISON:  Radiograph 09/29/2019, chest CT 04/28/2016 FINDINGS: Chronic hyperinflation and bronchial thickening. Normal heart size and mediastinal contours. Aortic atherosclerosis. Left basilar Bochdalek hernia. Presumed nipple shadows projecting over the lung bases. No focal airspace disease, pulmonary edema,  pleural effusion or pneumothorax. No acute osseous abnormalities are seen. IMPRESSION: 1. Chronic hyperinflation and bronchial thickening, consistent with COPD. No acute abnormality. 2. Presumed nipple shadows projecting over the lung bases, also seen on prior exam. Aortic Atherosclerosis (ICD10-I70.0). Electronically Signed   By: Keith Rake M.D.   On: 03/01/2020 17:36    Orson Eva, DO  [facility]  If 7PM-7AM, please contact night-coverage [email or web address] Password [email or web address] 03/08/2020, 4:38 PM   LOS: 6 days

## 2020-03-09 ENCOUNTER — Inpatient Hospital Stay (HOSPITAL_COMMUNITY): Payer: Medicare HMO

## 2020-03-09 MED ORDER — OXYCODONE-ACETAMINOPHEN 7.5-325 MG PO TABS
1.0000 | ORAL_TABLET | ORAL | Status: DC | PRN
  Administered 2020-03-09 – 2020-03-12 (×16): 1 via ORAL
  Filled 2020-03-09 (×16): qty 1

## 2020-03-09 MED ORDER — ALPRAZOLAM 0.5 MG PO TABS
0.5000 mg | ORAL_TABLET | Freq: Four times a day (QID) | ORAL | Status: DC | PRN
  Administered 2020-03-09 – 2020-03-12 (×12): 0.5 mg via ORAL
  Filled 2020-03-09 (×12): qty 1

## 2020-03-09 NOTE — Progress Notes (Signed)
Nutrition Follow-up  DOCUMENTATION CODES:   Severe malnutrition in context of chronic illness  INTERVENTION:  Continue Ensure Enlive BID Encouraged po intake of meals and supplements   NUTRITION DIAGNOSIS:   Severe Malnutrition related to catabolic illness(Acute exacerbation of Chronic COPD) as evidenced by severe fat depletion, severe muscle depletion. Ongoing   GOAL:   Patient will meet greater than or equal to 90% of their needs Meeting with po intake of meals and supplements    MONITOR:   PO intake, Supplement acceptance, Labs, Skin, Weight trends  REASON FOR ASSESSMENT:   Malnutrition Screening Tool    ASSESSMENT:   Patient is a 69 yo female with hx of Malnutrition, COPD, GERD, HTN, Esophageal stricture and chronic pain. She presents with shortness of breath and coughing. Home oxygen 2-3 liters at baseline per pt.  Patient with good po intake of meals, per flowsheets, 75-100% x 8 documented meals from 6/4-6/7. Patient awake alert sitting up in bed this afternoon. She endorses good appetite and intake of meals and is drinking Ensure supplements. Patient reports that she was drinking 1 Ensure daily at home prior to admission. RD encouraged continued po intake of meals and supplements during admission. Recommended increasing daily nutrition supplement at home to 2x daily. Educated on small frequent meals throughout the day verses larger meals, discussed ways to increase calories and protein with meals as well as suggestions on high protein high calorie snacks.  No new weights this admission, recommend obtaining current weight to assess trends.  Medications reviewed and include: Prozac, Mucinex, Methylprednisolone, Lyrica Labs: no new labs for review    Diet Order:   Diet Order            Diet regular Room service appropriate? Yes; Fluid consistency: Thin  Diet effective now              EDUCATION NEEDS:   Education needs have been addressed  Skin:  Skin  Assessment: Reviewed RN Assessment  Last BM:  6/3  Height:   Ht Readings from Last 1 Encounters:  03/01/20 5\' 5"  (1.651 m)    Weight:   Wt Readings from Last 1 Encounters:  03/01/20 44.5 kg    BMI:  Body mass index is 16.34 kg/m.  Estimated Nutritional Needs:   Kcal:  1740-8144  Protein:  67-75 gr  Fluid:  >1300 ml daily   Lajuan Lines, RD, LDN Clinical Nutrition After Hours/Weekend Pager # in [employer]

## 2020-03-09 NOTE — Progress Notes (Addendum)
PROGRESS NOTE  Amy Rubio GNF:[id number] DOB: 1951-02-23 DOA: 03/01/2020 PCP: Redmond School, MD    Brief History: 69 year old female with a history of hypertension, chronic respiratory failure on 3 L, COPD, chronic pain, hyperlipidemia, depression, anxiety, tobacco abuse presenting with 3-day history of shortness of breath and increasing wheeze. She is complaining of increasing generalized weakness. Unfortunately, the patient is not been using her nebulizer machine as directed. She turned her oxygen up to 5 L without improvement. As result she presented for further evaluation. She has received both of her COVID-19 vaccinations. She denies any fevers, chills, chest pain, nausea, vomiting, diarrhea, abd pain.  Assessment/Plan: Acute on chronic respiratory failure with hypoxia -Secondary to COPD exacerbation -Presently stable on3L nasal cannula -The patient has very little pulmonary reserve--minimal exertion causes significant shortness of breath -goal is to keep oxygenation 88-92% -6/8--personally reviewed CXR--interstitial thickening without consolidation  COPD exacerbation -continue pulmicort -Continueduo nebs -Continue IV Solu-Medrol-->wean -The patient has a degree of vocal cord dysfunction causing her wheezing -Flutter valve -Continue Robitussin/hycodan -pulmonary consult appreciated-->add hypertonic saline  Severe protein calorie malnutrition -Continue supplements  Chronic pain syndrome -Continue home dose of Percocet  Essential hypertension -d/c lisinopril -add amlodipine  Depression/anxiety -Continue home dose of fluoxetine  -increase alprazolam to q 6 hour prn   GOC -Advance care planning, including the explanation and discussion of advance directives was carried out with the patient and family. Code status including explanations of "Full Code" and "DNR" and alternatives were discussed in detail. Discussion of end-of-life issues  including but not limited palliative care, hospice care and the concept of hospice, other end-of-life care options, power of attorney for health care decisions, living wills, and physician orders for life-sustaining treatment were also discussed with the patient and family. Total face to face time 16 minutes. -6/8--pt wants to change back to full code   Status is: Inpatient  Remains inpatient appropriate because:IV treatments appropriate due to intensity of illness or inability to take PO. Dyspneic with minimal exertion requiring IV steroids and duonebs   Dispo: The patient is from:Home Anticipated d/c is QI:ONGE Anticipated d/c date is: 2 days Patient currentlyis not medically stable to d/c.         Family Communication:noFamily at bedside  Consultants:none  Code Status: DNR  DVT Prophylaxis: [place] Lovenox   Procedures: As Listed in Progress Note Above  Antibiotics: levoflox6/1>>6/2 azithro 6/2>>6/6   Subjective: Pt had panic attack today.  Denies f/c, cp, n/v/d.  Has sob walking to sink.  Objective: Vitals:   03/09/20 0759 03/09/20 1240 03/09/20 1247 03/09/20 1342  BP:  118/84  (!) 143/64  Pulse:  93  87  Resp:  20  17  Temp:  98.1 F (36.7 C)  98.1 F (36.7 C)  TempSrc:  Oral  Oral  SpO2: 98% 97% 97% 99%  Weight:      Height:        Intake/Output Summary (Last 24 hours) at 03/09/2020 1850 Last data filed at 03/09/2020 0600 Gross per 24 hour  Intake --  Output 1200 ml  Net -1200 ml   Weight change:  Exam:   General:  Pt is alert, follows commands appropriately, not in acute distress  HEENT: No icterus, No thrush, No neck mass, [place]/AT  Cardiovascular: RRR, S1/S2, no rubs, no gallops  Respiratory: bilateral exp wheeze. Bilateral rhonchi  Abdomen: Soft/+BS, non tender, non distended, no guarding  Extremities: No edema, No lymphangitis, No petechiae, No  rashes, no  synovitis   Data Reviewed: I have personally reviewed following labs and imaging studies Basic Metabolic Panel: Recent Labs  Lab 03/04/20 0505 03/06/20 0659 03/08/20 0528  NA 135 136  --   K 3.5 4.1  --   CL 91* 92*  --   CO2 34* 35*  --   GLUCOSE 149* 132*  --   BUN 23 24*  --   CREATININE 0.60 0.75 0.59  CALCIUM 9.1 8.7*  --   MG 2.2 2.1  --   PHOS  --  3.4  --    Liver Function Tests: No results for input(s): AST, ALT, ALKPHOS, BILITOT, PROT, ALBUMIN in the last 168 hours. No results for input(s): LIPASE, AMYLASE in the last 168 hours. No results for input(s): AMMONIA in the last 168 hours. Coagulation Profile: No results for input(s): INR, PROTIME in the last 168 hours. CBC: Recent Labs  Lab 03/04/20 0505  WBC 5.5  NEUTROABS 4.9  HGB 10.2*  HCT 32.8*  MCV 103.5*  PLT 298   Cardiac Enzymes: No results for input(s): CKTOTAL, CKMB, CKMBINDEX, TROPONINI in the last 168 hours. BNP: Invalid input(s): POCBNP CBG: No results for input(s): GLUCAP in the last 168 hours. HbA1C: No results for input(s): HGBA1C in the last 72 hours. Urine analysis:    Component Value Date/Time   COLORURINE STRAW (A) 08/25/2019 0103   APPEARANCEUR CLEAR 08/25/2019 0103   LABSPEC 1.003 (L) 08/25/2019 0103   PHURINE 7.0 08/25/2019 0103   GLUCOSEU NEGATIVE 08/25/2019 0103   HGBUR NEGATIVE 08/25/2019 0103   BILIRUBINUR NEGATIVE 08/25/2019 0103   KETONESUR NEGATIVE 08/25/2019 0103   PROTEINUR NEGATIVE 08/25/2019 0103   NITRITE NEGATIVE 08/25/2019 0103   LEUKOCYTESUR NEGATIVE 08/25/2019 0103   Sepsis Labs: @LABRCNTIP (procalcitonin:4,lacticidven:4) ) Recent Results (from the past 240 hour(s))  SARS Coronavirus 2 by RT PCR (hospital order, performed in [place] hospital lab) Nasopharyngeal Nasopharyngeal Swab     Status: None   Collection Time: 03/01/20  6:20 PM   Specimen: Nasopharyngeal Swab  Result Value Ref Range Status   SARS Coronavirus 2 NEGATIVE NEGATIVE Final     Comment: (NOTE) SARS-CoV-2 target nucleic acids are NOT DETECTED. The SARS-CoV-2 RNA is generally detectable in upper and lower respiratory specimens during the acute phase of infection. The lowest concentration of SARS-CoV-2 viral copies this assay can detect is 250 copies / mL. A negative result does not preclude SARS-CoV-2 infection and should not be used as the sole basis for treatment or other patient management decisions.  A negative result may occur with improper specimen collection / handling, submission of specimen other than nasopharyngeal swab, presence of viral mutation(s) within the areas targeted by this assay, and inadequate number of viral copies (<250 copies / mL). A negative result must be combined with clinical observations, patient history, and epidemiological information. Fact Sheet for Patients:   StrictlyIdeas.no Fact Sheet for Healthcare Providers: BankingDealers.co.za This test is not yet approved or cleared  by the [place] FDA and has been authorized for detection and/or diagnosis of SARS-CoV-2 by FDA under an Emergency Use Authorization (EUA).  This EUA will remain in effect (meaning this test can be used) for the duration of the COVID-19 declaration under Section 564(b)(1) of the Act, 21 U.S.C. section 360bbb-3(b)(1), unless the authorization is terminated or revoked sooner. Performed at [facility], [address], [place], [place] [address]   Expectorated sputum assessment w rflx to resp cult     Status: None   Collection  Time: 03/03/20  9:00 AM   Specimen: Expectorated Sputum  Result Value Ref Range Status   Specimen Description EXPECTORATED SPUTUM  Final   Special Requests NONE  Final   Sputum evaluation   Final    THIS SPECIMEN IS ACCEPTABLE FOR SPUTUM CULTURE Performed at [facility], [address] [place], [place] [address]    Report Status 03/03/2020 FINAL  Final  Culture, respiratory      Status: None   Collection Time: 03/03/20  9:00 AM  Result Value Ref Range Status   Specimen Description   Final    EXPECTORATED SPUTUM Performed at [facility], [address] [place], [place] [address]    Special Requests   Final    NONE Reflexed from [id number] Performed at [facility], [address], [place], [place] [address]    Gram Stain   Final    FEW WBC PRESENT, PREDOMINANTLY PMN MODERATE GRAM POSITIVE COCCI IN PAIRS IN CHAINS FEW GRAM POSITIVE RODS    Culture   Final    ABUNDANT Consistent with normal respiratory flora. Performed at [facility], [place] [address], [place], [place] [address]    Report Status 03/05/2020 FINAL  Final     Scheduled Meds: . amLODipine  5 mg Oral Daily  . budesonide (PULMICORT) nebulizer solution  0.5 mg Nebulization BID  . enoxaparin (LOVENOX) injection  30 mg Subcutaneous QHS  . feeding supplement (ENSURE ENLIVE)  237 mL Oral BID BM  . FLUoxetine  40 mg Oral Daily  . guaiFENesin  1,200 mg Oral BID  . hydrochlorothiazide  12.5 mg Oral Daily  . ipratropium-albuterol  3 mL Nebulization Q6H  . methylPREDNISolone (SOLU-MEDROL) injection  40 mg Intravenous Q8H  . multivitamin with minerals  1 tablet Oral Daily  . pregabalin  200 mg Oral BID  . sodium chloride HYPERTONIC  4 mL Nebulization BID   Continuous Infusions:  Procedures/Studies: DG CHEST PORT 1 VIEW  Result Date: 03/09/2020 CLINICAL DATA:  Respiratory distress EXAM: PORTABLE CHEST 1 VIEW COMPARISON:  Mar 01, 2020 FINDINGS: Lungs remain hyperexpanded. Apparent nipple shadow right base. No edema or airspace opacity. There is mild scarring and interstitial thickening, stable. No pleural effusions are evident. Heart size and pulmonary vascularity normal. There is aortic atherosclerosis. 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 No bone lesions. IMPRESSION: Lungs hyperexpanded with areas of mild scarring and interstitial thickening, likely due to underlying  chronic bronchitis. No frank edema or consolidation. Presumed nipple shadow on the right, stable. Repeat study with nipple markers potentially could be helpful to confirm. 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 Stable cardiac silhouette. No adenopathy. Aortic Atherosclerosis (ICD10-I70.0). Electronically Signed   By: Lowella Grip III M.D.   On: 03/09/2020 13:27   DG Chest Portable 1 View  Result Date: 03/01/2020 CLINICAL DATA:  Shortness of breath and wheezing for 3 days. EXAM: PORTABLE CHEST 1 VIEW COMPARISON:  Radiograph 09/29/2019, chest CT 04/28/2016 FINDINGS: Chronic hyperinflation and bronchial thickening. Normal heart size and mediastinal contours. Aortic atherosclerosis. Left basilar Bochdalek hernia. Presumed nipple shadows projecting over the lung bases. No focal airspace disease, pulmonary edema, pleural effusion or pneumothorax. No acute osseous abnormalities are seen. IMPRESSION: 1. Chronic hyperinflation and bronchial thickening, consistent with COPD. No acute abnormality. 2. Presumed nipple shadows projecting over the lung bases, also seen on prior exam. Aortic Atherosclerosis (ICD10-I70.0). Electronically Signed   By: Keith Rake M.D.   On: 03/01/2020 17:36  Orson Eva, DO  [facility]  If 7PM-7AM, please contact night-coverage [email or web address] Password [email or web address] 03/09/2020, 6:50 PM   LOS: 7 days

## 2020-03-09 NOTE — Progress Notes (Signed)
NAME:  Amy Rubio, MRN:  [id number], DOB:  1950-10-08, LOS: 7 ADMISSION DATE:  03/01/2020, CONSULTATION DATE:  03/08/2020 REFERRING MD:  Dr. Carles Collet, [facility], CHIEF COMPLAINT:  Shortness of breath  Brief History   69 yo female smoker presented to the ER on 03/01/20 with dyspnea and wheezing from COPD exacerbation.  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  Past Medical History  HTN, Chronic pain, HLD, Depression, Anxiety, Polio, Osteoporosis, GERD, Esophageal stricture, Fibromyalgia, Colon polyp  Significant Hospital Events   5/31 Admit 6/06 change to DNR 6/08 change back to full code  Consults:    Procedures:    Significant Diagnostic Tests:    Micro Data:  SARS CoV2 5/31 >> negative Sputum 6/02 >> oral flora  Antimicrobials:  Levaquin 6/01 Zithromax 6/02 >> 6/06  Interim history/subjective:  Breathing better.  Able to cough up sputum more easily.  Still having wheeze.  Decided to change to full code again.  Feels that xanax helps slow her breathing down.  Having more pain related to post-polio and wants to see if increasing frequency of percocet could help.  Objective   Blood pressure (!) 143/64, pulse 87, temperature 98.1 F (36.7 C), temperature source Oral, resp. rate 17, height 5\' 5"  (1.651 m), weight 44.5 kg, SpO2 99 %.        Intake/Output Summary (Last 24 hours) at 03/09/2020 1445 Last data filed at 03/09/2020 0600 Gross per 24 hour  Intake 480 ml  Output 2200 ml  Net -1720 ml   Filed Weights   03/01/20 1704 03/01/20 2300  Weight: 44 kg 44.5 kg    Examination:  General - alert Eyes - pupils reactive ENT - no sinus tenderness, no stridor Cardiac - regular rate/rhythm, no murmur Chest - better air movement, b/l wheeze, scattered rhonchi Abdomen - soft, non tender, + bowel sounds Extremities - no cyanosis, clubbing, or edema Skin - no rashes Neuro - normal strength, moves  extremities, follows commands Psych - normal mood and behavior   Resolved Hospital Problem list     Assessment & Plan:   Acute on chronic hypoxic/hypercapnic respiratory failure from COPD exacerbation with 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 - encouraged enteral intake  Tobacco abuse. - she has decided to quit smoking  Anxiety with depression. 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 - continue lyrica, prn percocet  Best practice:  Diet: regular DVT prophylaxis: lovenox GI prophylaxis: not indicated Mobility: as tolerated Code Status: full code  Updated pt's grand daughter at bedside.  Labs:   CMP Latest Ref Rng & Units 03/08/2020 03/06/2020 03/04/2020  Glucose 70 - 99 mg/dL - 132(H) 149(H)  BUN 8 - 23 mg/dL - 24(H) 23  Creatinine 0.44 - 1.00 mg/dL 0.59 0.75 0.60  Sodium 135 - 145 mmol/L - 136 135  Potassium 3.5 - 5.1 mmol/L - 4.1 3.5  Chloride 98 - 111 mmol/L - 92(L) 91(L)  CO2 22 - 32 mmol/L - 35(H) 34(H)  Calcium 8.9 - 10.3 mg/dL - 8.7(L) 9.1  Total Protein 6.5 - 8.1 g/dL - - -  Total Bilirubin 0.3 - 1.2 mg/dL - - -  Alkaline Phos 38 -  126 U/L - - -  AST 15 - 41 U/L - - -  ALT 0 - 44 U/L - - -    CBC Latest Ref Rng & Units 03/04/2020 03/02/2020 03/01/2020  WBC 4.0 - 10.5 K/uL 5.5 1.7(L) 4.0  Hemoglobin 12.0 - 15.0 g/dL 10.2(L) 12.2 12.0  Hematocrit 36.0 - 46.0 % 32.8(L) 38.1 37.9  Platelets 150 - 400 K/uL 298 292 266    ABG    Component Value Date/Time   PHART 7.412 08/25/2019 0045   PCO2ART 48.7 (H) 08/25/2019 0045   PO2ART 73.7 (L) 08/25/2019 0045   HCO3 29.2 (H) 08/25/2019 0045   O2SAT 95.3 08/25/2019 0045    Signature:  Chesley Mires, MD [place] Pager - [phone] 03/09/2020, 2:45 PM

## 2020-03-10 DIAGNOSIS — Z515 Encounter for palliative care: Secondary | ICD-10-CM

## 2020-03-10 DIAGNOSIS — Z7189 Other specified counseling: Secondary | ICD-10-CM

## 2020-03-10 MED ORDER — PREDNISONE 20 MG PO TABS
40.0000 mg | ORAL_TABLET | Freq: Every day | ORAL | Status: DC
  Administered 2020-03-10 – 2020-03-12 (×3): 40 mg via ORAL
  Filled 2020-03-10 (×3): qty 2

## 2020-03-10 NOTE — Progress Notes (Signed)
PROGRESS NOTE  ARASELY AKKERMAN ZES:[id number] DOB: 1951-06-13 DOA: 03/01/2020 PCP: Redmond School, MD    Brief History: 69 year old female with a history of hypertension, chronic respiratory failure on 3 L, COPD, chronic pain, hyperlipidemia, depression, anxiety, tobacco abuse presenting with 3-day history of shortness of breath and increasing wheeze. She is complaining of increasing generalized weakness. Unfortunately, the patient is not been using her nebulizer machine as directed. She turned her oxygen up to 5 L without improvement. As result she presented for further evaluation. She has received both of her COVID-19 vaccinations. She denies any fevers, chills, chest pain, nausea, vomiting, diarrhea, abd pain.  Assessment/Plan: Acute on chronic respiratory failure with hypoxia -Secondary to COPD exacerbation -Presently stable on4L nasal cannula -The patient has very little pulmonary reserve--minimal exertion causes significant shortness of breath -goal is to keep oxygenation 88-92% -6/8--personally reviewed CXR--interstitial thickening without consolidation  COPD exacerbation -continue pulmicort -Continueduo nebs -Continue steroids; transitioning to oral prednisone today. -The patient has a degree of vocal cord dysfunction causing her wheezing -continue Flutter valve -Continue Robitussin/hycodan -pulmonary consult appreciated-->continue added use of hypertonic saline  Severe protein calorie malnutrition -Continue feeding supplements -Advised to maintain adequate oral hydration and nutrition.  Chronic pain syndrome -Continue home dose of Percocet -Overall with well-controlled pain.  Essential hypertension -d/c lisinopril -Continue amlodipine -Follow vital signs and further adjust antihypertensive regimen as needed.  Depression/anxiety -Continue home dose of fluoxetine  -increase alprazolam to q 6 hour prn   GOC -Extensive discussion of goals  of care by Dr. Carles Collet and palliative care service -Currently plan is for continue aggressive management and care -Patient is a full code.   Status is: Inpatient    Dispo: The patient is from:Home Anticipated d/c is UQ:[facility] Anticipated d/c date is: 1-2 days Patient currentlyis not medically stable to d/c.  Still having difficulty speaking in full sentences and short of breath; will attempt transition to oral steroids can continue nebulizer management.  Continue hypertonic saline as indicated by pulmonologist.  Continue oxygen supplementation.     Family Communication:noFamily at bedside  Consultants:Pulmonologist and palliative care service  Code Status: Full code  DVT Prophylaxis: [place] Lovenox   Procedures: As Listed in Progress Note Above  Antibiotics: levoflox6/1>>6/2 azithro 6/2>>6/6   Subjective: No chest pain, no nausea or vomiting.  Patient has remained afebrile.  Still short of breath on exertion, easily winded and with diffuse expiratory wheezing.  1 and 4 L nasal cannula supplementation; having some difficulty speaking in full sentences.  Per chart review and further discussion with patient she expressed to feel better today.  Objective: Vitals:   03/10/20 0737 03/10/20 0742 03/10/20 0750 03/10/20 1311  BP:    (!) 115/57  Pulse:    87  Resp:    19  Temp:    98 F (36.7 C)  TempSrc:    Oral  SpO2: 100% 100% 100% 98%  Weight:      Height:        Intake/Output Summary (Last 24 hours) at 03/10/2020 1526 Last data filed at 03/10/2020 1300 Gross per 24 hour  Intake 480 ml  Output 900 ml  Net -420 ml   Weight change:   Exam: General exam: Alert, awake, oriented x 3; having some difficulty speaking in full sentences and is still feeling short of breath.  Patient using 4 L nasal cannula supplementation currently.  No chest pain, no nausea, no vomiting, no fever. Respiratory system: Diffuse  expiratory  wheezing and diffuse rhonchi are appreciated; fair air movement bilaterally.  No using accessory muscles. Cardiovascular system: RRR. No murmurs, rubs, gallops. Gastrointestinal system: Abdomen is nondistended, soft and nontender. No organomegaly or masses felt. Normal bowel sounds heard. Central nervous system: Alert and oriented. No focal neurological deficits. Extremities: No cyanosis or clubbing. Skin: No rashes, no petechiae. Psychiatry: Mood & affect appropriate.    Data Reviewed: I have personally reviewed following labs and imaging studies  Basic Metabolic Panel: Recent Labs  Lab 03/04/20 0505 03/06/20 0659 03/08/20 0528  NA 135 136  --   K 3.5 4.1  --   CL 91* 92*  --   CO2 34* 35*  --   GLUCOSE 149* 132*  --   BUN 23 24*  --   CREATININE 0.60 0.75 0.59  CALCIUM 9.1 8.7*  --   MG 2.2 2.1  --   PHOS  --  3.4  --    CBC: Recent Labs  Lab 03/04/20 0505  WBC 5.5  NEUTROABS 4.9  HGB 10.2*  HCT 32.8*  MCV 103.5*  PLT 298   Urine analysis:    Component Value Date/Time   COLORURINE STRAW (A) 08/25/2019 0103   APPEARANCEUR CLEAR 08/25/2019 0103   LABSPEC 1.003 (L) 08/25/2019 0103   PHURINE 7.0 08/25/2019 0103   GLUCOSEU NEGATIVE 08/25/2019 0103   HGBUR NEGATIVE 08/25/2019 0103   BILIRUBINUR NEGATIVE 08/25/2019 0103   KETONESUR NEGATIVE 08/25/2019 0103   PROTEINUR NEGATIVE 08/25/2019 0103   NITRITE NEGATIVE 08/25/2019 0103   LEUKOCYTESUR NEGATIVE 08/25/2019 0103   Sepsis Labs:  Recent Results (from the past 240 hour(s))  SARS Coronavirus 2 by RT PCR (hospital order, performed in [place] hospital lab) Nasopharyngeal Nasopharyngeal Swab     Status: None   Collection Time: 03/01/20  6:20 PM   Specimen: Nasopharyngeal Swab  Result Value Ref Range Status   SARS Coronavirus 2 NEGATIVE NEGATIVE Final    Comment: (NOTE) SARS-CoV-2 target nucleic acids are NOT DETECTED. The SARS-CoV-2 RNA is generally detectable in upper and lower respiratory specimens  during the acute phase of infection. The lowest concentration of SARS-CoV-2 viral copies this assay can detect is 250 copies / mL. A negative result does not preclude SARS-CoV-2 infection and should not be used as the sole basis for treatment or other patient management decisions.  A negative result may occur with improper specimen collection / handling, submission of specimen other than nasopharyngeal swab, presence of viral mutation(s) within the areas targeted by this assay, and inadequate number of viral copies (<250 copies / mL). A negative result must be combined with clinical observations, patient history, and epidemiological information. Fact Sheet for Patients:   StrictlyIdeas.no Fact Sheet for Healthcare Providers: BankingDealers.co.za This test is not yet approved or cleared  by the [place] FDA and has been authorized for detection and/or diagnosis of SARS-CoV-2 by FDA under an Emergency Use Authorization (EUA).  This EUA will remain in effect (meaning this test can be used) for the duration of the COVID-19 declaration under Section 564(b)(1) of the Act, 21 U.S.C. section 360bbb-3(b)(1), unless the authorization is terminated or revoked sooner. Performed at [facility], [address], [name], [place] [address]   Expectorated sputum assessment w rflx to resp cult     Status: None   Collection Time: 03/03/20  9:00 AM   Specimen: Expectorated Sputum  Result Value Ref Range Status   Specimen Description EXPECTORATED SPUTUM  Final   Special Requests NONE  Final   Sputum evaluation   Final    THIS SPECIMEN IS ACCEPTABLE FOR SPUTUM CULTURE Performed at [facility], [address], [place], [place] [address]    Report Status 03/03/2020 FINAL  Final  Culture, respiratory     Status: None   Collection Time: 03/03/20  9:00 AM  Result Value Ref Range Status   Specimen Description   Final    EXPECTORATED SPUTUM Performed at  [facility], [address] [place], [place] [address]    Special Requests   Final    NONE Reflexed from [id number] Performed at [facility], [address] [place], [place] [address]    Gram Stain   Final    FEW WBC PRESENT, PREDOMINANTLY PMN MODERATE GRAM POSITIVE COCCI IN PAIRS IN CHAINS FEW GRAM POSITIVE RODS    Culture   Final    ABUNDANT Consistent with normal respiratory flora. Performed at [facility], [place] [address] [place],  [address]    Report Status 03/05/2020 FINAL  Final     Scheduled Meds: . amLODipine  5 mg Oral Daily  . budesonide (PULMICORT) nebulizer solution  0.5 mg Nebulization BID  . enoxaparin (LOVENOX) injection  30 mg Subcutaneous QHS  . feeding supplement (ENSURE ENLIVE)  237 mL Oral BID BM  . FLUoxetine  40 mg Oral Daily  . guaiFENesin  1,200 mg Oral BID  . hydrochlorothiazide  12.5 mg Oral Daily  . ipratropium-albuterol  3 mL Nebulization Q6H  . multivitamin with minerals  1 tablet Oral Daily  . predniSONE  40 mg Oral Q breakfast  . pregabalin  200 mg Oral BID  . sodium chloride HYPERTONIC  4 mL Nebulization BID   Continuous Infusions:  Procedures/Studies: DG CHEST PORT 1 VIEW  Result Date: 03/09/2020 CLINICAL DATA:  Respiratory distress EXAM: PORTABLE CHEST 1 VIEW COMPARISON:  Mar 01, 2020 FINDINGS: Lungs remain hyperexpanded. Apparent nipple shadow right base. No edema or airspace opacity. There is mild scarring and interstitial thickening, stable. No pleural effusions are evident. Heart size and pulmonary vascularity normal. There is aortic atherosclerosis. 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 No bone lesions. IMPRESSION: Lungs hyperexpanded with areas of mild scarring and interstitial thickening, likely due to underlying chronic bronchitis. No frank edema or consolidation. Presumed nipple shadow on the right, stable. Repeat study with nipple markers potentially could be helpful to confirm. 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 Stable cardiac silhouette. No adenopathy. Aortic Atherosclerosis (ICD10-I70.0). Electronically Signed   By: Lowella Grip III M.D.   On: 03/09/2020 13:27   DG Chest Portable 1 View  Result Date: 03/01/2020 CLINICAL DATA:  Shortness of breath and wheezing for 3 days. EXAM: PORTABLE CHEST 1 VIEW COMPARISON:  Radiograph 09/29/2019, chest CT 04/28/2016 FINDINGS: Chronic hyperinflation and bronchial thickening. Normal heart size and mediastinal contours. Aortic atherosclerosis. Left basilar Bochdalek hernia. Presumed nipple shadows projecting over the lung bases. No focal airspace disease, pulmonary edema, pleural effusion or pneumothorax. No acute osseous abnormalities are seen. IMPRESSION: 1. Chronic hyperinflation and bronchial thickening, consistent with COPD. No acute abnormality. 2. Presumed nipple shadows projecting over the lung bases, also seen on prior exam. Aortic Atherosclerosis (ICD10-I70.0). Electronically Signed   By: Keith Rake M.D.   On: 03/01/2020 17:36    Barton Dubois, MD  [facility]   03/10/2020, 3:26 PM   LOS: 8 days
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
## 2020-03-10 NOTE — Care Management Important Message (Signed)
Important Message  Patient Details  Name: Amy Rubio MRN: [id number] Date of Birth: Nov 26, 1950   Medicare Important Message Given:  Yes     [name] 03/10/2020, 1:01 PM

## 2020-03-10 NOTE — Progress Notes (Signed)
NAME:  Amy Rubio, MRN:  [id number], DOB:  1951-09-01, LOS: 8 ADMISSION DATE:  03/01/2020, CONSULTATION DATE:  03/08/2020 REFERRING MD:  Dr. Carles Collet, [facility], CHIEF COMPLAINT:  Shortness of breath  Brief History   69 yo female smoker presented to the ER on 03/01/20 with dyspnea and wheezing from COPD exacerbation.  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  Past Medical History  HTN, Chronic pain, HLD, Depression, Anxiety, Polio, Osteoporosis, GERD, Esophageal stricture, Fibromyalgia, Colon polyp  Significant Hospital Events   5/31 Admit 6/06 change to DNR 6/08 change back to full code 6/09 change to prednisone  Consults:    Procedures:    Significant Diagnostic Tests:    Micro Data:  SARS CoV2 5/31 >> negative Sputum 6/02 >> oral flora  Antimicrobials:  Levaquin 6/01 Zithromax 6/02 >> 6/06  Interim history/subjective:  Coughing less.  Breathing slowly improving.  Winded with walking.  Has only really been walking in her room.  Feels like she is getting closer to discharge.  Objective   Blood pressure (!) 115/57, pulse 87, temperature 98 F (36.7 C), temperature source Oral, resp. rate 19, height 5\' 5"  (1.651 m), weight 44.5 kg, SpO2 98 %.        Intake/Output Summary (Last 24 hours) at 03/10/2020 1327 Last data filed at 03/10/2020 0900 Gross per 24 hour  Intake 240 ml  Output 200 ml  Net 40 ml   Filed Weights   03/01/20 1704 03/01/20 2300  Weight: 44 kg 44.5 kg    Examination:  General - alert Eyes - pupils reactive ENT - no sinus tenderness, no stridor Cardiac - regular rate/rhythm, no murmur Chest - decreased breath sounds, better air movement, faint b/l wheezing Abdomen - soft, non tender, + bowel sounds Extremities - no cyanosis, clubbing, or edema Skin - no rashes Neuro - normal strength, moves extremities, follows commands Psych - normal mood and behavior  Resolved  Hospital Problem list     Assessment & Plan:   Acute on chronic hypoxic/hypercapnic respiratory failure from COPD exacerbation with 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 - keep up protein and calorie intake  Tobacco abuse. - encouraged her to stick with plan about not resuming cigarettes  Anxiety with depression. 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 - continue lyrica, prn percocet  Best practice:  Diet: regular DVT prophylaxis: lovenox GI prophylaxis: not indicated Mobility: as tolerated Code Status: full code  D/w Dr. Dyann Kief.  If she tolerates transition to prednisone, then she might be ready for d/c home in next 24 hours from pulmonary stand point.    Labs:   CMP Latest Ref Rng & Units 03/08/2020 03/06/2020 03/04/2020  Glucose 70 - 99 mg/dL - 132(H) 149(H)  BUN 8 - 23 mg/dL - 24(H) 23  Creatinine 0.44 - 1.00 mg/dL 0.59 0.75 0.60  Sodium 135 - 145 mmol/L - 136 135  Potassium 3.5 - 5.1 mmol/L - 4.1 3.5  Chloride 98 - 111 mmol/L - 92(L) 91(L)  CO2 22 - 32 mmol/L - 35(H) 34(H)  Calcium 8.9 - 10.3 mg/dL - 8.7(L) 9.1  Total Protein  6.5 - 8.1 g/dL - - -  Total Bilirubin 0.3 - 1.2 mg/dL - - -  Alkaline Phos 38 - 126 U/L - - -  AST 15 - 41 U/L - - -  ALT 0 - 44 U/L - - -    CBC Latest Ref Rng & Units 03/04/2020 03/02/2020 03/01/2020  WBC 4.0 - 10.5 K/uL 5.5 1.7(L) 4.0  Hemoglobin 12.0 - 15.0 g/dL 10.2(L) 12.2 12.0  Hematocrit 36.0 - 46.0 % 32.8(L) 38.1 37.9  Platelets 150 - 400 K/uL 298 292 266    ABG    Component Value Date/Time   PHART 7.412 08/25/2019 0045   PCO2ART 48.7 (H) 08/25/2019 0045   PO2ART 73.7 (L) 08/25/2019 0045   HCO3 29.2 (H)  08/25/2019 0045   O2SAT 95.3 08/25/2019 0045    Signature:  Chesley Mires, MD [place] Pager - [phone] 03/10/2020, 1:27 PM

## 2020-03-11 MED ORDER — NYSTATIN 100000 UNIT/ML MT SUSP
5.0000 mL | Freq: Four times a day (QID) | OROMUCOSAL | Status: DC
  Administered 2020-03-11 – 2020-03-12 (×6): 500000 [IU] via ORAL
  Filled 2020-03-11 (×6): qty 5

## 2020-03-11 MED ORDER — IPRATROPIUM-ALBUTEROL 0.5-2.5 (3) MG/3ML IN SOLN
3.0000 mL | Freq: Four times a day (QID) | RESPIRATORY_TRACT | Status: DC
  Administered 2020-03-12 (×2): 3 mL via RESPIRATORY_TRACT
  Filled 2020-03-11 (×2): qty 3

## 2020-03-11 NOTE — Progress Notes (Signed)
PROGRESS NOTE    Amy Rubio  WIO:[id number] DOB: June 07, 1951 DOA: 03/01/2020 PCP: Redmond School, MD   Brief Narrative:   69 year old female with a history of hypertension, chronic respiratory failure on 3 L, COPD, chronic pain, hyperlipidemia, depression, anxiety, tobacco abuse presenting with 3-day history of shortness of breath and increasing wheeze. She is complaining of increasing generalized weakness. Unfortunately, the patient is not been using her nebulizer machine as directed. She turned her oxygen up to 5 L without improvement. As result she presented for further evaluation. She has received both of her COVID-19 vaccinations. She denies any fevers, chills, chest pain, nausea, vomiting, diarrhea,abdpain.  Assessment & Plan:   Active Problems:   COPD exacerbation (HCC)   Protein-calorie malnutrition, severe   Goals of care, counseling/discussion   Acute on chronic respiratory failure with hypoxia (HCC)   Palliative care by specialist   Acute on chronic respiratory failure with hypoxia -Secondary to COPD exacerbation -Presently stable on4L nasal cannula -The patient has very little pulmonary reserve--minimal exertion causes significant shortness of breath -goal is to keep oxygenation 88-92% -6/8--personally reviewed CXR--interstitial thickening without consolidation  COPD exacerbation -continue pulmicort -Continueduo nebs -Continue steroids; transitioning to oral prednisone today. -The patient has a degree of vocal cord dysfunction causing her wheezing -continue Flutter valve -Continue Robitussin/hycodan -pulmonary consult appreciated-->continue added use of hypertonic saline -Continue on oral prednisone as prescribed for total 7-day course -Continues to have significant weakness requiring PT/OT evaluation  Severe protein calorie malnutrition -Continue feeding supplements -Advised to maintain adequate oral hydration and nutrition. -PT/OT evaluation  ordered and pending  Chronic pain syndrome -Continue home dose of Percocet -Overall with well-controlled pain.  Essential hypertension -d/c lisinopril -Continue amlodipine -Follow vital signs and further adjust antihypertensive regimen as needed.  Depression/anxiety -Continue home dose of fluoxetine  -increase alprazolam to q 6 hour prn   GOC -Extensive discussion of goals of care by Dr. Carles Collet and palliative care service -Currently plan is for continue aggressive management and care -Patient is a full code.   Status is: Inpatient    Dispo: The patient is from:Home Anticipated d/c is EQ:ASTM Anticipated d/c date is: 1 day Patient currentlyis not medically stable to d/c.  Still having difficulty speaking in full sentences and short of breath; will attempt transition to oral steroids can continue nebulizer management.  Continue hypertonic saline as indicated by pulmonologist.  Continue oxygen supplementation.  PT/OT evaluation ordered and pending.     Family Communication:noFamily at bedside  Consultants:Pulmonologist and palliative care service  Code Status: Full code  DVT Prophylaxis: Lebam Lovenox   Procedures: As Listed in Progress Note Above  Antibiotics: levoflox6/1>>6/2 azithro 6/2>>6/6  Subjective: Patient seen and evaluated today some ongoing shortness of breath and wheezing noted.  She states that she is feeling very weak.  Objective: Vitals:   03/11/20 0926 03/11/20 0933 03/11/20 1254 03/11/20 1509  BP:   (!) 113/54   Pulse:   86   Resp:   18   Temp:   98.4 F (36.9 C)   TempSrc:   Oral   SpO2: 100% 100% 99% 98%  Weight:      Height:       No intake or output data in the 24 hours ending 03/11/20 1605 Filed Weights   03/01/20 1704 03/01/20 2300  Weight: 44 kg 44.5 kg    Examination:  General exam: Appears calm and comfortable, cachectic Respiratory system: Clear to  auscultation. Respiratory effort normal.  Ongoing wheezing, on 4 L nasal  cannula oxygen. Cardiovascular system: S1 & S2 heard, RRR. No JVD, murmurs, rubs, gallops or clicks. No pedal edema. Gastrointestinal system: Abdomen is nondistended, soft and nontender. No organomegaly or masses felt. Normal bowel sounds heard. Central nervous system: Alert and oriented. No focal neurological deficits. Extremities: No edema Skin: No rashes, lesions or ulcers Psychiatry: Flat affect    Data Reviewed: I have personally reviewed following labs and imaging studies  CBC: No results for input(s): WBC, NEUTROABS, HGB, HCT, MCV, PLT in the last 168 hours. Basic Metabolic Panel: Recent Labs  Lab 03/06/20 0659 03/08/20 0528  NA 136  --   K 4.1  --   CL 92*  --   CO2 35*  --   GLUCOSE 132*  --   BUN 24*  --   CREATININE 0.75 0.59  CALCIUM 8.7*  --   MG 2.1  --   PHOS 3.4  --    GFR: Estimated Creatinine Clearance: 46.6 mL/min (by C-G formula based on SCr of 0.59 mg/dL). Liver Function Tests: No results for input(s): AST, ALT, ALKPHOS, BILITOT, PROT, ALBUMIN in the last 168 hours. No results for input(s): LIPASE, AMYLASE in the last 168 hours. No results for input(s): AMMONIA in the last 168 hours. Coagulation Profile: No results for input(s): INR, PROTIME in the last 168 hours. Cardiac Enzymes: No results for input(s): CKTOTAL, CKMB, CKMBINDEX, TROPONINI in the last 168 hours. BNP (last 3 results) No results for input(s): PROBNP in the last 8760 hours. HbA1C: No results for input(s): HGBA1C in the last 72 hours. CBG: No results for input(s): GLUCAP in the last 168 hours. Lipid Profile: No results for input(s): CHOL, HDL, LDLCALC, TRIG, CHOLHDL, LDLDIRECT in the last 72 hours. Thyroid Function Tests: No results for input(s): TSH, T4TOTAL, FREET4, T3FREE, THYROIDAB in the last 72 hours. Anemia Panel: No results for input(s): VITAMINB12, FOLATE, FERRITIN, TIBC, IRON, RETICCTPCT in the last  72 hours. Sepsis Labs: No results for input(s): PROCALCITON, LATICACIDVEN in the last 168 hours.  Recent Results (from the past 240 hour(s))  SARS Coronavirus 2 by RT PCR (hospital order, performed in [facility] hospital lab) Nasopharyngeal Nasopharyngeal Swab     Status: None   Collection Time: 03/01/20  6:20 PM   Specimen: Nasopharyngeal Swab  Result Value Ref Range Status   SARS Coronavirus 2 NEGATIVE NEGATIVE Final    Comment: (NOTE) SARS-CoV-2 target nucleic acids are NOT DETECTED. The SARS-CoV-2 RNA is generally detectable in upper and lower respiratory specimens during the acute phase of infection. The lowest concentration of SARS-CoV-2 viral copies this assay can detect is 250 copies / mL. A negative result does not preclude SARS-CoV-2 infection and should not be used as the sole basis for treatment or other patient management decisions.  A negative result may occur with improper specimen collection / handling, submission of specimen other than nasopharyngeal swab, presence of viral mutation(s) within the areas targeted by this assay, and inadequate number of viral copies (<250 copies / mL). A negative result must be combined with clinical observations, patient history, and epidemiological information. Fact Sheet for Patients:   StrictlyIdeas.no Fact Sheet for Healthcare Providers: BankingDealers.co.za This test is not yet approved or cleared  by the [place] FDA and has been authorized for detection and/or diagnosis of SARS-CoV-2 by FDA under an Emergency Use Authorization (EUA).  This EUA will remain in effect (meaning this test can be used) for the duration of the COVID-19 declaration under Section 564(b)(1) of the Act, 21 U.S.C. section  360bbb-3(b)(1), unless the authorization is terminated or revoked sooner. Performed at [facility], [address], [place], [place] [address]   Expectorated sputum assessment w rflx  to resp cult     Status: None   Collection Time: 03/03/20  9:00 AM   Specimen: Expectorated Sputum  Result Value Ref Range Status   Specimen Description EXPECTORATED SPUTUM  Final   Special Requests NONE  Final   Sputum evaluation   Final    THIS SPECIMEN IS ACCEPTABLE FOR SPUTUM CULTURE Performed at [facility], [address], [place], [place] [address]    Report Status 03/03/2020 FINAL  Final  Culture, respiratory     Status: None   Collection Time: 03/03/20  9:00 AM  Result Value Ref Range Status   Specimen Description   Final    EXPECTORATED SPUTUM Performed at [facility], [address] [place], [place] [address]    Special Requests   Final    NONE Reflexed from [id number] Performed at [facility], [address], [place], [place] [address]    Gram Stain   Final    FEW WBC PRESENT, PREDOMINANTLY PMN MODERATE GRAM POSITIVE COCCI IN PAIRS IN CHAINS FEW GRAM POSITIVE RODS    Culture   Final    ABUNDANT Consistent with normal respiratory flora. Performed at [facility], [place] [address], [place], [place] [address]    Report Status 03/05/2020 FINAL  Final         Radiology Studies: No results found.      Scheduled Meds: . amLODipine  5 mg Oral Daily  . budesonide (PULMICORT) nebulizer solution  0.5 mg Nebulization BID  . enoxaparin (LOVENOX) injection  30 mg Subcutaneous QHS  . feeding supplement (ENSURE ENLIVE)  237 mL Oral BID BM  . FLUoxetine  40 mg Oral Daily  . guaiFENesin  1,200 mg Oral BID  . hydrochlorothiazide  12.5 mg Oral Daily  . ipratropium-albuterol  3 mL Nebulization Q6H  . multivitamin with minerals  1 tablet Oral Daily  . nystatin  5 mL Oral QID  . predniSONE  40 mg Oral Q breakfast  . pregabalin  200 mg Oral BID  . sodium chloride HYPERTONIC  4 mL Nebulization BID    LOS: 9 days    Time spent: 30 minutes    Satvik Parco Darleen Crocker, DO [facility]  If 7PM-7AM, please contact night-coverage [email or web address] 03/11/2020, 4:05 PM

## 2020-03-11 NOTE — Plan of Care (Signed)

## 2020-03-11 NOTE — Progress Notes (Signed)
NAME:  Amy COGGIN, MRN:  [id number], DOB:  Mar 14, 1951, LOS: 9 ADMISSION DATE:  03/01/2020, CONSULTATION DATE:  03/08/2020 REFERRING MD:  Dr. Carles Collet, [facility], CHIEF COMPLAINT:  Shortness of breath  Brief History   69 yo female smoker presented to the ER on 03/01/20 with dyspnea and wheezing from COPD exacerbation.  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  Past Medical History  HTN, Chronic pain, HLD, Depression, Anxiety, Polio, Osteoporosis, GERD, Esophageal stricture, Fibromyalgia, Colon polyp  Significant Hospital Events   5/31 Admit 6/06 change to DNR 6/08 change back to full code 6/09 change to prednisone  Consults:    Procedures:    Significant Diagnostic Tests:    Micro Data:  SARS CoV2 5/31 >> negative Sputum 6/02 >> oral flora  Antimicrobials:  Levaquin 6/01 Zithromax 6/02 >> 6/06  Interim history/subjective:  Breathing better.  Still has cough.  Feels weak.  Objective   Blood pressure (!) 113/54, pulse 86, temperature 98.4 F (36.9 C), temperature source Oral, resp. rate 18, height 5\' 5"  (1.651 m), weight 44.5 kg, SpO2 98 %.       No intake or output data in the 24 hours ending 03/11/20 1516 Filed Weights   03/01/20 1704 03/01/20 2300  Weight: 44 kg 44.5 kg    Examination:  General - alert Eyes - pupils reactive ENT - no sinus tenderness, no stridor Cardiac - regular rate/rhythm, no murmur Chest - decreased BS, scattered rhonchi, no wheeze Abdomen - soft, non tender, + bowel sounds Extremities - no cyanosis, clubbing, or edema Skin - no rashes Neuro - normal strength, moves extremities, follows commands Psych - normal mood and behavior   Resolved Hospital Problem list     Assessment & Plan:   Acute on chronic hypoxic/hypercapnic respiratory failure from COPD exacerbation with 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 - keep up protein and calorie intake  Tobacco abuse. - encouraged her to stick with plan about not resuming cigarettes  Anxiety with depression. 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 - continue lyrica, prn percocet  Deconditioning. - PT/OT to assess  Best practice:  Diet: regular DVT prophylaxis: lovenox GI prophylaxis: not indicated Mobility: as tolerated Code Status: full code Disposition: pulmonary status stable for d/c.  Need to be assessed by PT/OT to determine if she will need SNF before going home.  Labs:   CMP Latest Ref Rng & Units 03/08/2020 03/06/2020 03/04/2020  Glucose 70 - 99 mg/dL - 132(H) 149(H)  BUN 8 - 23 mg/dL - 24(H) 23  Creatinine 0.44 - 1.00 mg/dL 0.59 0.75 0.60  Sodium 135 - 145 mmol/L - 136 135  Potassium 3.5 - 5.1 mmol/L - 4.1 3.5  Chloride 98 - 111 mmol/L - 92(L) 91(L)  CO2 22 - 32 mmol/L - 35(H) 34(H)  Calcium 8.9 - 10.3 mg/dL - 8.7(L) 9.1  Total Protein 6.5 - 8.1 g/dL - - -  Total Bilirubin 0.3 - 1.2 mg/dL - - -  Alkaline Phos 38 - 126 U/L - - -  AST 15 - 41 U/L - - -  ALT 0 - 44 U/L - - -  CBC Latest Ref Rng & Units 03/04/2020 03/02/2020 03/01/2020  WBC 4.0 - 10.5 K/uL 5.5 1.7(L) 4.0  Hemoglobin 12.0 - 15.0 g/dL 10.2(L) 12.2 12.0  Hematocrit 36 - 46 % 32.8(L) 38.1 37.9  Platelets 150 - 400 K/uL 298 292 266    ABG    Component Value Date/Time   PHART 7.412 08/25/2019 0045   PCO2ART 48.7 (H) 08/25/2019 0045   PO2ART 73.7 (L) 08/25/2019 0045   HCO3 29.2 (H) 08/25/2019 0045   O2SAT 95.3 08/25/2019 0045    Signature:  Chesley Mires, MD [place] Pager - [phone] 03/11/2020, 3:16 PM

## 2020-03-11 NOTE — Plan of Care (Signed)

## 2020-03-12 ENCOUNTER — Telehealth: Payer: Self-pay | Admitting: Pulmonary Disease

## 2020-03-12 DIAGNOSIS — Z7189 Other specified counseling: Secondary | ICD-10-CM | POA: Diagnosis not present

## 2020-03-12 DIAGNOSIS — J9621 Acute and chronic respiratory failure with hypoxia: Secondary | ICD-10-CM | POA: Diagnosis not present

## 2020-03-12 DIAGNOSIS — J441 Chronic obstructive pulmonary disease with (acute) exacerbation: Secondary | ICD-10-CM | POA: Diagnosis not present

## 2020-03-12 DIAGNOSIS — E43 Unspecified severe protein-calorie malnutrition: Secondary | ICD-10-CM | POA: Diagnosis not present

## 2020-03-12 LAB — CBC
HCT: 33.1 % — ABNORMAL LOW (ref 36.0–46.0)
Hemoglobin: 10.2 g/dL — ABNORMAL LOW (ref 12.0–15.0)
MCH: 32.3 pg (ref 26.0–34.0)
MCHC: 30.8 g/dL (ref 30.0–36.0)
MCV: 104.7 fL — ABNORMAL HIGH (ref 80.0–100.0)
Platelets: 290 10*3/uL (ref 150–400)
RBC: 3.16 MIL/uL — ABNORMAL LOW (ref 3.87–5.11)
RDW: 15.6 % — ABNORMAL HIGH (ref 11.5–15.5)
WBC: 9.9 10*3/uL (ref 4.0–10.5)
nRBC: 0 % (ref 0.0–0.2)

## 2020-03-12 LAB — BASIC METABOLIC PANEL
Anion gap: 10 (ref 5–15)
BUN: 23 mg/dL (ref 8–23)
CO2: 35 mmol/L — ABNORMAL HIGH (ref 22–32)
Calcium: 8.7 mg/dL — ABNORMAL LOW (ref 8.9–10.3)
Chloride: 94 mmol/L — ABNORMAL LOW (ref 98–111)
Creatinine, Ser: 0.38 mg/dL — ABNORMAL LOW (ref 0.44–1.00)
GFR calc Af Amer: >60 mL/min (ref >60)
GFR calc non Af Amer: >60 mL/min (ref >60)
Glucose, Bld: 86 mg/dL (ref 70–99)
Potassium: 4.2 mmol/L (ref 3.5–5.1)
Sodium: 139 mmol/L (ref 135–145)

## 2020-03-12 MED ORDER — PREDNISONE 20 MG PO TABS: 40.0000 mg | ORAL_TABLET | Freq: Every day | ORAL | 0 refills | Status: AC

## 2020-03-12 MED ORDER — ALPRAZOLAM 0.5 MG PO TABS: 0.5000 mg | ORAL_TABLET | Freq: Four times a day (QID) | ORAL | 0 refills | Status: AC | PRN

## 2020-03-12 NOTE — Care Management Important Message (Signed)
Important Message  Patient Details  Name: Amy Rubio MRN: [id number] Date of Birth: 11/24/1950   Medicare Important Message Given:  Yes     [name] 03/12/2020, 1:39 PM

## 2020-03-12 NOTE — Clinical Social Work Note (Signed)
Patient recommended for HHPT. No agencies accepting [facility] for [facility] at this time. Referred to OPPT in [place]. Patient aware.    Sher Hellinger, Clydene Pugh, LCSW

## 2020-03-12 NOTE — Discharge Summary (Signed)
Physician Discharge Summary  Amy Rubio TOI:[id number] DOB: 1950/12/17 DOA: 03/01/2020  PCP: Redmond School, MD  Admit date: 03/01/2020  Discharge date: 03/12/2020  Admitted From:Home  Disposition:  Home  Recommendations for Outpatient Follow-up:  1. Follow up with PCP in 1-2 weeks 2. Please consider arranging follow-up with outpatient palliative care. 3. Follow-up with Dr. Halford Chessman with pulmonology group in [place] in 1-2 weeks 4. Continue on prednisone as prescribed for 5 more days to complete full 7-day taper 5. Continue on home breathing treatments as prescribed 6. Patient given prescription for Xanax as needed for anxiety  Home Health: Yes with PT and rolling walker  Equipment/Devices: Has home 3 L nasal cannula oxygen  Discharge Condition: Stable  CODE STATUS: Full  Diet recommendation: Heart Healthy  Brief/Interim Summary: Per HPI: 69 year old female with a history of hypertension, chronic respiratory failure on 3 L, COPD, chronic pain, hyperlipidemia, depression, anxiety, tobacco abuse presenting with 3-day history of shortness of breath and increasing wheeze. She is complaining of increasing generalized weakness. Unfortunately, the patient is not been using her nebulizer machine as directed. She turned her oxygen up to 5 L without improvement. As result she presented for further evaluation. She has received both of her COVID-19 vaccinations. She denies any fevers, chills, chest pain, nausea, vomiting, diarrhea,abdpain.  -Patient was admitted with acute on chronic hypoxemic respiratory failure secondary to COPD exacerbation.  She had been followed by pulmonology on account of the severity of her symptoms.  She is also noted to have severe protein calorie malnutrition and degree of vocal cord dysfunction.  She has been transition from IV to p.o. steroids which she has been tolerating well.  She did have a prolonged course of stay and given her comorbidities of severe  COPD, protein calorie malnutrition, chronic pain as well as depression and anxiety, palliative care had also seen the patient.  They are recommending outpatient follow-up for palliative care at this time.  She has been assessed by physical therapy with recommendations for home health physical therapy as well as rolling walker on account of her weakness.  Pulmonology recommends continuing prednisone taper as prescribed with home breathing treatments and close follow-up in the outpatient setting.  She is overall feeling better today and appears stable for discharge.  Discharge Diagnoses:  Active Problems:   COPD exacerbation (HCC)   Protein-calorie malnutrition, severe   Goals of care, counseling/discussion   Acute on chronic respiratory failure with hypoxia [facility])   Palliative care by specialist  Principal discharge diagnosis: Acute on chronic hypoxemic respiratory failure secondary to COPD exacerbation.  Discharge Instructions  Discharge Instructions    Diet - low sodium heart healthy   Complete by: As directed    Increase activity slowly   Complete by: As directed      Allergies as of 03/12/2020   No Known Allergies     Medication List    TAKE these medications   albuterol 108 (90 Base) MCG/ACT inhaler Commonly known as: Ventolin HFA INHALE 2 PUFFS INTO THE LUNGS EVERY 4 HOURS AS NEEDED FOR WHEEZING ORSHORTNESS OF BREATH. What changed:   how much to take  how to take this  when to take this  reasons to take this   albuterol (2.5 MG/3ML) 0.083% nebulizer solution Commonly known as: PROVENTIL Take 3 mLs (2.5 mg total) by nebulization every 4 (four) hours as needed for wheezing or shortness of breath. What changed: when to take this   ALPRAZolam 0.5 MG tablet Commonly known as: Duanne Moron  Take 1 tablet (0.5 mg total) by mouth every 6 (six) hours as needed for anxiety.   feeding supplement (ENSURE ENLIVE) Liqd Take 237 mLs by mouth 2 (two) times daily between meals.    FLUoxetine 40 MG capsule Commonly known as: PROZAC Take 40 mg by mouth daily.   guaiFENesin 600 MG 12 hr tablet Commonly known as: MUCINEX Take 1 tablet (600 mg total) by mouth 2 (two) times daily. What changed: when to take this   ipratropium 0.02 % nebulizer solution Commonly known as: ATROVENT Take 3 mLs by nebulization 4 (four) times daily.   lisinopril-hydrochlorothiazide 10-12.5 MG tablet Commonly known as: ZESTORETIC Take 1 tablet by mouth daily.   multivitamin with minerals Tabs tablet Take 1 tablet by mouth daily.   omeprazole 20 MG capsule Commonly known as: PRILOSEC Take 1 capsule (20 mg total) by mouth daily. What changed:   when to take this  reasons to take this   oxyCODONE-acetaminophen 7.5-325 MG tablet Commonly known as: PERCOCET Take 1 tablet by mouth 4 (four) times daily as needed.   predniSONE 20 MG tablet Commonly known as: DELTASONE Take 2 tablets (40 mg total) by mouth daily with breakfast for 5 days. Start taking on: March 13, 2020   pregabalin 200 MG capsule Commonly known as: LYRICA Take 200 mg by mouth 2 (two) times daily.   QUEtiapine 50 MG tablet Commonly known as: SEROQUEL Take 50 mg by mouth daily.   traZODone 50 MG tablet Commonly known as: DESYREL Take 1 tablet (50 mg total) by mouth at bedtime. What changed:   when to take this  reasons to take this   Trelegy Ellipta 100-62.5-25 MCG/INH Aepb Generic drug: Fluticasone-Umeclidin-Vilant Take 1 puff by mouth daily.            Durable Medical Equipment  (From admission, onward)         Start     Ordered   03/12/20 1135  DME Walker  Once       Question Answer Comment  Walker: With 5 Inch Wheels   Patient needs a walker to treat with the following condition Weakness      03/12/20 1134          Follow-up Information    Redmond School, MD Follow up in 1 week(s).   Specialty: Internal Medicine Contact information: [address] [place]  [address] [phone]        [facility] Follow up in 1 week(s).   Specialty: Pulmonology Contact information: [address] [place] [address] [phone]             No Known Allergies  Consultations:  Pulmonology  Palliative care   Procedures/Studies: DG CHEST PORT 1 VIEW  Result Date: 03/09/2020 CLINICAL DATA:  Respiratory distress EXAM: PORTABLE CHEST 1 VIEW COMPARISON:  Mar 01, 2020 FINDINGS: Lungs remain hyperexpanded. Apparent nipple shadow right base. No edema or airspace opacity. There is mild scarring and interstitial thickening, stable. No pleural effusions are evident. Heart size and pulmonary vascularity normal. There is aortic atherosclerosis. 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 No bone lesions. IMPRESSION: Lungs hyperexpanded with areas of mild scarring and interstitial thickening, likely due to underlying chronic bronchitis. No frank edema or consolidation. Presumed nipple shadow on the right, stable. Repeat study with nipple markers potentially could be helpful to confirm. 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 Stable cardiac silhouette. No adenopathy. Aortic Atherosclerosis (ICD10-I70.0). Electronically Signed  By: Lowella Grip III M.D.   On: 03/09/2020 13:27   DG Chest Portable 1 View  Result Date: 03/01/2020 CLINICAL DATA:  Shortness of breath and wheezing for 3 days. EXAM: PORTABLE CHEST 1 VIEW COMPARISON:  Radiograph 09/29/2019, chest CT 04/28/2016 FINDINGS: Chronic hyperinflation and bronchial thickening. Normal heart size and mediastinal contours. Aortic atherosclerosis. Left basilar Bochdalek hernia. Presumed nipple shadows projecting over the lung bases. No focal airspace disease, pulmonary edema, pleural effusion or pneumothorax. No acute osseous abnormalities are seen. IMPRESSION: 1. Chronic hyperinflation and bronchial thickening, consistent with COPD. No acute  abnormality. 2. Presumed nipple shadows projecting over the lung bases, also seen on prior exam. Aortic Atherosclerosis (ICD10-I70.0). Electronically Signed   By: Keith Rake M.D.   On: 03/01/2020 17:36      Discharge Exam: Vitals:   03/12/20 0459 03/12/20 0752  BP: 131/69   Pulse: 81   Resp: 16   Temp: (!) 97.5 F (36.4 C)   SpO2: 98% 99%   Vitals:   03/11/20 1948 03/11/20 2110 03/12/20 0459 03/12/20 0752  BP:  (!) 124/59 131/69   Pulse:  88 81   Resp:  16 16   Temp:  99.4 F (37.4 C) (!) 97.5 F (36.4 C)   TempSrc:  Oral Oral   SpO2: 97% 99% 98% 99%  Weight:      Height:        General: Pt is alert, awake, not in acute distress Cardiovascular: RRR, S1/S2 +, no rubs, no gallops Respiratory: CTA bilaterally, no wheezing, no rhonchi, currently on 3 L nasal cannula oxygen Abdominal: Soft, NT, ND, bowel sounds + Extremities: no edema, no cyanosis    The results of significant diagnostics from this hospitalization (including imaging, microbiology, ancillary and laboratory) are listed below for reference.     Microbiology: Recent Results (from the past 240 hour(s))  Expectorated sputum assessment w rflx to resp cult     Status: None   Collection Time: 03/03/20  9:00 AM   Specimen: Expectorated Sputum  Result Value Ref Range Status   Specimen Description EXPECTORATED SPUTUM  Final   Special Requests NONE  Final   Sputum evaluation   Final    THIS SPECIMEN IS ACCEPTABLE FOR SPUTUM CULTURE Performed at [facility], [address] [place], [place] [address]    Report Status 03/03/2020 FINAL  Final  Culture, respiratory     Status: None   Collection Time: 03/03/20  9:00 AM  Result Value Ref Range Status   Specimen Description   Final    EXPECTORATED SPUTUM Performed at [facility], [address], [place], [place] [address]    Special Requests   Final    NONE Reflexed from [id number] Performed at [facility], [address], [place], [place] [address]    Gram  Stain   Final    FEW WBC PRESENT, PREDOMINANTLY PMN MODERATE GRAM POSITIVE COCCI IN PAIRS IN CHAINS FEW GRAM POSITIVE RODS    Culture   Final    ABUNDANT Consistent with normal respiratory flora. Performed at [facility], [place] [address], [place], [place] [address]    Report Status 03/05/2020 FINAL  Final     Labs: BNP (last 3 results) No results for input(s): BNP in the last 8760 hours. Basic Metabolic Panel: Recent Labs  Lab 03/06/20 0659 03/08/20 0528 03/12/20 0551  NA 136  --  139  K 4.1  --  4.2  CL 92*  --  94*  CO2 35*  --  35*  GLUCOSE 132*  --  86  BUN 24*  --  23  CREATININE 0.75 0.59 0.38*  CALCIUM 8.7*  --  8.7*  MG 2.1  --   --   PHOS 3.4  --   --    Liver Function Tests: No results for input(s): AST, ALT, ALKPHOS, BILITOT, PROT, ALBUMIN in the last 168 hours. No results for input(s): LIPASE, AMYLASE in the last 168 hours. No results for input(s): AMMONIA in the last 168 hours. CBC: Recent Labs  Lab 03/12/20 0551  WBC 9.9  HGB 10.2*  HCT 33.1*  MCV 104.7*  PLT 290   Cardiac Enzymes: No results for input(s): CKTOTAL, CKMB, CKMBINDEX, TROPONINI in the last 168 hours. BNP: Invalid input(s): POCBNP CBG: No results for input(s): GLUCAP in the last 168 hours. D-Dimer No results for input(s): DDIMER in the last 72 hours. Hgb A1c No results for input(s): HGBA1C in the last 72 hours. Lipid Profile No results for input(s): CHOL, HDL, LDLCALC, TRIG, CHOLHDL, LDLDIRECT in the last 72 hours. Thyroid function studies No results for input(s): TSH, T4TOTAL, T3FREE, THYROIDAB in the last 72 hours.  Invalid input(s): FREET3 Anemia work up No results for input(s): VITAMINB12, FOLATE, FERRITIN, TIBC, IRON, RETICCTPCT in the last 72 hours. Urinalysis    Component Value Date/Time   COLORURINE STRAW (A) 08/25/2019 0103   APPEARANCEUR CLEAR 08/25/2019 0103   LABSPEC 1.003 (L) 08/25/2019 0103   PHURINE 7.0 08/25/2019 0103   GLUCOSEU NEGATIVE 08/25/2019  0103   HGBUR NEGATIVE 08/25/2019 0103   BILIRUBINUR NEGATIVE 08/25/2019 0103   KETONESUR NEGATIVE 08/25/2019 0103   PROTEINUR NEGATIVE 08/25/2019 0103   NITRITE NEGATIVE 08/25/2019 0103   LEUKOCYTESUR NEGATIVE 08/25/2019 0103   Sepsis Labs Invalid input(s): PROCALCITONIN,  WBC,  LACTICIDVEN Microbiology Recent Results (from the past 240 hour(s))  Expectorated sputum assessment w rflx to resp cult     Status: None   Collection Time: 03/03/20  9:00 AM   Specimen: Expectorated Sputum  Result Value Ref Range Status   Specimen Description EXPECTORATED SPUTUM  Final   Special Requests NONE  Final   Sputum evaluation   Final    THIS SPECIMEN IS ACCEPTABLE FOR SPUTUM CULTURE Performed at [facility], [address], [place], [place] [address]    Report Status 03/03/2020 FINAL  Final  Culture, respiratory     Status: None   Collection Time: 03/03/20  9:00 AM  Result Value Ref Range Status   Specimen Description   Final    EXPECTORATED SPUTUM Performed at [facility], [address] [place], [place] [address]    Special Requests   Final    NONE Reflexed from [id number] Performed at [facility], [address], [place], [place] [address]    Gram Stain   Final    FEW WBC PRESENT, PREDOMINANTLY PMN MODERATE GRAM POSITIVE COCCI IN PAIRS IN CHAINS FEW GRAM POSITIVE RODS    Culture   Final    ABUNDANT Consistent with normal respiratory flora. Performed at [facility], [place] [address] [place], [place] [address]    Report Status 03/05/2020 FINAL  Final     Time coordinating discharge: Over 35 minutes  SIGNED:   Rodena Goldmann, DO [facility] 03/12/2020, 11:35 AM  If 7PM-7AM, please contact night-coverage [email or web address]

## 2020-03-12 NOTE — Evaluation (Signed)
Occupational Therapy Evaluation Patient Details Name: Amy Rubio MRN: [id number] DOB: July 05, 1951 Today's Date: 03/12/2020    History of Present Illness 69 yo female smoker presented to the ER on 03/01/20 with dyspnea and wheezing from COPD exacerbation.  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   Clinical Impression   PTA, pt was living with her son and performing ADLs and IADLs; using SPC for mobility. Pt demonstrating poor strength, balance, and activity tolerance. Pt currently requiring Min Guard -Min A for LB ADLs and functional mobility with SPC. SpO2 >88% on 4L during activity. Recommend dc to home with HHOT for further OT to optimize safety, independence with ADLs, and return to PLOF. Pt planning for possible dc later today. Providing education and answered questions in preparation.      Follow Up Recommendations  Home health OT;Supervision/Assistance - 24 hour    Equipment Recommendations  None recommended by OT    Recommendations for Other Services PT consult     Precautions / Restrictions Precautions Precautions: Fall Restrictions Weight Bearing Restrictions: No      Mobility Bed Mobility Overal bed mobility: Needs Assistance Bed Mobility: Supine to Sit     Supine to sit: Supervision;HOB elevated     General bed mobility comments: Supervision for safety  Transfers Overall transfer level: Needs assistance Equipment used: Straight cane Transfers: Sit to/from Stand Sit to Stand: Min guard Stand pivot transfers: Supervision       General transfer comment: Min Guard A for safety    Balance Overall balance assessment: Needs assistance Sitting-balance support: No upper extremity supported;Feet supported Sitting balance-Leahy Scale: Good Sitting balance - Comments: seated in chair   Standing balance support: No upper extremity supported;During functional activity;Single  extremity supported Standing balance-Leahy Scale: Fair Standing balance comment: fair/poor with SPC, improves with RW during ambulation                           ADL either performed or assessed with clinical judgement   ADL Overall ADL's : Needs assistance/impaired Eating/Feeding: Set up;Sitting   Grooming: Set up;Supervision/safety;Standing   Upper Body Bathing: Set up;Supervision/ safety;Sitting   Lower Body Bathing: Minimal assistance;Sit to/from stand   Upper Body Dressing : Set up;Supervision/safety;Sitting   Lower Body Dressing: Minimal assistance;Sit to/from stand Lower Body Dressing Details (indicate cue type and reason): Min A for balance while donning shoes Toilet Transfer: Minimal assistance;Ambulation (simulated to recliner; SPC)           Functional mobility during ADLs: Minimal assistance;Moderate assistance;Cane General ADL Comments: Pt presenting with decreased balance and activity tolerance.     Vision Baseline Vision/History: Wears glasses Wears Glasses: Reading only Patient Visual Report: No change from baseline       Perception     Praxis      Pertinent Vitals/Pain Pain Assessment: 0-10 Pain Score: 7  Faces Pain Scale: Hurts a little bit Pain Location: Generalized Pain Descriptors / Indicators: Discomfort Pain Intervention(s): Monitored during session;Limited activity within patient's tolerance;Repositioned     Hand Dominance Right   Extremity/Trunk Assessment Upper Extremity Assessment Upper Extremity Assessment: Generalized weakness   Lower Extremity Assessment Lower Extremity Assessment: Defer to PT evaluation   Cervical / Trunk Assessment Cervical / Trunk Assessment: Kyphotic   Communication Communication Communication: No difficulties   Cognition Arousal/Alertness: Awake/alert Behavior During Therapy: WFL for tasks assessed/performed Overall Cognitive Status: Within Functional Limits  for tasks assessed                                  General Comments: Feel pt is close to baseline cognition. Slight decraese is safety awareness and problem solving   General Comments  SpO2 >88% on 4L O2    Exercises     Shoulder Instructions      Home Living Family/patient expects to be discharged to:: Private residence Living Arrangements: Other relatives (grandson) Available Help at Discharge: Family;Available PRN/intermittently Type of Home: House Home Access: Stairs to enter [employer] of Steps: 3 Entrance Stairs-Rails: None Home Layout: One level     Bathroom Shower/Tub: Teacher, early years/pre: Standard     Home Equipment: Cane - single point;Bedside commode (DME in storage from mother)   Additional Comments: Daughter was arrested two days ago. She has been in and out of home and not reliable      Prior Functioning/Environment Level of Independence: Independent with assistive device(s);Needs assistance  Gait / Transfers Assistance Needed: household ambulator with [facility]     Comments: Uses SPC for functional mobility         OT Problem List: Decreased range of motion;Decreased strength;Decreased activity tolerance;Decreased knowledge of use of DME or AE;Decreased knowledge of precautions;Cardiopulmonary status limiting activity      OT Treatment/Interventions:      OT Goals(Current goals can be found in the care plan section) Acute Rehab OT Goals Patient Stated Goal: Go home OT Goal Formulation: All assessment and education complete, DC therapy  OT Frequency:     Barriers to D/C:            Co-evaluation              AM-PAC OT "6 Clicks" Daily Activity     Outcome Measure Help from another person eating meals?: A Little Help from another person taking care of personal grooming?: A Little Help from another person toileting, which includes using toliet, bedpan, or urinal?: A Little Help from another person bathing (including washing,  rinsing, drying)?: A Little Help from another person to put on and taking off regular upper body clothing?: A Little Help from another person to put on and taking off regular lower body clothing?: A Little 6 Click Score: 18   End of Session Equipment Utilized During Treatment: Oxygen;Other (comment) [facility]) Nurse Communication: Mobility status  Activity Tolerance: Patient limited by fatigue Patient left: in chair;with call bell/phone within reach;with family/visitor present  OT Visit Diagnosis: Unsteadiness on feet (R26.81);Other abnormalities of gait and mobility (R26.89);Muscle weakness (generalized) (M62.81);Pain Pain - part of body:  Nanetta Batty)                Time: 4132-4401 OT Time Calculation (min): 34 min Charges:  OT General Charges $OT Visit: 1 Visit OT Evaluation $OT Eval Moderate Complexity: 1 Mod OT Treatments $Self Care/Home Management : 8-22 mins  Milynn Quirion MSOT, OTR/L Acute Rehab Pager: [phone] Office: [place] 03/12/2020, 12:51 PM

## 2020-03-12 NOTE — Progress Notes (Signed)
Nsg Discharge Note  Admit Date:  03/01/2020 Discharge date: 03/12/2020   [name] to be D/C'd home per MD order.  AVS completed.  Copy for chart, and copy for patient signed, and dated. Patient/caregiver able to verbalize understanding.  Discharge Medication: Allergies as of 03/12/2020   No Known Allergies     Medication List    TAKE these medications   albuterol 108 (90 Base) MCG/ACT inhaler Commonly known as: Ventolin HFA INHALE 2 PUFFS INTO THE LUNGS EVERY 4 HOURS AS NEEDED FOR WHEEZING ORSHORTNESS OF BREATH. What changed:   how much to take  how to take this  when to take this  reasons to take this   albuterol (2.5 MG/3ML) 0.083% nebulizer solution Commonly known as: PROVENTIL Take 3 mLs (2.5 mg total) by nebulization every 4 (four) hours as needed for wheezing or shortness of breath. What changed: when to take this   ALPRAZolam 0.5 MG tablet Commonly known as: XANAX Take 1 tablet (0.5 mg total) by mouth every 6 (six) hours as needed for anxiety.   feeding supplement (ENSURE ENLIVE) Liqd Take 237 mLs by mouth 2 (two) times daily between meals.   FLUoxetine 40 MG capsule Commonly known as: PROZAC Take 40 mg by mouth daily.   guaiFENesin 600 MG 12 hr tablet Commonly known as: MUCINEX Take 1 tablet (600 mg total) by mouth 2 (two) times daily. What changed: when to take this   ipratropium 0.02 % nebulizer solution Commonly known as: ATROVENT Take 3 mLs by nebulization 4 (four) times daily.   lisinopril-hydrochlorothiazide 10-12.5 MG tablet Commonly known as: ZESTORETIC Take 1 tablet by mouth daily.   multivitamin with minerals Tabs tablet Take 1 tablet by mouth daily.   omeprazole 20 MG capsule Commonly known as: PRILOSEC Take 1 capsule (20 mg total) by mouth daily. What changed:   when to take this  reasons to take this   oxyCODONE-acetaminophen 7.5-325 MG tablet Commonly known as: PERCOCET Take 1 tablet by mouth 4 (four) times daily as  needed.   predniSONE 20 MG tablet Commonly known as: DELTASONE Take 2 tablets (40 mg total) by mouth daily with breakfast for 5 days. Start taking on: March 13, 2020   pregabalin 200 MG capsule Commonly known as: LYRICA Take 200 mg by mouth 2 (two) times daily.   QUEtiapine 50 MG tablet Commonly known as: SEROQUEL Take 50 mg by mouth daily.   traZODone 50 MG tablet Commonly known as: DESYREL Take 1 tablet (50 mg total) by mouth at bedtime. What changed:   when to take this  reasons to take this   Trelegy Ellipta 100-62.5-25 MCG/INH Aepb Generic drug: Fluticasone-Umeclidin-Vilant Take 1 puff by mouth daily.            Durable Medical Equipment  (From admission, onward)         Start     Ordered   03/12/20 1135  DME Walker  Once       Question Answer Comment  Walker: With 5 Inch Wheels   Patient needs a walker to treat with the following condition Weakness      03/12/20 1134          Discharge Assessment: Vitals:   03/12/20 1333 03/12/20 1444  BP: 125/65   Pulse: 96   Resp: 17   Temp: 98.8 F (37.1 C)   SpO2: 96% 97%   Skin clean, dry and intact without evidence of skin break down, no evidence of skin tears noted. IV  catheter discontinued intact. Site without signs and symptoms of complications - no redness or edema noted at insertion site, patient denies c/o pain - only slight tenderness at site.  Dressing with slight pressure applied.  D/c Instructions-Education: Discharge instructions given to patient/family with verbalized understanding. D/c education completed with patient/family including follow up instructions, medication list, d/c activities limitations if indicated, with other d/c instructions as indicated by MD - patient able to verbalize understanding, all questions fully answered. Patient instructed to return to ED, call 911, or call MD for any changes in condition.  Patient escorted via [place], and D/C home via private auto.  Zachery Conch,  RN 03/12/2020 6:26 PM
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
## 2020-03-12 NOTE — Plan of Care (Signed)
  Problem: Acute Rehab PT Goals(only PT should resolve) Goal: Pt Will Ambulate Outcome: Progressing Flowsheets (Taken 03/12/2020 1102) Pt will Ambulate:  100 feet  with supervision  with least restrictive assistive device Goal: Pt/caregiver will Perform Home Exercise Program Outcome: Progressing Flowsheets (Taken 03/12/2020 1102) Pt/caregiver will Perform Home Exercise Program:  For increased strengthening  For improved balance  Independently  11:03 AM, 03/12/20 Mearl Latin PT, DPT Physical Therapist at [facility]

## 2020-03-12 NOTE — Telephone Encounter (Signed)
Patient being discharged from [facility] on 03/12/20.  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  She would then need follow up with me 2 months after that in [place] office.

## 2020-03-12 NOTE — Evaluation (Signed)
Physical Therapy Evaluation Patient Details Name: Amy Rubio MRN: [id number] DOB: 13-Mar-1951 Today's Date: 03/12/2020   History of Present Illness  Amy Rubio  is a 69 y.o. female, with past medical history of hypertension, COPD on 3 L/min of oxygen via nasal cannula came to ED with complaints of shortness of breath and wheezing.  Patient stated for past 1 week she has been having worsening shortness of breath and had to use 4 to 5 L of oxygen via nasal cannula.  Usually sees on 2 to 3 L/min of oxygen at home.  She admits to coughing up some phlegm.  Denies fever or chills.  No chest pain.She denies nausea vomiting or diarrhea.    Clinical Impression  Patient seated in chair at entrance for session. Patient transfers to standing without use of AD and is unsteady upon standing which improves with use of SPC but she continues to demonstrate unsteadiness. She ambulates with SPC with unsteady gait, gait improves with use of RW for balance. She demonstrates generalized weakness and impaired balance with standing and ambulation today. O2 monitored during session and was 95-96% on 4L supplemental O2. Patient educated on importance of using RW with weightbearing to reduce the risk of falls. Patient will benefit from continued physical therapy in hospital and recommended venue below to increase strength, balance, endurance for safe ADLs and gait.     Follow Up Recommendations Home health PT    Equipment Recommendations  Rolling walker with 5" wheels (if she does not have access to one)    Recommendations for Other Services       Precautions / Restrictions Precautions Precautions: Fall Restrictions Weight Bearing Restrictions: No      Mobility  Bed Mobility               General bed mobility comments: seated in chair at beginning of session  Transfers Overall transfer level: Needs assistance Equipment used: None Transfers: Sit to/from Stand;Stand Pivot Transfers Sit to Stand:  Supervision Stand pivot transfers: Supervision       General transfer comment: stands initially without AD, unsteady without AD, balance improves with SPC  Ambulation/Gait Ambulation/Gait assistance: Min guard Gait Distance (Feet): 50 Feet Assistive device: Rolling walker (2 wheeled);Straight cane Gait Pattern/deviations: Decreased step length - right;Decreased step length - left;Decreased stride length Gait velocity: decreased   General Gait Details: unsteady with SPC while ambulating, balance improves with use of RW, limited by fatigue, O2 sat 95-96% while on 4L O2  Stairs            Wheelchair Mobility    Modified Rankin (Stroke Patients Only)       Balance Overall balance assessment: Needs assistance Sitting-balance support: Feet supported Sitting balance-Leahy Scale: Good Sitting balance - Comments: seated in chair   Standing balance support: Single extremity supported;During functional activity Standing balance-Leahy Scale: Fair Standing balance comment: fair/poor with SPC, improves with RW during ambulation                             Pertinent Vitals/Pain Pain Assessment: Faces Pain Score: 7  Faces Pain Scale: Hurts a little bit Pain Location: generalized Pain Intervention(s): Limited activity within patient's tolerance;Monitored during session;Repositioned    Home Living Family/patient expects to be discharged to:: Private residence Living Arrangements: Other relatives (grandson) Available Help at Discharge: Family;Available PRN/intermittently Type of Home: House Home Access: Stairs to enter Entrance Stairs-Rails: None Entrance Stairs-Number of Steps: 3 Home Layout:  One level Home Equipment: Cane - single point;Bedside commode (DME in storage from mother) Additional Comments: Daughter was arrested two days ago. She has been in and out of home and not reliable    Prior Function Level of Independence: Independent with assistive  device(s);Needs assistance   Gait / Transfers Assistance Needed: household ambulator with [facility]     Comments: Uses SPC for functional mobility      Hand Dominance   Dominant Hand: Right    Extremity/Trunk Assessment   Upper Extremity Assessment Upper Extremity Assessment: Defer to OT evaluation    Lower Extremity Assessment Lower Extremity Assessment: Generalized weakness       Communication   Communication: No difficulties  Cognition Arousal/Alertness: Awake/alert Behavior During Therapy: WFL for tasks assessed/performed Overall Cognitive Status: Within Functional Limits for tasks assessed                                        General Comments      Exercises     Assessment/Plan    PT Assessment Patient needs continued PT services  PT Problem List Decreased strength;Decreased mobility;Decreased balance;Decreased activity tolerance;Decreased knowledge of use of DME       PT Treatment Interventions DME instruction;Therapeutic exercise;Gait training;Balance training;Stair training;Neuromuscular re-education;Functional mobility training;Patient/family education;Therapeutic activities    PT Goals (Current goals can be found in the Care Plan section)  Acute Rehab PT Goals Patient Stated Goal: return home Time For Goal Achievement: 03/19/20 Potential to Achieve Goals: Fair    Frequency Min 3X/week   Barriers to discharge        Co-evaluation               AM-PAC PT "6 Clicks" Mobility  Outcome Measure Help needed turning from your back to your side while in a flat bed without using bedrails?: None Help needed moving from lying on your back to sitting on the side of a flat bed without using bedrails?: None Help needed moving to and from a bed to a chair (including a wheelchair)?: A Little Help needed standing up from a chair using your arms (e.g., wheelchair or bedside chair)?: None Help needed to walk in hospital room?: A Little Help  needed climbing 3-5 steps with a railing? : A Lot 6 Click Score: 20    End of Session Equipment Utilized During Treatment: Gait belt;Oxygen Activity Tolerance: Patient tolerated treatment well;Patient limited by fatigue Patient left: in chair;with call bell/phone within reach Nurse Communication: Mobility status PT Visit Diagnosis: Unsteadiness on feet (R26.81);Other abnormalities of gait and mobility (R26.89);Muscle weakness (generalized) (M62.81)    Time: 8786-7672 PT Time Calculation (min) (ACUTE ONLY): 17 min   Charges:   PT Evaluation $PT Eval Low Complexity: 1 Low PT Treatments $Therapeutic Activity: 8-22 mins        10:58 AM, 03/12/20 Mearl Latin PT, DPT Physical Therapist at [facility]

## 2020-03-15 NOTE — Telephone Encounter (Signed)
Patient has been scheduled for a televisit with Beth on 6/28 at 130.   Nothing further needed at time of call.

## 2020-03-17 DIAGNOSIS — Z681 Body mass index (BMI) 19 or less, adult: Secondary | ICD-10-CM | POA: Diagnosis not present

## 2020-03-17 DIAGNOSIS — Z1389 Encounter for screening for other disorder: Secondary | ICD-10-CM | POA: Diagnosis not present

## 2020-03-17 DIAGNOSIS — M810 Age-related osteoporosis without current pathological fracture: Secondary | ICD-10-CM | POA: Diagnosis not present

## 2020-03-17 DIAGNOSIS — J441 Chronic obstructive pulmonary disease with (acute) exacerbation: Secondary | ICD-10-CM | POA: Diagnosis not present

## 2020-03-17 DIAGNOSIS — J449 Chronic obstructive pulmonary disease, unspecified: Secondary | ICD-10-CM | POA: Diagnosis not present

## 2020-03-29 ENCOUNTER — Encounter: Payer: Medicare HMO | Admitting: Primary Care

## 2020-03-29 ENCOUNTER — Telehealth: Payer: Self-pay

## 2020-03-29 ENCOUNTER — Other Ambulatory Visit: Payer: Self-pay

## 2020-03-29 NOTE — Progress Notes (Signed)
Attempted to contact patient twice for televisit. LVM stating for patient to call office back by 1:45pm and if not patient would have to reschedule.

## 2020-04-02 DIAGNOSIS — Z79899 Other long term (current) drug therapy: Secondary | ICD-10-CM | POA: Diagnosis not present

## 2020-04-02 DIAGNOSIS — G894 Chronic pain syndrome: Secondary | ICD-10-CM | POA: Diagnosis not present

## 2020-04-02 DIAGNOSIS — R69 Illness, unspecified: Secondary | ICD-10-CM | POA: Diagnosis not present

## 2020-04-02 DIAGNOSIS — M1990 Unspecified osteoarthritis, unspecified site: Secondary | ICD-10-CM | POA: Diagnosis not present

## 2020-04-04 DIAGNOSIS — J449 Chronic obstructive pulmonary disease, unspecified: Secondary | ICD-10-CM | POA: Diagnosis not present

## 2020-04-07 ENCOUNTER — Other Ambulatory Visit: Payer: Self-pay

## 2020-04-07 ENCOUNTER — Ambulatory Visit (INDEPENDENT_AMBULATORY_CARE_PROVIDER_SITE_OTHER): Payer: Medicare HMO | Admitting: Acute Care

## 2020-04-07 DIAGNOSIS — R5381 Other malaise: Secondary | ICD-10-CM

## 2020-04-07 DIAGNOSIS — R64 Cachexia: Secondary | ICD-10-CM | POA: Diagnosis not present

## 2020-04-07 DIAGNOSIS — Z720 Tobacco use: Secondary | ICD-10-CM | POA: Diagnosis not present

## 2020-04-07 DIAGNOSIS — J449 Chronic obstructive pulmonary disease, unspecified: Secondary | ICD-10-CM

## 2020-04-07 DIAGNOSIS — J441 Chronic obstructive pulmonary disease with (acute) exacerbation: Secondary | ICD-10-CM

## 2020-04-07 MED ORDER — PREDNISONE 10 MG PO TABS: ORAL_TABLET | ORAL | 0 refills | Status: DC

## 2020-04-07 MED ORDER — DOXYCYCLINE HYCLATE 100 MG PO TABS: 100.0000 mg | ORAL_TABLET | Freq: Two times a day (BID) | ORAL | 0 refills | Status: DC

## 2020-04-07 NOTE — Patient Instructions (Addendum)
It is good to talk with your today. We will place orders for PFT's  Someone will call you to get these scheduled  Please schedule at [facility] if at all possible. Needs 6 minute walk scheduled prior to next appointment in [place]. We will schedule with first MD available  at [facility] office at earliest open visit.  Someone will call you with time and date.  Continue using your Trelegy one puff once daily Rinse mouth after use Make sure you are taking your nebulizer treatments as scheduled.  Remember you need to use your Atrovent four times daily Rescue inhaler / neds as needed for breakthrough shortness of breath or wheezing.  Continue your Flutter valve and Mucinex. ( 4 blows twice daily)  We will send in a prescription for Doxycycline 100 mg twice daily x 7 days Use Sunblock if you are outside . Swallow with a full glass of water. Prednisone taper; 10 mg tablets: 4 tabs x 2 days, 3 tabs x 2 days, 2 tabs x 2 days 1 tab x 2 days then stop. Follow up in 3 weeks with any MD at [facility] office.   Someone will call you with an appointment. Please contact office for sooner follow up if symptoms do not improve or worsen or seek emergency care
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
## 2020-04-09 ENCOUNTER — Encounter: Payer: Self-pay | Admitting: Acute Care

## 2020-04-09 NOTE — Telephone Encounter (Signed)
Error

## 2020-04-26 ENCOUNTER — Other Ambulatory Visit: Payer: Self-pay

## 2020-04-26 ENCOUNTER — Other Ambulatory Visit (HOSPITAL_COMMUNITY)
Admission: RE | Admit: 2020-04-26 | Discharge: 2020-04-26 | Disposition: A | Discharge status: Home / Self Care | Payer: Medicare HMO | Source: Ambulatory Visit | Attending: Acute Care | Admitting: Acute Care

## 2020-04-26 DIAGNOSIS — Z20822 Contact with and (suspected) exposure to covid-19: Secondary | ICD-10-CM | POA: Insufficient documentation

## 2020-04-26 DIAGNOSIS — Z01812 Encounter for preprocedural laboratory examination: Secondary | ICD-10-CM | POA: Insufficient documentation

## 2020-04-26 LAB — SARS CORONAVIRUS 2 (TAT 6-24 HRS): SARS Coronavirus 2: NEGATIVE

## 2020-04-28 ENCOUNTER — Ambulatory Visit (HOSPITAL_COMMUNITY)
Admission: RE | Admit: 2020-04-28 | Discharge: 2020-04-28 | Disposition: A | Discharge status: Home / Self Care | Payer: Medicare HMO | Source: Ambulatory Visit | Attending: Acute Care | Admitting: Acute Care

## 2020-04-28 ENCOUNTER — Other Ambulatory Visit: Payer: Self-pay

## 2020-04-28 DIAGNOSIS — J441 Chronic obstructive pulmonary disease with (acute) exacerbation: Secondary | ICD-10-CM | POA: Insufficient documentation

## 2020-04-28 MED ORDER — ALBUTEROL SULFATE (2.5 MG/3ML) 0.083% IN NEBU
2.5000 mg | INHALATION_SOLUTION | Freq: Once | RESPIRATORY_TRACT | Status: AC
  Administered 2020-04-28: 2.5 mg via RESPIRATORY_TRACT

## 2020-04-29 DIAGNOSIS — J329 Chronic sinusitis, unspecified: Secondary | ICD-10-CM | POA: Diagnosis not present

## 2020-04-29 DIAGNOSIS — Z681 Body mass index (BMI) 19 or less, adult: Secondary | ICD-10-CM | POA: Diagnosis not present

## 2020-04-29 DIAGNOSIS — J449 Chronic obstructive pulmonary disease, unspecified: Secondary | ICD-10-CM | POA: Diagnosis not present

## 2020-05-03 DIAGNOSIS — G894 Chronic pain syndrome: Secondary | ICD-10-CM | POA: Diagnosis not present

## 2020-05-03 DIAGNOSIS — Z79899 Other long term (current) drug therapy: Secondary | ICD-10-CM | POA: Diagnosis not present

## 2020-05-03 DIAGNOSIS — M1990 Unspecified osteoarthritis, unspecified site: Secondary | ICD-10-CM | POA: Diagnosis not present

## 2020-05-03 DIAGNOSIS — Z9189 Other specified personal risk factors, not elsewhere classified: Secondary | ICD-10-CM | POA: Diagnosis not present

## 2020-05-05 DIAGNOSIS — J449 Chronic obstructive pulmonary disease, unspecified: Secondary | ICD-10-CM | POA: Diagnosis not present

## 2020-05-17 ENCOUNTER — Ambulatory Visit: Payer: Medicare HMO | Admitting: Pulmonary Disease

## 2020-05-17 ENCOUNTER — Telehealth: Payer: Self-pay | Admitting: Pulmonary Disease

## 2020-05-17 NOTE — Telephone Encounter (Signed)
Attempted to call patient, unable to leave a voicemail. Patient missed AM appointment today with Dr. Halford Chessman.

## 2020-05-18 NOTE — Telephone Encounter (Signed)
Called and spoke to pt. Pt slept through appt with VS on 8/16. There is one APP appointment available this week and pt is unable to come in at that time. Pt c/o yellow mucus, chest discomfort when coughing, wheezing, increase in SOB x 5-7 days - s/s not getting better nor worse. Pt denies f/c/s, covid contacts, vaccinated, swelling, orthopnea. Pt states she normally does not wear O2 during the day but does wear it at night, 4lpm. Pt checked spo2 while on the phone and her spo2 was 88% on RA. Pt placed 4lpm on while on the phone and spo2 increased to 92%, pt advised to continue to wear her O2 24/7. Pt requesting recs from Dr Halford Chessman.    Dr. Halford Chessman please advise. Thanks.

## 2020-05-18 NOTE — Telephone Encounter (Signed)
Send script for zpak.    Send script for prednisone 10 mg pill >> 3 pills daily for 2 days, 2 pills daily for 2 days, 1 pill daily for 2 days.  Reschedule for next available ROV in [place].

## 2020-05-18 NOTE — Telephone Encounter (Signed)
lmtcb for pt.  

## 2020-05-19 MED ORDER — AZITHROMYCIN 250 MG PO TABS
ORAL_TABLET | ORAL | 0 refills | Status: DC
Start: 2020-05-19 — End: 2020-06-18

## 2020-05-19 MED ORDER — PREDNISONE 10 MG PO TABS
ORAL_TABLET | ORAL | 0 refills | Status: DC
Start: 2020-05-19 — End: 2020-06-18

## 2020-05-19 NOTE — Telephone Encounter (Signed)
Shows very severe COPD with emphysema.

## 2020-05-19 NOTE — Telephone Encounter (Signed)
Called and spoke with patient letting her know that PFT showed very severe COPD with emphysema. She expressed understanding. Let her know that her prescriptions were sent into pharmacy so to go pick them up and let us know if she needs anything else. Nothing further needed at this time.

## 2020-05-19 NOTE — Telephone Encounter (Signed)
Called and spoke with patient to let her know that we are going to send in 2 different prescriptions and she verified pharmacy. She expressed understanding.   Patient is also asking about the results of her PFT that was done on 04/28/20. She is also already scheduled with you on 9/17.  Dr. Halford Chessman please advise

## 2020-05-20 LAB — PULMONARY FUNCTION TEST
DL/VA % pred: 43 %
DL/VA: 1.79 ml/min/mmHg/L
DLCO unc % pred: 25 %
DLCO unc: 5.11 ml/min/mmHg
FEF 25-75 Post: 0.34 L/sec
FEF 25-75 Pre: 0.23 L/sec
FEF2575-%Change-Post: 49 %
FEF2575-%Pred-Post: 17 %
FEF2575-%Pred-Pre: 11 %
FEV1-%Change-Post: 6 %
FEV1-%Pred-Post: 30 %
FEV1-%Pred-Pre: 28 %
FEV1-Post: 0.72 L
FEV1-Pre: 0.68 L
FEV1FVC-%Change-Post: -13 %
FEV1FVC-%Pred-Pre: 49 %
FEV6-%Change-Post: 17 %
FEV6-%Pred-Post: 59 %
FEV6-%Pred-Pre: 51 %
FEV6-Post: 1.78 L
FEV6-Pre: 1.52 L
FEV6FVC-%Change-Post: -4 %
FEV6FVC-%Pred-Post: 84 %
FEV6FVC-%Pred-Pre: 88 %
FVC-%Change-Post: 22 %
FVC-%Pred-Post: 70 %
FVC-%Pred-Pre: 57 %
FVC-Post: 2.21 L
FVC-Pre: 1.8 L
Post FEV1/FVC ratio: 33 %
Post FEV6/FVC ratio: 81 %
Pre FEV1/FVC ratio: 38 %
Pre FEV6/FVC Ratio: 85 %

## 2020-05-31 DIAGNOSIS — Z79899 Other long term (current) drug therapy: Secondary | ICD-10-CM | POA: Diagnosis not present

## 2020-05-31 DIAGNOSIS — M1990 Unspecified osteoarthritis, unspecified site: Secondary | ICD-10-CM | POA: Diagnosis not present

## 2020-05-31 DIAGNOSIS — G894 Chronic pain syndrome: Secondary | ICD-10-CM | POA: Diagnosis not present

## 2020-05-31 DIAGNOSIS — Z720 Tobacco use: Secondary | ICD-10-CM | POA: Diagnosis not present

## 2020-05-31 DIAGNOSIS — E559 Vitamin D deficiency, unspecified: Secondary | ICD-10-CM | POA: Diagnosis not present

## 2020-06-18 ENCOUNTER — Other Ambulatory Visit: Payer: Self-pay

## 2020-06-18 ENCOUNTER — Telehealth: Payer: Self-pay

## 2020-06-18 ENCOUNTER — Encounter: Payer: Self-pay | Admitting: Pulmonary Disease

## 2020-06-18 ENCOUNTER — Ambulatory Visit (INDEPENDENT_AMBULATORY_CARE_PROVIDER_SITE_OTHER): Payer: Medicare HMO | Admitting: Pulmonary Disease

## 2020-06-18 VITALS — BP 140/62 | HR 72 | Temp 97.0°F | Ht 65.0 in | Wt 117.6 lb

## 2020-06-18 DIAGNOSIS — J449 Chronic obstructive pulmonary disease, unspecified: Secondary | ICD-10-CM | POA: Diagnosis not present

## 2020-06-18 DIAGNOSIS — J441 Chronic obstructive pulmonary disease with (acute) exacerbation: Secondary | ICD-10-CM

## 2020-06-18 DIAGNOSIS — R5381 Other malaise: Secondary | ICD-10-CM

## 2020-06-18 DIAGNOSIS — Z720 Tobacco use: Secondary | ICD-10-CM | POA: Diagnosis not present

## 2020-06-18 DIAGNOSIS — R64 Cachexia: Secondary | ICD-10-CM

## 2020-06-18 MED ORDER — REVEFENACIN 175 MCG/3ML IN SOLN: 175.0000 ug | Freq: Every day | RESPIRATORY_TRACT | 5 refills | Status: DC

## 2020-06-18 MED ORDER — FORMOTEROL FUMARATE 20 MCG/2ML IN NEBU: 20.0000 ug | INHALATION_SOLUTION | Freq: Two times a day (BID) | RESPIRATORY_TRACT | 5 refills | Status: DC

## 2020-06-18 MED ORDER — AZITHROMYCIN 250 MG PO TABS
ORAL_TABLET | ORAL | 0 refills | Status: AC
Start: 2020-06-18 — End: 2020-06-23

## 2020-06-18 MED ORDER — BUDESONIDE 0.5 MG/2ML IN SUSP: 0.5000 mg | Freq: Two times a day (BID) | RESPIRATORY_TRACT | 5 refills | Status: DC

## 2020-06-18 MED ORDER — PREDNISONE 5 MG PO TABS
ORAL_TABLET | ORAL | 0 refills | Status: DC
Start: 2020-06-18 — End: 2020-07-15

## 2020-06-18 NOTE — Patient Instructions (Addendum)
Yupelri one vial nebulized daily Budesonide (pulmicort) one vial nebulized twice per day, and rinse mouth after each use Formoterol (perforomist) one vial nebulized twice per day  Stop using trelegy once you start using yupelri, budesonide, and formoterol  Prednisone 5 mg pill >> 4 pills daily for 3 days, 2 pills daily for 3 days, then 1 pill daily until next visit  Zithromax 250 mg pill >> 2 pills on day 1, then 1 pill daily for next 4 days  Follow up in 4 weeks

## 2020-06-18 NOTE — Telephone Encounter (Signed)
Spoke with Joycelyn Schmid, representative for [employer]. Faxed patient demographics, insurance info, office note and signed paper prescriptions for Yupelri neb, Perforomist neb &Pulmicort neb to direct Rx per Dr Halford Chessman. Patient aware and expressed understanding. Nothing further needed at this time

## 2020-06-18 NOTE — Progress Notes (Signed)
[facility]  Chief Complaint  Patient presents with   Follow-up    shortness of breath, productive cough with yellow-white phlegm    Constitutional:  BP 140/62 (BP Location: Left Arm, Cuff Size: Normal)    Pulse 72    Temp (!) 97 F (36.1 C) (Other (Comment)) Comment (Src): wrist   Ht 5\' 5"  (1.651 m)    Wt 117 lb 9.6 oz (53.3 kg)    SpO2 94% Comment: Room air   BMI 19.57 kg/m   Past Medical History:    Past Surgical History:  Her  has a past surgical history that includes Esophagogastroduodenoscopy (07/23/09); Partial hysterectomy; Colonoscopy (2007); Abdominal hysterectomy; Cataract extraction w/PHACO (Left, 01/26/2015); Cataract extraction w/PHACO (Right, 02/23/2015); Hip pinning, cannulated (Left, 07/15/2017); Esophagogastroduodenoscopy (2013); Colonoscopy with propofol (N/A, 03/25/2018); polypectomy (03/25/2018); and Microlaryngoscopy (N/A, 07/08/2019).  Brief Summary:  Amy Rubio is a 69 y.o. 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      Subjective:   PFT showed severe obstruction, severe diffusion defect, and bronchodilator response.  She continues to smoke 1/2 to 1 ppd.    She is having cough with clear to yellow sputum.  Has chest tightness and wheezing.  Not having fever or hemoptysis.  Not using her oxygen consistently.  Will use at night, but only when she gets tired during the day.  She gets fatigued easily and doesn't like doing activities.  Physical Exam:   Appearance - thin  ENMT - no sinus tenderness, no oral exudate, no LAN, Mallampati 2 airway, no stridor  Respiratory - b/l expiratory wheezing  CV - s1s2 regular rate and rhythm, no murmurs  Ext - no clubbing, no edema  Skin - no rashes  Psych - normal mood and affect   Pulmonary testing:   PFT 04/28/20 >> FEV1 0.72 (30%), FEV15 33, DLCO 25%, +BD  Chest Imaging:    CT chest 04/28/16 >> moderate centrilobular emphysema, mild BTX  Social History:  She  reports that she has been smoking cigarettes. She has a 22.50 pack-year smoking history. She has never used smokeless tobacco. She reports that she does not drink alcohol and does not use drugs.  Family History:  Her family history includes Colon cancer in her father; Hypertension in her father; Lung cancer in her sister.     Assessment/Plan:   COPD exacerbation. COPD with emphysema and chronic bronchitis. - will give her course of zithromax - will give her course of prednisone, but have her stay on 5 mg daily until her next visit - I am not sure she can effectively use trelegy - will try transitioning her to yupelri, perforomist, and pulmicort in nebulizer - prn albuterol - might need to consider adding daliresp  Chronic respiratory failure with hypoxia. - goal SpO2 > 90% - she is using 4 liters oxygen with exertion and sleep  Tobacco abuse. 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 - encouraged her to keep up with her caloric and protein intake   Time Spent Involved in Patient Care on Day of Examination:  36 minutes  Follow up:  Patient Instructions  Yupelri one vial nebulized daily Budesonide (pulmicort) one vial nebulized twice per day, and rinse mouth after each use Formoterol (perforomist) one vial nebulized twice per day  Stop using trelegy once you start using yupelri, budesonide, and formoterol  Prednisone 5 mg pill >>  4 pills daily for 3 days, 2 pills daily for 3 days, then 1 pill daily until next visit  Zithromax 250 mg pill >> 2 pills on day 1, then 1 pill daily for next 4 days  Follow up in 4 weeks   Medication List:   Allergies as of 06/18/2020   No Known Allergies     Medication List       Accurate as of June 18, 2020 12:37 PM. If you have any questions, ask your nurse or doctor.        STOP taking these medications   ipratropium 0.02  % nebulizer solution Commonly known as: ATROVENT Stopped by: Chesley Mires, MD   Trelegy Ellipta 100-62.5-25 MCG/INH Aepb Generic drug: Fluticasone-Umeclidin-Vilant Stopped by: Chesley Mires, MD     TAKE these medications   albuterol 108 (90 Base) MCG/ACT inhaler Commonly known as: Ventolin HFA INHALE 2 PUFFS INTO THE LUNGS EVERY 4 HOURS AS NEEDED FOR WHEEZING ORSHORTNESS OF BREATH. What changed:   how much to take  how to take this  when to take this  reasons to take this   albuterol (2.5 MG/3ML) 0.083% nebulizer solution Commonly known as: PROVENTIL Take 3 mLs (2.5 mg total) by nebulization every 4 (four) hours as needed for wheezing or shortness of breath. What changed: when to take this   ALPRAZolam 0.5 MG tablet Commonly known as: XANAX Take 1 tablet (0.5 mg total) by mouth every 6 (six) hours as needed for anxiety.   azithromycin 250 MG tablet Commonly known as: ZITHROMAX Take 2 tablets (500 mg total) by mouth daily for 1 day, THEN 1 tablet (250 mg total) daily for 4 days. Start taking on: June 18, 2020 What changed: See the new instructions. Changed by: Chesley Mires, MD   budesonide 0.5 MG/2ML nebulizer solution Commonly known as: PULMICORT Take 2 mLs (0.5 mg total) by nebulization in the morning and at bedtime. Started by: Chesley Mires, MD   feeding supplement (ENSURE ENLIVE) Liqd Take 237 mLs by mouth 2 (two) times daily between meals.   FLUoxetine 40 MG capsule Commonly known as: PROZAC Take 40 mg by mouth daily.   formoterol 20 MCG/2ML nebulizer solution Commonly known as: PERFOROMIST Take 2 mLs (20 mcg total) by nebulization 2 (two) times daily. Started by: Chesley Mires, MD   guaiFENesin 600 MG 12 hr tablet Commonly known as: MUCINEX Take 1 tablet (600 mg total) by mouth 2 (two) times daily. What changed: when to take this   lisinopril-hydrochlorothiazide 10-12.5 MG tablet Commonly known as: ZESTORETIC Take 1 tablet by mouth daily.     multivitamin with minerals Tabs tablet Take 1 tablet by mouth daily.   omeprazole 20 MG capsule Commonly known as: PRILOSEC Take 1 capsule (20 mg total) by mouth daily. What changed:   when to take this  reasons to take this   oxyCODONE-acetaminophen 7.5-325 MG tablet Commonly known as: PERCOCET Take 1 tablet by mouth 4 (four) times daily as needed.   predniSONE 5 MG tablet Commonly known as: DELTASONE Take 4 tablets (20 mg total) by mouth daily with breakfast for 3 days, THEN 2 tablets (10 mg total) daily with breakfast for 3 days, THEN 1 tablet (5 mg total) daily with breakfast for 22 days. Start taking on: June 18, 2020 What changed:   medication strength  See the new instructions. Changed by: Chesley Mires, MD   pregabalin 200 MG capsule Commonly known as: LYRICA Take 200 mg by mouth 2 (two) times daily.  QUEtiapine 50 MG tablet Commonly known as: SEROQUEL Take 50 mg by mouth daily.   revefenacin 175 MCG/3ML nebulizer solution Commonly known as: YUPELRI Take 3 mLs (175 mcg total) by nebulization daily. Started by: Chesley Mires, MD   traZODone 50 MG tablet Commonly known as: DESYREL Take 1 tablet (50 mg total) by mouth at bedtime. What changed:   when to take this  reasons to take this       Signature:  Chesley Mires, MD [place] Pager - [phone] 06/18/2020, 12:37 PM

## 2020-06-21 ENCOUNTER — Telehealth: Payer: Self-pay | Admitting: Pulmonary Disease

## 2020-06-22 MED ORDER — FORMOTEROL FUMARATE 20 MCG/2ML IN NEBU: 20.0000 ug | INHALATION_SOLUTION | Freq: Two times a day (BID) | RESPIRATORY_TRACT | 5 refills | Status: DC

## 2020-06-22 MED ORDER — BUDESONIDE 0.5 MG/2ML IN SUSP: 0.5000 mg | Freq: Two times a day (BID) | RESPIRATORY_TRACT | 5 refills | Status: DC

## 2020-06-22 NOTE — Telephone Encounter (Signed)
Spoke with Joycelyn Schmid with [facility] regarding nebulizer clarification.  Advised that patient had received her performist and pulmicort from [place]. Advised they would need to contact the patient prior to ordering any nebs for her.  Joycelyn Schmid advised they always call prior to ordering supplies for patients.  Nothing further needed.

## 2020-06-22 NOTE — Telephone Encounter (Signed)
lmtcb for [facility].   Called and spoke to pt. Pt states she already received her neb meds from [place]. She got 5 boxes each (Perforomist and Pulmicort) with 25 doses in each box. Pt state she doesn't remember wanting to have [facility] fill any meds.   Will need to speak with Joycelyn Schmid about the reason we would send the neb scripts to [facility]. Pt seems content with [facility].

## 2020-06-25 ENCOUNTER — Telehealth: Payer: Self-pay | Admitting: Pulmonary Disease

## 2020-06-28 NOTE — Telephone Encounter (Signed)
Called [facility], LM for Claiborne Billings to call back tomorrow for Dr. Juanetta Gosling recommendations.

## 2020-06-28 NOTE — Telephone Encounter (Signed)
Dr. Halford Chessman, can you clarify if the nebs should be 60 mls or 60 vials?  Thank you.

## 2020-06-28 NOTE — Telephone Encounter (Signed)
Okay to change to 60 vials.

## 2020-06-30 MED ORDER — BUDESONIDE 0.5 MG/2ML IN SUSP: 2.0000 mL | Freq: Two times a day (BID) | RESPIRATORY_TRACT | 5 refills | Status: AC

## 2020-06-30 MED ORDER — FORMOTEROL FUMARATE 20 MCG/2ML IN NEBU: 2.0000 mL | INHALATION_SOLUTION | Freq: Two times a day (BID) | RESPIRATORY_TRACT | 5 refills | Status: AC

## 2020-06-30 NOTE — Telephone Encounter (Signed)
Called [facility] and spoke with Amy Rubio letting her know that VS said it was okay to change to 60vials and she verbalized understanding. Nothing further needed.

## 2020-07-03 IMAGING — DX DG CHEST 1V PORT
1 series · 1 of 1 positions shown · non-contrast
Comparison: Radiograph 09/29/2019, chest CT 04/28/2016

CLINICAL DATA: Shortness of breath and wheezing for 3 days.

EXAM:
PORTABLE CHEST 1 VIEW

[chest ap]
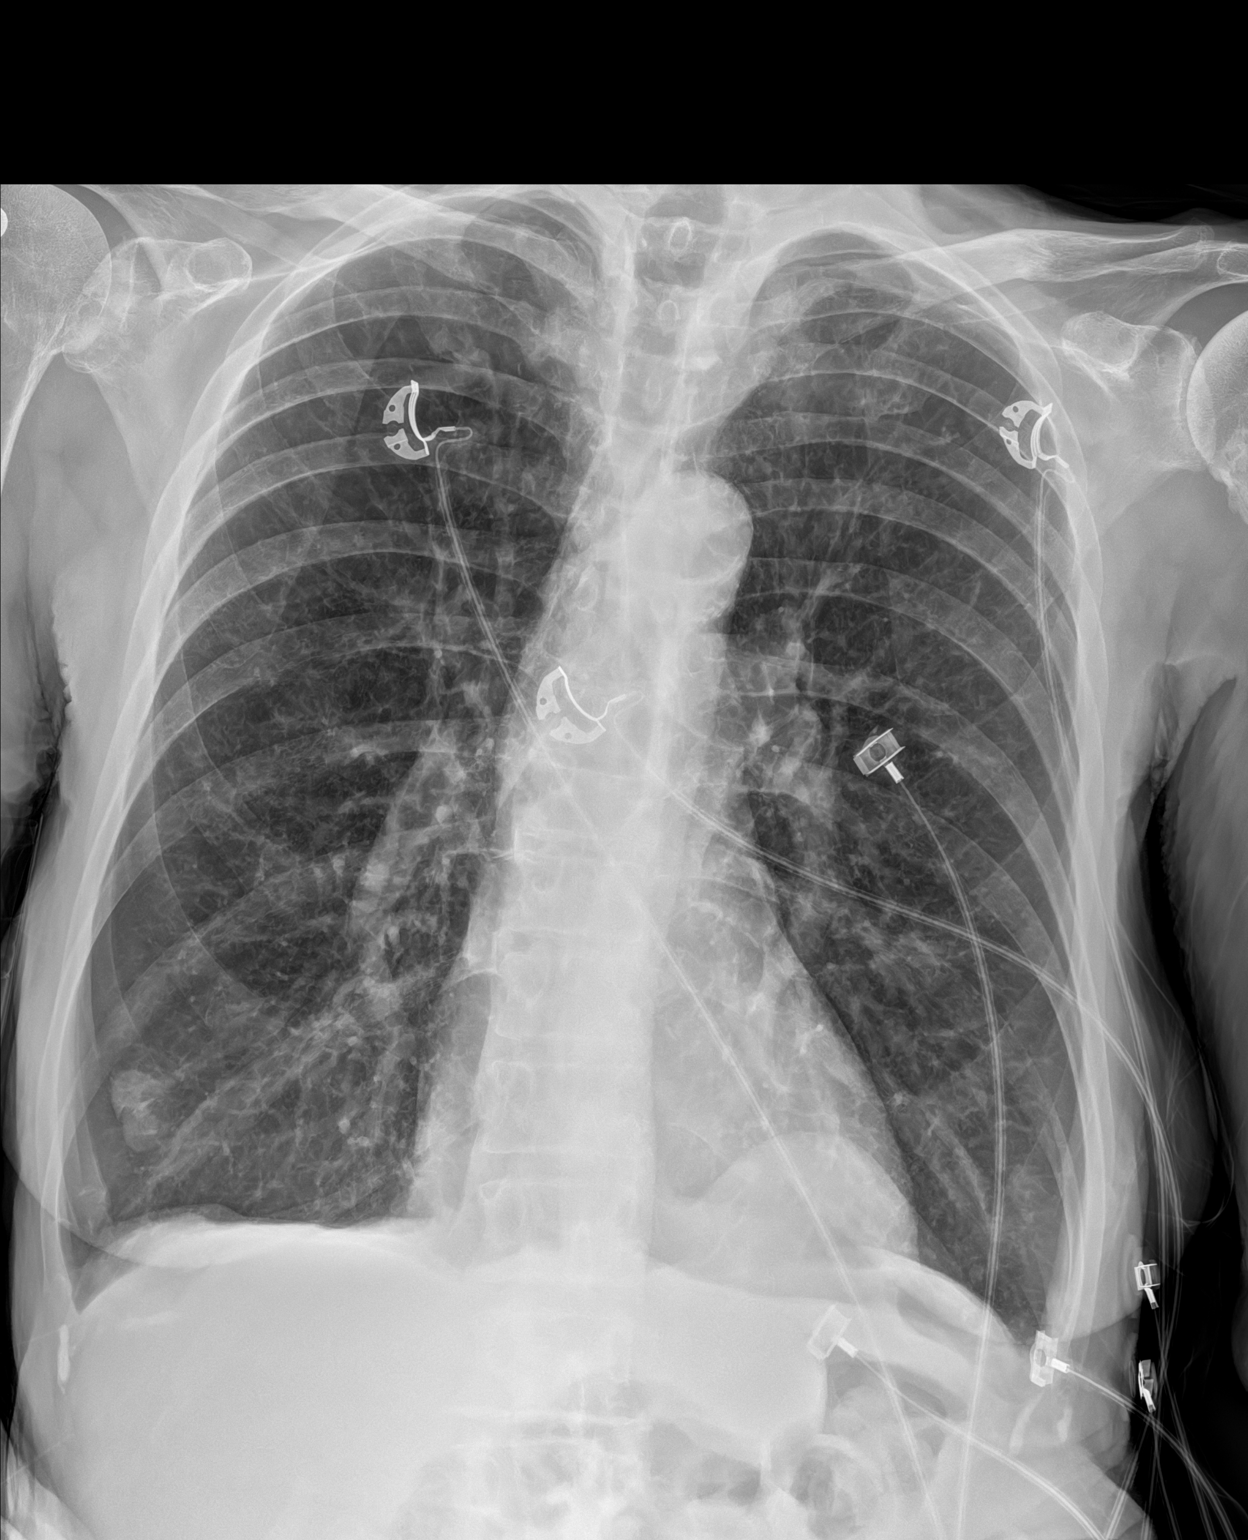

[1 of 1 positions shown; findings below may reference images not displayed]

FINDINGS: Chronic hyperinflation and bronchial thickening. Normal heart size
and mediastinal contours. Aortic atherosclerosis. Left basilar
Bochdalek hernia. Presumed nipple shadows projecting over the lung
bases. No focal airspace disease, pulmonary edema, pleural effusion
or pneumothorax. No acute osseous abnormalities are seen.
IMPRESSION: 1. Chronic hyperinflation and bronchial thickening, consistent with
COPD. No acute abnormality.
2. Presumed nipple shadows projecting over the lung bases, also seen
on prior exam.

Aortic Atherosclerosis (1MPXM-RJC.C).

## 2020-07-15 ENCOUNTER — Encounter: Payer: Self-pay | Admitting: Pulmonary Disease

## 2020-07-15 ENCOUNTER — Other Ambulatory Visit: Payer: Self-pay

## 2020-07-15 ENCOUNTER — Ambulatory Visit (INDEPENDENT_AMBULATORY_CARE_PROVIDER_SITE_OTHER): Payer: Medicare Other | Admitting: Pulmonary Disease

## 2020-07-15 VITALS — BP 130/58 | HR 87 | Temp 97.7°F | Ht 65.0 in | Wt 111.0 lb

## 2020-07-15 DIAGNOSIS — Z720 Tobacco use: Secondary | ICD-10-CM

## 2020-07-15 DIAGNOSIS — J449 Chronic obstructive pulmonary disease, unspecified: Secondary | ICD-10-CM

## 2020-07-15 DIAGNOSIS — Z23 Encounter for immunization: Secondary | ICD-10-CM | POA: Diagnosis not present

## 2020-07-15 DIAGNOSIS — J9611 Chronic respiratory failure with hypoxia: Secondary | ICD-10-CM

## 2020-07-15 DIAGNOSIS — J432 Centrilobular emphysema: Secondary | ICD-10-CM | POA: Diagnosis not present

## 2020-07-15 DIAGNOSIS — R64 Cachexia: Secondary | ICD-10-CM

## 2020-07-15 NOTE — Patient Instructions (Signed)
High dose flu shot today  Check which nebulizer medicines you have at home and call our office to update records  Follow up in 4 months

## 2020-07-15 NOTE — Progress Notes (Signed)
[facility]  Chief Complaint  Patient presents with   Follow-up    shortness of breath with activity    Constitutional:  BP (!) 130/58 (BP Location: Left Arm, Cuff Size: Normal)    Pulse 87    Temp 97.7 F (36.5 C) (Other (Comment)) Comment (Src): wrist   Ht 5\' 5"  (1.651 m)    Wt 111 lb (50.3 kg)    SpO2 95% Comment: Room air   BMI 18.47 kg/m   Past Medical History:  Colon polyp, Anxiety, OA, Chronic pain, Depression, GERD, Esophageal stricture, HA, HTN, Osteoporosis, Polio  Past Surgical History:  Her  has a past surgical history that includes Esophagogastroduodenoscopy (07/23/09); Partial hysterectomy; Colonoscopy (2007); Abdominal hysterectomy; Cataract extraction w/PHACO (Left, 01/26/2015); Cataract extraction w/PHACO (Right, 02/23/2015); Hip pinning, cannulated (Left, 07/15/2017); Esophagogastroduodenoscopy (2013); Colonoscopy with propofol (N/A, 03/25/2018); polypectomy (03/25/2018); and Microlaryngoscopy (N/A, 07/08/2019).  Brief Summary:  Amy Rubio is a 69 y.o. 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      Subjective:   She finished prednisone about 3 days ago.  She was able to get some of the nebulizer medication, but some of it was too expensive.  She isn't sure what she has at home.  She is still smoking 10 to 15 cigarettes per day. She has a hard time keeping up with her grandchildren.  She has cough and clear sputum.  Intermittent episodes of wheezing.   Physical Exam:   Appearance - well kempt,thin  ENMT - no sinus tenderness, no oral exudate, no LAN, Mallampati 2 airway, no stridor  Respiratory - decreased breath sounds bilaterally, no wheezing or rales  CV - s1s2 regular rate and rhythm, no murmurs  Ext - no clubbing, no edema  Skin - no rashes  Psych - normal mood and affect    Pulmonary testing:   PFT 04/28/20 >> FEV1  0.72 (30%), FEV15 33, DLCO 25%, +BD  Chest Imaging:   CT chest 04/28/16 >> moderate centrilobular emphysema, mild BTX  Social History:  She  reports that she has been smoking cigarettes. She has a 22.50 pack-year smoking history. She has never used smokeless tobacco. She reports that she does not drink alcohol and does not use drugs.  Family History:  Her family history includes Colon cancer in her father; Hypertension in her father; Lung cancer in her sister.     Assessment/Plan:   COPD with emphysema and chronic bronchitis. - she will call with list of nebulizer medications she is using and will then let her know if adjustments need to be made - defer additional prednisone for now - if she has recurrent exacerbations, then consider adding daliresp - high dose influenza vaccine today  Chronic respiratory failure with hypoxia. - goal SpO2 > 90% - 4 liters oxygen with exertion and sleep  Tobacco abuse. 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 - discussed options to help with caloric and protein intake   Time Spent Involved in Patient Care on Day of Examination:  25 minutes  Follow up:  Patient Instructions  High dose flu shot today  Check which nebulizer medicines you have at home and call our office to update records  Follow up in 4 months  Medication List:   Allergies as of 07/15/2020   No Known Allergies     Medication List       Accurate as of July 15, 2020  1:10 PM. If you have any questions, ask your nurse or doctor.        STOP taking these medications   predniSONE 5 MG tablet Commonly known as: DELTASONE Stopped by: Chesley Mires, MD     TAKE these medications   albuterol 108 (90 Base) MCG/ACT inhaler Commonly known as: Ventolin HFA INHALE 2 PUFFS INTO THE LUNGS EVERY 4 HOURS  AS NEEDED FOR WHEEZING ORSHORTNESS OF BREATH. What changed:   how much to take  how to take this  when to take this  reasons to take this   albuterol (2.5 MG/3ML) 0.083% nebulizer solution Commonly known as: PROVENTIL Take 3 mLs (2.5 mg total) by nebulization every 4 (four) hours as needed for wheezing or shortness of breath. What changed: when to take this   ALPRAZolam 0.5 MG tablet Commonly known as: XANAX Take 1 tablet (0.5 mg total) by mouth every 6 (six) hours as needed for anxiety.   budesonide 0.5 MG/2ML nebulizer solution Commonly known as: PULMICORT Take 2 mLs (0.5 mg total) by nebulization in the morning and at bedtime.   feeding supplement Liqd Take 237 mLs by mouth 2 (two) times daily between meals.   FLUoxetine 40 MG capsule Commonly known as: PROZAC Take 40 mg by mouth daily.   formoterol 20 MCG/2ML nebulizer solution Commonly known as: PERFOROMIST Take 2 mLs (20 mcg total) by nebulization 2 (two) times daily.   guaiFENesin 600 MG 12 hr tablet Commonly known as: MUCINEX Take 1 tablet (600 mg total) by mouth 2 (two) times daily. What changed: when to take this   lisinopril-hydrochlorothiazide 10-12.5 MG tablet Commonly known as: ZESTORETIC Take 1 tablet by mouth daily.   multivitamin with minerals Tabs tablet Take 1 tablet by mouth daily.   omeprazole 20 MG capsule Commonly known as: PRILOSEC Take 1 capsule (20 mg total) by mouth daily. What changed:   when to take this  reasons to take this   oxyCODONE-acetaminophen 7.5-325 MG tablet Commonly known as: PERCOCET Take 1 tablet by mouth 4 (four) times daily as needed.   pregabalin 200 MG capsule Commonly known as: LYRICA Take 200 mg by mouth 2 (two) times daily.   QUEtiapine 50 MG tablet Commonly known as: SEROQUEL Take 50 mg by mouth daily.   revefenacin 175 MCG/3ML nebulizer solution Commonly known as: YUPELRI Take 3 mLs (175 mcg total) by nebulization daily.   traZODone 50 MG  tablet Commonly known as: DESYREL Take 1 tablet (50 mg total) by mouth at bedtime. What changed:   when to take this  reasons to take this       Signature:  Chesley Mires, MD [place] Pager - [phone] 07/15/2020, 1:10 PM

## 2020-07-21 ENCOUNTER — Telehealth: Payer: Self-pay | Admitting: Pulmonary Disease

## 2020-07-21 NOTE — Telephone Encounter (Signed)
Noted.  Chesley Mires, MD [place] Pager - [phone] 07/21/2020, 9:57 PM

## 2020-07-21 NOTE — Telephone Encounter (Signed)
Spoke with pt. States that she is taking Pulmicort, Perforomist and albuterol nebs. She can't afford the copay on Yupelri. Pt wanted Dr. Halford Chessman to know which meds she is using. Maretta Bees has been removed from her medication list.  Dr. Halford Chessman - please advise. Thanks!

## 2020-07-23 NOTE — Telephone Encounter (Signed)
Will close encounter

## 2020-08-05 DIAGNOSIS — J449 Chronic obstructive pulmonary disease, unspecified: Secondary | ICD-10-CM | POA: Diagnosis not present

## 2020-08-23 DIAGNOSIS — G894 Chronic pain syndrome: Secondary | ICD-10-CM | POA: Diagnosis not present

## 2020-09-04 DIAGNOSIS — J449 Chronic obstructive pulmonary disease, unspecified: Secondary | ICD-10-CM | POA: Diagnosis not present

## 2020-09-23 DIAGNOSIS — J441 Chronic obstructive pulmonary disease with (acute) exacerbation: Secondary | ICD-10-CM | POA: Diagnosis not present

## 2020-09-23 DIAGNOSIS — J439 Emphysema, unspecified: Secondary | ICD-10-CM | POA: Diagnosis not present

## 2020-09-23 DIAGNOSIS — J9611 Chronic respiratory failure with hypoxia: Secondary | ICD-10-CM | POA: Diagnosis not present

## 2020-09-23 DIAGNOSIS — J449 Chronic obstructive pulmonary disease, unspecified: Secondary | ICD-10-CM | POA: Diagnosis not present

## 2020-09-23 DIAGNOSIS — G894 Chronic pain syndrome: Secondary | ICD-10-CM | POA: Diagnosis not present

## 2020-09-23 DIAGNOSIS — Z681 Body mass index (BMI) 19 or less, adult: Secondary | ICD-10-CM | POA: Diagnosis not present

## 2020-09-24 ENCOUNTER — Emergency Department (HOSPITAL_COMMUNITY): Payer: Medicare Other

## 2020-09-24 ENCOUNTER — Emergency Department (HOSPITAL_COMMUNITY)
Admission: EM | Admit: 2020-09-24 | Discharge: 2020-09-25 | Disposition: A | Discharge status: Home / Self Care | Payer: Medicare Other | Attending: Emergency Medicine | Admitting: Emergency Medicine

## 2020-09-24 ENCOUNTER — Encounter (HOSPITAL_COMMUNITY): Payer: Self-pay

## 2020-09-24 ENCOUNTER — Other Ambulatory Visit: Payer: Self-pay

## 2020-09-24 DIAGNOSIS — W01198A Fall on same level from slipping, tripping and stumbling with subsequent striking against other object, initial encounter: Secondary | ICD-10-CM | POA: Insufficient documentation

## 2020-09-24 DIAGNOSIS — R0902 Hypoxemia: Secondary | ICD-10-CM | POA: Diagnosis not present

## 2020-09-24 DIAGNOSIS — Z7951 Long term (current) use of inhaled steroids: Secondary | ICD-10-CM | POA: Diagnosis not present

## 2020-09-24 DIAGNOSIS — Z86010 Personal history of colonic polyps: Secondary | ICD-10-CM | POA: Diagnosis not present

## 2020-09-24 DIAGNOSIS — S6992XA Unspecified injury of left wrist, hand and finger(s), initial encounter: Secondary | ICD-10-CM | POA: Diagnosis not present

## 2020-09-24 DIAGNOSIS — I10 Essential (primary) hypertension: Secondary | ICD-10-CM | POA: Insufficient documentation

## 2020-09-24 DIAGNOSIS — Z79899 Other long term (current) drug therapy: Secondary | ICD-10-CM | POA: Diagnosis not present

## 2020-09-24 DIAGNOSIS — F17210 Nicotine dependence, cigarettes, uncomplicated: Secondary | ICD-10-CM | POA: Insufficient documentation

## 2020-09-24 DIAGNOSIS — S4992XA Unspecified injury of left shoulder and upper arm, initial encounter: Secondary | ICD-10-CM | POA: Diagnosis present

## 2020-09-24 DIAGNOSIS — S42212A Unspecified displaced fracture of surgical neck of left humerus, initial encounter for closed fracture: Secondary | ICD-10-CM | POA: Diagnosis not present

## 2020-09-24 DIAGNOSIS — M79603 Pain in arm, unspecified: Secondary | ICD-10-CM | POA: Diagnosis not present

## 2020-09-24 DIAGNOSIS — S42292A Other displaced fracture of upper end of left humerus, initial encounter for closed fracture: Secondary | ICD-10-CM | POA: Diagnosis not present

## 2020-09-24 DIAGNOSIS — J441 Chronic obstructive pulmonary disease with (acute) exacerbation: Secondary | ICD-10-CM | POA: Insufficient documentation

## 2020-09-24 DIAGNOSIS — R52 Pain, unspecified: Secondary | ICD-10-CM | POA: Diagnosis not present

## 2020-09-24 DIAGNOSIS — M19012 Primary osteoarthritis, left shoulder: Secondary | ICD-10-CM | POA: Diagnosis not present

## 2020-09-24 DIAGNOSIS — M25519 Pain in unspecified shoulder: Secondary | ICD-10-CM | POA: Diagnosis not present

## 2020-09-24 DIAGNOSIS — W19XXXA Unspecified fall, initial encounter: Secondary | ICD-10-CM

## 2020-09-24 MED ORDER — HYDROCODONE-ACETAMINOPHEN 5-325 MG PO TABS
1.0000 | ORAL_TABLET | Freq: Once | ORAL | Status: AC
  Administered 2020-09-24: 22:00:00 1 via ORAL
  Filled 2020-09-24: qty 1

## 2020-09-24 NOTE — ED Notes (Signed)
Patient discharged home to grandaughter.  All discharge instructions reviewed.  Patient receptive and demonstrated knowledge via teachback method.  Wheelchair out of ED.

## 2020-09-24 NOTE — Discharge Instructions (Addendum)
Take your pain medication as previously prescribed. Follow-up with orthopedics next week. Wear your shoulder immobilizer.

## 2020-09-24 NOTE — ED Provider Notes (Signed)
[facility] EMERGENCY DEPARTMENT Provider Note   CSN: [id number] Arrival date & time: 09/24/20  2135     History Chief Complaint  Patient presents with  . Fall    With Left shoulder pain     Amy Rubio is a 69 y.o. female.  69 year old female brought in by EMS from home for left arm injury after a fall which occurred yesterday.  Patient states that she tripped over her oxygen tubing causing her to fall landing on her left arm.  Denies hitting her head or loss of consciousness, is not anticoagulated.  Patient reports pain in her left upper arm as well as her left wrist.  No other injuries, complaints, concerns.        Past Medical History:  Diagnosis Date  . Adenomatous colon polyp 2007   Due surveillance colonoscopy 2012  . Anxiety   . Arthritis   . Chronic pain   . COPD (chronic obstructive pulmonary disease) (HCC)    exacerbations requiring hospitalizations x 2 at [facility]  . Depression   . Dyspnea   . Esophageal stricture 07/2009  . Fibromyalgia   . GERD (gastroesophageal reflux disease)   . Headache(784.0)   . Heart murmur   . Hypertension   . Osteoporosis   . Polio    Right lower extremity weakness  . Post poliomyelitis syndrome     Patient Active Problem List   Diagnosis Date Noted  . Palliative care by specialist   . Goals of care, counseling/discussion 03/03/2020  . Acute on chronic respiratory failure with hypoxia ([place]) 03/03/2020  . Protein-calorie malnutrition, severe 09/30/2019  . Acute respiratory distress   . Pressure injury of skin 03/20/2019  . COPD (chronic obstructive pulmonary disease) ([place]) 12/19/2018  . COPD with acute exacerbation ([place]) 12/18/2018  . CAP (community acquired pneumonia) 12/18/2018  . Acute respiratory failure with hypoxia ([place]) 12/18/2018  . Constipation 01/31/2018  . S/P ORIF (open reduction internal fixation) fracture( cannulated hip pins ) 07/15/17 07/30/2017  . Hypokalemia 07/15/2017  . Hyponatremia  07/15/2017  . Closed left hip fracture, initial encounter ([place]) 07/15/2017  . Anxiety 07/15/2017  . Visual field defect 12/13/2015  . Senile purpura ([place]) 06/14/2015  . Hyperlipidemia 10/30/2013  . Osteoporosis 07/31/2013  . Essential hypertension, benign 07/31/2013  . Osteoarthritis 07/31/2013  . Chronic pain syndrome 04/24/2013  . Esophageal dysphagia 10/30/2011  . FH: colon cancer 10/30/2011  . Abnormal CT scan, esophagus 10/30/2011  . ESOPHAGEAL STRICTURE 08/24/2009  . COLONIC POLYPS, ADENOMATOUS, HX OF 08/24/2009  . CIGARETTE SMOKER 08/18/2009  . COPD exacerbation ([place]) 08/18/2009  . WEIGHT LOSS, RECENT 08/18/2009  . CHEST PAIN UNSPECIFIED 08/18/2009    Past Surgical History:  Procedure Laterality Date  . ABDOMINAL HYSTERECTOMY     PARTIAL  . CATARACT EXTRACTION W/PHACO Left 01/26/2015   Procedure: CATARACT EXTRACTION PHACO AND INTRAOCULAR LENS PLACEMENT (IOC);  Surgeon: Rutherford Guys, MD;  Location: [facility];  Service: Ophthalmology;  Laterality: Left;  CDE:7.72  . CATARACT EXTRACTION W/PHACO Right 02/23/2015   Procedure: CATARACT EXTRACTION PHACO AND INTRAOCULAR LENS PLACEMENT (IOC);  Surgeon: Rutherford Guys, MD;  Location: [facility];  Service: Ophthalmology;  Laterality: Right;  CDE:10.68  . COLONOSCOPY  2007   adenoma (1.3cm). multiple small polyps ablated, diverticulosis  . COLONOSCOPY WITH PROPOFOL N/A 03/25/2018   Procedure: COLONOSCOPY WITH PROPOFOL;  Surgeon: Daneil Dolin, MD;  Location: [facility] ENDO SU[facility];  Service: Endoscopy;  Laterality: N/A;  9:15am  . ESOPHAGOGASTRODUODENOSCOPY  07/23/09   distal  thickened GEJ, peptic stricture narrowed lumen to 66mm, small hh, moderate gastritis (no H.Pylori), mild duodenitis.   Marland Kitchen ESOPHAGOGASTRODUODENOSCOPY  2013   Dr. Gala Romney: Possible occult cervical esophageal web status post dilation, small hiatal hernia.  Marland Kitchen HIP PINNING,CANNULATED Left 07/15/2017   Procedure: CANNULATED HIP PINNING;  Surgeon: Carole Civil, MD;  Location: [facility] OR[facility];   Service: Orthopedics;  Laterality: Left;  Marland Kitchen MICROLARYNGOSCOPY N/A 07/08/2019   Procedure: MICRO DIRECT LARYNGOSOCPY WITH EXCISION VOCAL CORD MASS;  Surgeon: Leta Baptist, MD;  Location: [place];  Service: ENT;  Laterality: N/A;  . PARTIAL HYSTERECTOMY    . POLYPECTOMY  03/25/2018   Procedure: POLYPECTOMY;  Surgeon: Daneil Dolin, MD;  Location: [facility];  Service: Endoscopy;;  sigmoid, ascending, cecal     OB History   No obstetric history on file.     Family History  Problem Relation Age of Onset  . Colon cancer Father        89s  . Hypertension Father   . Lung cancer Sister        stage IV    Social History   Tobacco Use  . Smoking status: Current Every Day Smoker    Packs/day: 0.50    Years: 45.00    Pack years: 22.50    Types: Cigarettes  . Smokeless tobacco: Never Used  . Tobacco comment: 10-15 cigarettes per day 07/15/2020  Vaping Use  . Vaping Use: Never used  Substance Use Topics  . Alcohol use: No  . Drug use: No    Home Medications Prior to Admission medications   Medication Sig Start Date End Date Taking? Authorizing Provider  albuterol (PROVENTIL) (2.5 MG/3ML) 0.083% nebulizer solution Take 3 mLs (2.5 mg total) by nebulization every 4 (four) hours as needed for wheezing or shortness of breath. Patient taking differently: Take 2.5 mg by nebulization 3 (three) times daily.  12/19/18   [name], MD  albuterol (VENTOLIN HFA) 108 (90 Base) MCG/ACT inhaler INHALE 2 PUFFS INTO THE LUNGS EVERY 4 HOURS AS NEEDED FOR WHEEZING ORSHORTNESS OF BREATH. Patient taking differently: Inhale 2 puffs into the lungs every 4 (four) hours as needed for wheezing or shortness of breath. INHALE 2 PUFFS INTO THE LUNGS EVERY 4 HOURS AS NEEDED FOR WHEEZING ORSHORTNESS OF BREATH. 02/14/17   Kathyrn Drown, MD  ALPRAZolam Duanne Moron) 0.5 MG tablet Take 1 tablet (0.5 mg total) by mouth every 6 (six) hours as needed for anxiety. 03/12/20   Manuella Ghazi, Pratik D, DO  budesonide (PULMICORT) 0.5  MG/2ML nebulizer solution Take 2 mLs (0.5 mg total) by nebulization in the morning and at bedtime. 06/30/20   Chesley Mires, MD  feeding supplement, ENSURE ENLIVE, (ENSURE ENLIVE) LIQD Take 237 mLs by mouth 2 (two) times daily between meals. 03/23/19   Lavina Hamman, MD  FLUoxetine (PROZAC) 40 MG capsule Take 40 mg by mouth daily.    [provider]  formoterol (PERFOROMIST) 20 MCG/2ML nebulizer solution Take 2 mLs (20 mcg total) by nebulization 2 (two) times daily. 06/30/20   Chesley Mires, MD  guaiFENesin (MUCINEX) 600 MG 12 hr tablet Take 1 tablet (600 mg total) by mouth 2 (two) times daily. Patient taking differently: Take 600 mg by mouth daily.  12/19/18   [name], MD  lisinopril-hydrochlorothiazide (PRINZIDE,ZESTORETIC) 10-12.5 MG tablet Take 1 tablet by mouth daily. 01/02/18   [provider]  Multiple Vitamin (MULTIVITAMIN WITH MINERALS) TABS tablet Take 1 tablet by mouth daily. 10/07/19   [name], MD  omeprazole (PRILOSEC) 20 MG capsule Take 1 capsule (20 mg total) by mouth daily. Patient taking differently: Take 20 mg by mouth daily as needed.  08/18/16 08/18/20  Kathyrn Drown, MD  oxyCODONE-acetaminophen (PERCOCET) 7.5-325 MG tablet Take 1 tablet by mouth 4 (four) times daily as needed. 09/20/19   [provider]  pregabalin (LYRICA) 200 MG capsule Take 200 mg by mouth 2 (two) times daily. 09/20/19   [provider]  QUEtiapine (SEROQUEL) 50 MG tablet Take 50 mg by mouth daily. 02/24/20   [provider]  traZODone (DESYREL) 50 MG tablet Take 1 tablet (50 mg total) by mouth at bedtime. Patient taking differently: Take 50 mg by mouth at bedtime as needed.  02/14/17   Kathyrn Drown, MD    Allergies    Patient has no known allergies.  Review of Systems   Review of Systems  Constitutional: Negative for fever.  Gastrointestinal: Negative for nausea and vomiting.  Musculoskeletal: Positive for arthralgias and myalgias. Negative  for back pain, gait problem and neck pain.  Skin: Positive for color change. Negative for rash and wound.  Neurological: Negative for dizziness, weakness, numbness and headaches.  Hematological: Does not bruise/bleed easily.  Psychiatric/Behavioral: Negative for confusion.  All other systems reviewed and are negative.   Physical Exam Updated Vital Signs BP (!) 130/55 (BP Location: Right Arm)   Pulse 80   Temp 98.2 F (36.8 C) (Oral)   Resp 16   Ht 5\' 5"  (1.651 m)   Wt 50.3 kg   SpO2 100%   BMI 18.45 kg/m   Physical Exam Vitals and nursing note reviewed.  Constitutional:      General: She is not in acute distress.    Appearance: She is well-developed and well-nourished. She is not diaphoretic.  HENT:     Head: Normocephalic and atraumatic.  Cardiovascular:     Pulses: Normal pulses.  Pulmonary:     Effort: Pulmonary effort is normal.  Musculoskeletal:        General: Swelling and tenderness present. No deformity.     Left upper arm: Swelling, tenderness and bony tenderness present. No deformity.     Right wrist: No swelling, deformity or tenderness. Normal range of motion.     Left wrist: Tenderness present. No swelling or deformity. Normal range of motion.     Cervical back: Normal range of motion and neck supple. No tenderness or bony tenderness.     Comments: Brace on right leg. No pain with ROM lower extremities.   Skin:    General: Skin is warm and dry.     Findings: Bruising present.  Neurological:     Mental Status: She is alert and oriented to person, place, and time.     Sensory: No sensory deficit.     Motor: No weakness.  Psychiatric:        Mood and Affect: Mood and affect normal.        Behavior: Behavior normal.     ED Results / Procedures / Treatments   Labs (all labs ordered are listed, but only abnormal results are displayed) Labs Reviewed - No data to display  EKG None  Radiology DG Wrist Complete Left  Result Date: 09/24/2020 CLINICAL  DATA:  69 year old female with fall and trauma to the left upper extremity. EXAM: LEFT HUMERUS - 2+ VIEW; LEFT WRIST - COMPLETE 3+ VIEW COMPARISON:  None. FINDINGS: There is a displaced and impacted fracture of the humeral neck. No other acute fracture  identified. The bones are osteopenic. There is no dislocation. There is degenerative changes of the left shoulder. The soft tissues are unremarkable. IMPRESSION: Displaced and impacted fracture of the humeral neck. Electronically Signed   By: Anner [place] M.D.   On: 09/24/2020 22:24   DG Humerus Left  Result Date: 09/24/2020 CLINICAL DATA:  69 year old female with fall and trauma to the left upper extremity. EXAM: LEFT HUMERUS - 2+ VIEW; LEFT WRIST - COMPLETE 3+ VIEW COMPARISON:  None. FINDINGS: There is a displaced and impacted fracture of the humeral neck. No other acute fracture identified. The bones are osteopenic. There is no dislocation. There is degenerative changes of the left shoulder. The soft tissues are unremarkable. IMPRESSION: Displaced and impacted fracture of the humeral neck. Electronically Signed   By: Anner [place] M.D.   On: 09/24/2020 22:24    Procedures Procedures (including critical care time)  Medications Ordered in ED Medications  HYDROcodone-acetaminophen (NORCO/VICODIN) 5-325 MG per tablet 1 tablet (1 tablet Oral Given 09/24/20 2158)    ED Course  I have reviewed the triage vital signs and the nursing notes.  Pertinent labs & imaging results that were available during my care of the patient were reviewed by me and considered in my medical decision making (see chart for details).  Clinical Course as of 09/24/20 2232  Fri Sep 24, 7344  [id number] 69 year old female brought in by EMS after a fall yesterday, tripped on her oxygen tubing landing on her left arm.  X-ray shows an impacted displaced left humeral neck fracture.  X-ray of the wrist is unremarkable.  Patient was placed in a shoulder immobilizer.  Patient filled  a prescription for narcotic pain medication yesterday, advised to take this for her pain and follow-up with orthopedics. [LM]    Clinical Course User Index [LM] Roque Lias   MDM Rules/Calculators/A&P                          Final Clinical Impression(s) / ED Diagnoses Final diagnoses:  Fall, initial encounter  Closed displaced fracture of surgical neck of left humerus, unspecified fracture morphology, initial encounter    Rx / DC Orders ED Discharge Orders    None       Tacy Learn, PA-C 09/24/20 2232    Veryl Speak, MD 09/28/20 1557

## 2020-09-24 NOTE — ED Triage Notes (Signed)
Patient complains of ground level fall 1 day ago.  States she was walking in her kitchen when she tripped over her oxygen tubing.  Complains of pain and bruising to L shoulder and upper arm.

## 2020-10-05 DIAGNOSIS — Z0000 Encounter for general adult medical examination without abnormal findings: Secondary | ICD-10-CM | POA: Diagnosis not present

## 2020-10-05 DIAGNOSIS — J449 Chronic obstructive pulmonary disease, unspecified: Secondary | ICD-10-CM | POA: Diagnosis not present

## 2020-10-05 DIAGNOSIS — Z681 Body mass index (BMI) 19 or less, adult: Secondary | ICD-10-CM | POA: Diagnosis not present

## 2020-10-05 DIAGNOSIS — S42302A Unspecified fracture of shaft of humerus, left arm, initial encounter for closed fracture: Secondary | ICD-10-CM | POA: Diagnosis not present

## 2020-10-07 ENCOUNTER — Encounter: Payer: Self-pay | Admitting: Orthopedic Surgery

## 2020-10-07 ENCOUNTER — Ambulatory Visit: Payer: Medicare Other

## 2020-10-07 ENCOUNTER — Other Ambulatory Visit: Payer: Self-pay

## 2020-10-07 ENCOUNTER — Ambulatory Visit (INDEPENDENT_AMBULATORY_CARE_PROVIDER_SITE_OTHER): Payer: Medicare Other | Admitting: Orthopedic Surgery

## 2020-10-07 VITALS — BP 117/56 | HR 100 | Ht 65.0 in | Wt 111.0 lb

## 2020-10-07 DIAGNOSIS — W010XXA Fall on same level from slipping, tripping and stumbling without subsequent striking against object, initial encounter: Secondary | ICD-10-CM | POA: Diagnosis not present

## 2020-10-07 DIAGNOSIS — M25512 Pain in left shoulder: Secondary | ICD-10-CM | POA: Diagnosis not present

## 2020-10-07 DIAGNOSIS — S42295A Other nondisplaced fracture of upper end of left humerus, initial encounter for closed fracture: Secondary | ICD-10-CM

## 2020-10-07 MED ORDER — OXYCODONE-ACETAMINOPHEN 10-325 MG PO TABS: 1.0000 | ORAL_TABLET | ORAL | 0 refills | Status: DC | PRN

## 2020-10-07 NOTE — Progress Notes (Signed)
Amy Rubio  10/07/2020  Body mass index is 18.47 kg/m.  ASSESSMENT AND PLAN:     Left proximal humerus fracture in a patient is on oxygen and has chronic pain from post polio syndrome usually takes Percocet 7.5 mg q. 120 a month from Dr. Riley Kill  Not a surgical candidate  2 weeks post injury x-rays show stable fracture pattern   Fu 4 weeks xray    Encounter Diagnoses  Name Primary?  . Acute pain of left shoulder Yes  . Other closed nondisplaced fracture of proximal end of left humerus, initial encounter     Imaging Outside imaging included wrist, left wrist x-ray 3 views no fracture  Left humerus proximal humerus fracture 2 part surgical neck  Repeat x-rays 2 weeks after injury fracture is stable with minimal displacement and minimal angulation  Note patient is not having good pain control due to opioid tolerance  Even try Dilaudid 4 mg  Soft switch her to Percocet 10 mg which worked well after her hip surgery  I will alert Dr. Gerarda Fraction so that she does not break her pain contract  Meds ordered this encounter  Medications  . oxyCODONE-acetaminophen (PERCOCET) 10-325 MG tablet    Sig: Take 1 tablet by mouth every 4 (four) hours as needed for up to 7 days for pain.    Dispense:  42 tablet    Refill:  0      HISTORY SECTION :  Chief Complaint  Patient presents with  . Shoulder Injury    DOI 09/24/20, Patient reports 10/10 today.    HPI The patient presents for evaluation of severe pain left shoulder since December 24 when she fell after tripping over her oxygen tubing  She has had significant difficulty with pain management secondary to opioid tolerance due to chronic opioid therapy for chronic post polio pain syndrome    Review of Systems  Respiratory: Positive for shortness of breath.   Cardiovascular: Negative for chest pain.  Neurological: Negative for tingling.     has a past medical history of Adenomatous colon polyp (2007), Anxiety, Arthritis,  Chronic pain, COPD (chronic obstructive pulmonary disease) ([place]), Depression, Dyspnea, Esophageal stricture (07/2009), Fibromyalgia, GERD (gastroesophageal reflux disease), Headache(784.0), Heart murmur, Hypertension, Osteoporosis, Polio, and Post poliomyelitis syndrome.   Past Surgical History:  Procedure Laterality Date  . ABDOMINAL HYSTERECTOMY     PARTIAL  . CATARACT EXTRACTION W/PHACO Left 01/26/2015   Procedure: CATARACT EXTRACTION PHACO AND INTRAOCULAR LENS PLACEMENT (IOC);  Surgeon: Rutherford Guys, MD;  Location: [facility];  Service: Ophthalmology;  Laterality: Left;  CDE:7.72  . CATARACT EXTRACTION W/PHACO Right 02/23/2015   Procedure: CATARACT EXTRACTION PHACO AND INTRAOCULAR LENS PLACEMENT (IOC);  Surgeon: Rutherford Guys, MD;  Location: [facility];  Service: Ophthalmology;  Laterality: Right;  CDE:10.68  . COLONOSCOPY  2007   adenoma (1.3cm). multiple small polyps ablated, diverticulosis  . COLONOSCOPY WITH PROPOFOL N/A 03/25/2018   Procedure: COLONOSCOPY WITH PROPOFOL;  Surgeon: Daneil Dolin, MD;  [facility];  Service: Endoscopy;  Laterality: N/A;  9:15am  . ESOPHAGOGASTRODUODENOSCOPY  07/23/09   distal thickened GEJ, peptic stricture narrowed lumen to 30mm, small hh, moderate gastritis (no H.Pylori), mild duodenitis.   Marland Kitchen ESOPHAGOGASTRODUODENOSCOPY  2013   Dr. Gala Romney: Possible occult cervical esophageal web status post dilation, small hiatal hernia.  Marland Kitchen HIP PINNING,CANNULATED Left 07/15/2017   Procedure: CANNULATED HIP PINNING;  Surgeon: Carole Civil, MD;  Location: [facility];  Service: Orthopedics;  Laterality: Left;  Marland Kitchen MICROLARYNGOSCOPY N/A  07/08/2019   Procedure: MICRO DIRECT LARYNGOSOCPY WITH EXCISION VOCAL CORD MASS;  Surgeon: Leta Baptist, MD;  Location: [place];  Service: ENT;  Laterality: N/A;  . PARTIAL HYSTERECTOMY    . POLYPECTOMY  03/25/2018   Procedure: POLYPECTOMY;  Surgeon: Daneil Dolin, MD;  [facility];  Service: Endoscopy;;  sigmoid, ascending, cecal     Social History   Tobacco Use  . Smoking status: Current Every Day Smoker    Packs/day: 0.50    Years: 45.00    Pack years: 22.50    Types: Cigarettes  . Smokeless tobacco: Never Used  . Tobacco comment: 10-15 cigarettes per day 07/15/2020  Vaping Use  . Vaping Use: Never used  Substance Use Topics  . Alcohol use: No  . Drug use: No    Family History  Problem Relation Age of Onset  . Colon cancer Father        41s  . Hypertension Father   . Lung cancer Sister        stage IV      No Known Allergies   Current Outpatient Medications:  .  albuterol (PROVENTIL) (2.5 MG/3ML) 0.083% nebulizer solution, Take 3 mLs (2.5 mg total) by nebulization every 4 (four) hours as needed for wheezing or shortness of breath. (Patient taking differently: Take 2.5 mg by nebulization 3 (three) times daily.), Disp: 75 mL, Rfl: 12 .  albuterol (VENTOLIN HFA) 108 (90 Base) MCG/ACT inhaler, INHALE 2 PUFFS INTO THE LUNGS EVERY 4 HOURS AS NEEDED FOR WHEEZING ORSHORTNESS OF BREATH. (Patient taking differently: Inhale 2 puffs into the lungs every 4 (four) hours as needed for wheezing or shortness of breath. INHALE 2 PUFFS INTO THE LUNGS EVERY 4 HOURS AS NEEDED FOR WHEEZING ORSHORTNESS OF BREATH.), Disp: 18 g, Rfl: 5 .  ALPRAZolam (XANAX) 0.5 MG tablet, Take 1 tablet (0.5 mg total) by mouth every 6 (six) hours as needed for anxiety., Disp: 20 tablet, Rfl: 0 .  budesonide (PULMICORT) 0.5 MG/2ML nebulizer solution, Take 2 mLs (0.5 mg total) by nebulization in the morning and at bedtime., Disp: 120 mL, Rfl: 5 .  feeding supplement, ENSURE ENLIVE, (ENSURE ENLIVE) LIQD, Take 237 mLs by mouth 2 (two) times daily between meals., Disp: 237 mL, Rfl: 12 .  FLUoxetine (PROZAC) 40 MG capsule, Take 40 mg by mouth daily., Disp: , Rfl:  .  formoterol (PERFOROMIST) 20 MCG/2ML nebulizer solution, Take 2 mLs (20 mcg total) by nebulization 2 (two) times daily., Disp: 120 mL, Rfl: 5 .  guaiFENesin (MUCINEX) 600 MG 12 hr  tablet, Take 1 tablet (600 mg total) by mouth 2 (two) times daily. (Patient taking differently: Take 600 mg by mouth daily.), Disp: 30 tablet, Rfl: 0 .  lisinopril-hydrochlorothiazide (PRINZIDE,ZESTORETIC) 10-12.5 MG tablet, Take 1 tablet by mouth daily., Disp: , Rfl:  .  Multiple Vitamin (MULTIVITAMIN WITH MINERALS) TABS tablet, Take 1 tablet by mouth daily., Disp:  , Rfl:  .  oxyCODONE-acetaminophen (PERCOCET) 10-325 MG tablet, Take 1 tablet by mouth every 4 (four) hours as needed for up to 7 days for pain., Disp: 42 tablet, Rfl: 0 .  pregabalin (LYRICA) 200 MG capsule, Take 200 mg by mouth 2 (two) times daily., Disp: , Rfl:  .  QUEtiapine (SEROQUEL) 50 MG tablet, Take 50 mg by mouth daily., Disp: , Rfl:  .  traZODone (DESYREL) 50 MG tablet, Take 1 tablet (50 mg total) by mouth at bedtime. (Patient taking differently: Take 50 mg by mouth at bedtime as needed.), Disp:  30 tablet, Rfl: 12 .  omeprazole (PRILOSEC) 20 MG capsule, Take 1 capsule (20 mg total) by mouth daily. (Patient taking differently: Take 20 mg by mouth daily as needed. ), Disp: 30 capsule, Rfl: 12   PHYSICAL EXAM SECTION: BP (!) 117/56   Pulse 100   Ht 5\' 5"  (1.651 m)   Wt 111 lb (50.3 kg)   BMI 18.47 kg/m   Body mass index is 18.47 kg/m.   General appearance: Well-developed well-nourished no gross deformities body habitus is ectomorphic  Eyes clear normal vision no evidence of conjunctivitis or jaundice, extraocular muscles intact  ENT: ears hearing normal, nasal passages clear, throat clear   Lymph nodes: No lymphadenopathy  Neck is supple without palpable mass, full range of motion  Cardiovascular normal pulse and perfusion in left and right upper extremity  Neurologically normal sensation left upper extremity  Psychological: Awake alert and oriented x3 mood and affect normal  Skin no lacerations or ulcerations no nodularity no palpable masses, no erythema or nodularity  Musculoskeletal: Pain without  deformity with tenderness left shoulder elbow and hand working normally 4:41 PM

## 2020-10-08 ENCOUNTER — Telehealth: Payer: Self-pay | Admitting: Orthopedic Surgery

## 2020-10-08 NOTE — Telephone Encounter (Signed)
Yes, it is in the chart, I called to discuss She told us about the Percocet got #120 09/23/20 Dilaudid 4mg  #40 on 10/05/20 from Rowan Blase given after the fracture I told them I do not think Dr Aline Brochure was aware of the Dilaudid prescription.   I told her not to fill the Percocet from Dr Aline Brochure and let Dr Gerarda Fraction and Rowan Blase (both at [facility]) handle her pain meds the dilaudid and Percocet they gave should be enough

## 2020-10-08 NOTE — Telephone Encounter (Signed)
The patient received Percocet 120 tablets/month for post polio pain syndrome.  She fractured her proximal humerus on 23 December and took 2 tablets instead of 1 for the increase in pain  She called her primary care doctor who put her on Dilaudid which did not help  When she presented to she was still having severe pain and we put her on Percocet 10 instead of 7.5 mg

## 2020-10-08 NOTE — Telephone Encounter (Signed)
Beth from [place] called and left message stating that Ms. Manganello brought in a prescription from Dr. Aline Brochure for [place].  Beth wanted to know if Dr. Aline Brochure is aware that Ms. Hilbun also gets other medications.  She also wanted to know if Dr. Aline Brochure wants them to fill this prescription for this patient.  Could you please advise Beth at [place]?  Thanks

## 2020-10-18 ENCOUNTER — Other Ambulatory Visit: Payer: Self-pay | Admitting: Orthopedic Surgery

## 2020-10-18 DIAGNOSIS — M25512 Pain in left shoulder: Secondary | ICD-10-CM

## 2020-10-18 NOTE — Telephone Encounter (Signed)
Rx request 

## 2020-10-25 ENCOUNTER — Other Ambulatory Visit: Payer: Self-pay | Admitting: Orthopedic Surgery

## 2020-10-25 DIAGNOSIS — M25512 Pain in left shoulder: Secondary | ICD-10-CM

## 2020-10-25 NOTE — Telephone Encounter (Signed)
Rx request 

## 2020-10-26 NOTE — Telephone Encounter (Signed)
Patient called again today requesting a refill for her pain medicine she left message on answering machine,   oxyCODONE-acetaminophen (PERCOCET) 10-325 MG tablet

## 2020-10-29 DIAGNOSIS — S42202D Unspecified fracture of upper end of left humerus, subsequent encounter for fracture with routine healing: Secondary | ICD-10-CM | POA: Insufficient documentation

## 2020-11-01 ENCOUNTER — Other Ambulatory Visit: Payer: Self-pay | Admitting: Orthopedic Surgery

## 2020-11-01 ENCOUNTER — Telehealth: Payer: Self-pay | Admitting: Radiology

## 2020-11-01 DIAGNOSIS — M25512 Pain in left shoulder: Secondary | ICD-10-CM

## 2020-11-01 NOTE — Telephone Encounter (Signed)
FYI Patient called, said she has no pain meds, and has not been getting meds from anyone but Dr Aline Brochure and asks for the refill.  She is aware Dr Aline Brochure denied the request today, and that she has an appt on Friday.  I told her I would send this message but she would likely need to just discuss with him on Friday at her appt.

## 2020-11-04 ENCOUNTER — Ambulatory Visit (INDEPENDENT_AMBULATORY_CARE_PROVIDER_SITE_OTHER): Payer: Medicare Other | Admitting: Orthopedic Surgery

## 2020-11-04 ENCOUNTER — Other Ambulatory Visit: Payer: Self-pay

## 2020-11-04 ENCOUNTER — Ambulatory Visit: Payer: Medicare Other

## 2020-11-04 VITALS — BP 180/85 | HR 100 | Ht 65.0 in

## 2020-11-04 DIAGNOSIS — S42295D Other nondisplaced fracture of upper end of left humerus, subsequent encounter for fracture with routine healing: Secondary | ICD-10-CM

## 2020-11-04 DIAGNOSIS — M25512 Pain in left shoulder: Secondary | ICD-10-CM

## 2020-11-04 MED ORDER — OXYCODONE-ACETAMINOPHEN 7.5-325 MG PO TABS: 1.0000 | ORAL_TABLET | ORAL | 0 refills | Status: AC | PRN

## 2020-11-04 NOTE — Progress Notes (Addendum)
Chief Complaint  Patient presents with  . Shoulder Injury    Left shoulder pain,    Encounter Diagnoses  Name Primary?  . Other closed nondisplaced fracture of proximal end of left humerus with routine healing, subsequent encounter 09/24/20 Yes  . Acute pain of left shoulder    DOI12/24/21   70 year old female with left proximal humerus fracture here for follow-up x-ray  She has follow-up with Dr. Riley Kill for post polio syndrome causing chronic pain managed with Percocet 7.5 mg 120 tablets/month  I placed her on 10 mg every 4  X-ray shows healed proximal humerus fracture   Healing indicates appropriate to start physical therapy  6 weeks PT then fu 8 weeks   Encounter Diagnoses  Name Primary?  . Other closed nondisplaced fracture of proximal end of left humerus with routine healing, subsequent encounter 09/24/20 Yes  . Acute pain of left shoulder    Meds ordered this encounter  Medications  . oxyCODONE-acetaminophen (PERCOCET) 7.5-325 MG tablet    Sig: Take 1 tablet by mouth every 4 (four) hours as needed for up to 7 days for severe pain.    Dispense:  42 tablet    Refill:  0   Addendum:   The patient says Dr Gerarda Fraction will not refill her meds and dropped her as a patient because she received opioid meds from me  after her fracture.  So I m writing an Rx for her for 1 week  I ve called his office 2x and we ve been on hold for an excessive amount of time   Expected standard is that a px will be cared for for 30 days after a Dr decides to terminate care .

## 2020-11-05 DIAGNOSIS — J449 Chronic obstructive pulmonary disease, unspecified: Secondary | ICD-10-CM | POA: Diagnosis not present

## 2020-11-16 ENCOUNTER — Telehealth: Payer: Self-pay | Admitting: Pulmonary Disease

## 2020-11-16 NOTE — Telephone Encounter (Signed)
Called and spoke with pt and she stated that she is having increased SHOB with activity even with her oxygen on. Scheduled appt with SG for tomorrow afternoon.    Pt stated that she wanted to see if [facility] could help her out with getting her xanax and her pain meds since her PCP no longer will see her.  She said that the ortho doctor gave her pain meds for her shoulder and she had gotten pain meds from her PCP and he will no longer see her now.  I advised her that [facility] would not be able to refill those types of medications for her and that she would have to set up with a new PCP or pain clinic for the pain meds.  Pt voiced her understanding.

## 2020-11-17 ENCOUNTER — Encounter: Payer: Self-pay | Admitting: Acute Care

## 2020-11-17 ENCOUNTER — Ambulatory Visit: Payer: Medicare Other

## 2020-11-17 ENCOUNTER — Ambulatory Visit (INDEPENDENT_AMBULATORY_CARE_PROVIDER_SITE_OTHER): Payer: Medicare Other | Admitting: Acute Care

## 2020-11-17 ENCOUNTER — Other Ambulatory Visit: Payer: Self-pay

## 2020-11-17 DIAGNOSIS — J441 Chronic obstructive pulmonary disease with (acute) exacerbation: Secondary | ICD-10-CM | POA: Diagnosis not present

## 2020-11-17 MED ORDER — PREDNISONE 10 MG (21) PO TBPK: ORAL_TABLET | Freq: Every day | ORAL | 20 refills | Status: AC

## 2020-11-17 MED ORDER — GUAIFENESIN ER 600 MG PO TB12: 600.0000 mg | ORAL_TABLET | Freq: Two times a day (BID) | ORAL | 3 refills | Status: AC

## 2020-11-17 MED ORDER — ALBUTEROL SULFATE HFA 108 (90 BASE) MCG/ACT IN AERS: 2.0000 | INHALATION_SPRAY | RESPIRATORY_TRACT | 3 refills | Status: AC | PRN

## 2020-11-17 MED ORDER — DOXYCYCLINE HYCLATE 100 MG PO TABS: 100.0000 mg | ORAL_TABLET | Freq: Two times a day (BID) | ORAL | 0 refills | Status: AC

## 2020-11-17 NOTE — Progress Notes (Signed)
Virtual Visit via Telephone Note  I connected with Amy Rubio on 11/17/20 at  3:00 PM EST by telephone and verified that I am speaking with the correct person using two identifiers.  Location: Patient: In her car outside  Amy Rubio Provider: [facility], [place], [place], [address]   I discussed the limitations, risks, security and privacy concerns of performing an evaluation and management service by telephone and the availability of in person appointments. I also discussed with the patient that there may be a patient responsible charge related to this service. The patient expressed understanding and agreed to proceed.  Synopsis Amy Rubio is a 70 y.o. 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 Halford Chessman  History of Present Illness: Pt. Presented to the wrong office. She had an appointment at the [facility] office with me, and she presented to the [place] office instead. She states she is too weak to drive to [place]. We checked with the [place] office and they did not have any openings or options for her to be seen. Additionally they do not have any CXR options at the [place] office.As she was unable to be seen in [place], and the patient refused to go to the ED as I advised her, we agreed to a tele visit.   Pt has a 2 week history of worsening shortness of breath, and increased need for rescue medications. She is using her albuterol nebs up to 4 times a day. She is using her Performist and Pulmicort BID.  She states she is wearing her oxygen at 4 L .She has increased wheezing, and she is unable to cough up secretions she has in her chest. She can feel them there, but she cannot cough them up.   She denies any sick contacts. She did a home Covid test and it was negative.  She denies fever. She states she is too weak to even fix food for herself. At this  point I advised her to go to the ED to be admitted, but she refused. States the last time she was there the physician told her she needed Hospice. We discussed if she was unable to care for herself and she is sick, she needs inpatient admission. Again, she refused.  I had her check an oxygen saturation, which was 95%. She is still smoking about 6 cigarettes daily. She is not taking anything OTC to manage her symptoms.  At this point, as she refused admission, I will treat her for a COPD flare. I have told her that she must go to [facility] for a CXR tomorrow. We will send in Antibiotics and prednisone. I have asked her to take Mucinex 1200 mg with a full glass of water each day, and to use her Flutter valve 4-5 breaths 3 times a day. I told her this will make her cough, and that is the goal to get secretions out of her airways and chest. We have sent her into the [place] office for Mucinex samples, as she said she has no money for OTC medications. She said prescription meds are paid for through [facility]. We will also sent in a RX for Mucinex to see if this will also be covered for free.  Pt was drug seeking today. 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 Apparently she did get some Dilaudid from her  orthopedic MD. She did not let her PCP know and she was still being prescribed her Oxy.  She last filled her Oxy/ Acetaminophen 11/04/2020.  ( 42 tablets).  PDMP review completed.  Narcotic Score 600 Sedative score 550 Overall risk score 330/999 State indicator>> Pt has > 5 opioid or sedative providers in any year for the last 2 years.  No narcotic medications of pain medications prescribed by this provider on this date.    Observations/Objective: Pulmonary testing:   PFT 04/28/20 >> FEV1 0.72 (30%), FEV15 33, DLCO 25%, +BD  Chest Imaging:   CT chest 04/28/16 >> moderate centrilobular emphysema, mild BTX  Refusing to go to the ED after multiple  recommendations.   Assessment and Plan: Acute COPD Flare Refused to go to ED as was advised. Plan  We recommended that you go to the ED at [facility]. Since you have refused to do this, we will treat you for a COPD Flare We will prescribe doxycycline 100 mg twice daily ( antibiotic) Take for 7 days  Avoid direct sun while on antibiotic. Take probiotic or eat Yogurt while on antibiotic , and for 10 days after treatment is complete.  We will send in a prescription for Prednisone taper; 10 mg tablets: 4 tabs x 2 days, 3 tabs x 2 days, 2 tabs x 2 days 1 tab x 2 days then stop. CXR 2/17 at [facility]. We will call you with results. Mucinex 1200 mg daily with a full glass of water Use flutter valve 4-5 breaths three times daily. Robitussin for cough. Oxygen at 4 L as you have been doing. Saturation goals are 88-92% Continue  Performist and Pulmicort twice daily without fail.  Continue albuterol as needed for breakthrough shortness of breath or wheezing.  Follow up with Dr. Melvyn Novas in [place] office 12/07/2020. Please contact office for sooner follow up if symptoms do not improve or worsen or seek emergency care   Follow Up Instructions: Follow up with Dr. Melvyn Novas in [place] office 12/07/2020. Please contact office for sooner follow up if symptoms do not improve or worsen or seek emergency care    I discussed the assessment and treatment plan with the patient. The patient was provided an opportunity to ask questions and all were answered. The patient agreed with the plan and demonstrated an understanding of the instructions.   The patient was advised to call back or seek an in-person evaluation if the symptoms worsen or if the condition fails to improve as anticipated.  I provided 60  minutes of non-face-to-face time during this encounter.   Amy Spatz, NP 11/17/2020 4:16 PM

## 2020-11-17 NOTE — Patient Instructions (Addendum)
It is good to talk with  you today. We recommended that you go to the ED at [facility]. Since you have refused to do this, we will treat you for a COPD Flare We will prescribe doxycycline 100 mg twice daily ( antibiotic) Take for 7 days  Avoid direct sun while on antibiotic. Take probiotic or eat Yogurt while on antibiotic , and for 10 days after treatment is complete.  We will send in a prescription for Prednisone taper; 10 mg tablets: 4 tabs x 2 days, 3 tabs x 2 days, 2 tabs x 2 days 1 tab x 2 days then stop. CXR 2/17 at [facility]. We will call you with results. Mucinex 1200 mg daily with a full glass of water Use flutter valve 4-5 breaths three times daily. Robitussin for cough. Oxygen at 4 L as you have been doing. Saturation goals are 88-92% Continue  Performist and Pulmicort twice daily without fail.  Continue albuterol as needed for breakthrough shortness of breath or wheezing.  Follow up with Dr. Melvyn Novas in [place] office 12/07/2020. Please contact office for sooner follow up if symptoms do not improve or worsen or seek emergency care

## 2020-11-17 NOTE — Progress Notes (Signed)
Reviewed and agree with assessment/plan.   Chesley Mires, MD [facility] Pulmonary/Critical Care 11/17/2020, 4:50 PM Pager:  [phone]

## 2020-11-18 ENCOUNTER — Other Ambulatory Visit: Payer: Self-pay

## 2020-11-18 ENCOUNTER — Ambulatory Visit (HOSPITAL_COMMUNITY)
Admission: RE | Admit: 2020-11-18 | Discharge: 2020-11-18 | Disposition: A | Discharge status: Home / Self Care | Payer: Medicare Other | Source: Ambulatory Visit | Attending: Acute Care | Admitting: Acute Care

## 2020-11-18 ENCOUNTER — Ambulatory Visit (HOSPITAL_COMMUNITY): Payer: Medicare Other | Attending: Orthopedic Surgery

## 2020-11-18 DIAGNOSIS — J441 Chronic obstructive pulmonary disease with (acute) exacerbation: Secondary | ICD-10-CM | POA: Insufficient documentation

## 2020-11-18 DIAGNOSIS — R0602 Shortness of breath: Secondary | ICD-10-CM | POA: Diagnosis not present

## 2020-11-24 ENCOUNTER — Encounter: Payer: Self-pay | Admitting: *Deleted

## 2020-11-24 NOTE — Progress Notes (Signed)
Please let patient know her CXR showed no acute findings. No edema or pneumonia. It does show areas of mild scarring that represent underlying bronchitis.

## 2020-11-25 ENCOUNTER — Ambulatory Visit: Payer: Medicare Other | Admitting: Internal Medicine

## 2020-11-30 DEATH — deceased

## 2020-12-07 ENCOUNTER — Ambulatory Visit: Payer: Medicare Other | Admitting: Internal Medicine

## 2020-12-30 ENCOUNTER — Ambulatory Visit: Payer: Medicare Other | Admitting: Orthopedic Surgery

## 2020-12-30 ENCOUNTER — Encounter: Payer: Self-pay | Admitting: Orthopedic Surgery

## 2021-03-21 ENCOUNTER — Encounter: Payer: Self-pay | Admitting: Internal Medicine

## 2024-05-06 NOTE — Progress Notes (Signed)
 This encounter was created in error - please disregard.
